# Patient Record
Sex: Female | Born: 1970
Health system: Southern US, Community
[De-identification: ages and names within clinical notes are randomized; demographics above are authoritative.]

## PROBLEM LIST (undated history)

## (undated) DIAGNOSIS — Z87442 Personal history of urinary calculi: Secondary | ICD-10-CM

## (undated) DIAGNOSIS — F419 Anxiety disorder, unspecified: Secondary | ICD-10-CM

## (undated) DIAGNOSIS — J189 Pneumonia, unspecified organism: Secondary | ICD-10-CM

## (undated) DIAGNOSIS — N200 Calculus of kidney: Secondary | ICD-10-CM

## (undated) DIAGNOSIS — G43909 Migraine, unspecified, not intractable, without status migrainosus: Secondary | ICD-10-CM

## (undated) DIAGNOSIS — K2210 Ulcer of esophagus without bleeding: Secondary | ICD-10-CM

## (undated) DIAGNOSIS — K449 Diaphragmatic hernia without obstruction or gangrene: Secondary | ICD-10-CM

## (undated) DIAGNOSIS — D649 Anemia, unspecified: Secondary | ICD-10-CM

## (undated) DIAGNOSIS — K589 Irritable bowel syndrome without diarrhea: Secondary | ICD-10-CM

## (undated) DIAGNOSIS — K649 Unspecified hemorrhoids: Secondary | ICD-10-CM

## (undated) DIAGNOSIS — J309 Allergic rhinitis, unspecified: Secondary | ICD-10-CM

## (undated) DIAGNOSIS — M797 Fibromyalgia: Secondary | ICD-10-CM

## (undated) DIAGNOSIS — F32A Depression, unspecified: Secondary | ICD-10-CM

## (undated) DIAGNOSIS — K311 Adult hypertrophic pyloric stenosis: Secondary | ICD-10-CM

## (undated) DIAGNOSIS — T17908A Unspecified foreign body in respiratory tract, part unspecified causing other injury, initial encounter: Secondary | ICD-10-CM

## (undated) DIAGNOSIS — S92001A Unspecified fracture of right calcaneus, initial encounter for closed fracture: Secondary | ICD-10-CM

## (undated) DIAGNOSIS — F429 Obsessive-compulsive disorder, unspecified: Secondary | ICD-10-CM

## (undated) DIAGNOSIS — N3020 Other chronic cystitis without hematuria: Secondary | ICD-10-CM

## (undated) DIAGNOSIS — J449 Chronic obstructive pulmonary disease, unspecified: Secondary | ICD-10-CM

## (undated) DIAGNOSIS — N3010 Interstitial cystitis (chronic) without hematuria: Secondary | ICD-10-CM

## (undated) DIAGNOSIS — K219 Gastro-esophageal reflux disease without esophagitis: Secondary | ICD-10-CM

## (undated) DIAGNOSIS — E039 Hypothyroidism, unspecified: Secondary | ICD-10-CM

## (undated) DIAGNOSIS — F329 Major depressive disorder, single episode, unspecified: Secondary | ICD-10-CM

## (undated) DIAGNOSIS — R4182 Altered mental status, unspecified: Secondary | ICD-10-CM

## (undated) DIAGNOSIS — G8929 Other chronic pain: Secondary | ICD-10-CM

## (undated) DIAGNOSIS — E559 Vitamin D deficiency, unspecified: Secondary | ICD-10-CM

## (undated) DIAGNOSIS — M791 Myalgia, unspecified site: Secondary | ICD-10-CM

## (undated) DIAGNOSIS — D509 Iron deficiency anemia, unspecified: Secondary | ICD-10-CM

## (undated) DIAGNOSIS — K259 Gastric ulcer, unspecified as acute or chronic, without hemorrhage or perforation: Secondary | ICD-10-CM

## (undated) DIAGNOSIS — K648 Other hemorrhoids: Secondary | ICD-10-CM

## (undated) HISTORY — DX: Other chronic cystitis without hematuria: N30.20

## (undated) HISTORY — DX: Calculus of kidney: N20.0

## (undated) HISTORY — PX: PARTIAL GASTRECTOMY: SHX2172

## (undated) HISTORY — PX: OTHER SURGICAL HISTORY: SHX169

## (undated) HISTORY — DX: Gastric ulcer, unspecified as acute or chronic, without hemorrhage or perforation: K25.9

## (undated) HISTORY — DX: Gastro-esophageal reflux disease without esophagitis: K21.9

## (undated) HISTORY — DX: Hypothyroidism, unspecified: E03.9

## (undated) HISTORY — DX: Unspecified hemorrhoids: K64.9

## (undated) HISTORY — DX: Major depressive disorder, single episode, unspecified: F32.9

## (undated) HISTORY — DX: Myalgia, unspecified site: M79.10

## (undated) HISTORY — DX: Other hemorrhoids: K64.8

## (undated) HISTORY — DX: Depression, unspecified: F32.A

## (undated) HISTORY — DX: Diaphragmatic hernia without obstruction or gangrene: K44.9

## (undated) HISTORY — DX: Obsessive-compulsive disorder, unspecified: F42.9

## (undated) HISTORY — PX: ABDOMINAL HYSTERECTOMY: SHX81

## (undated) HISTORY — DX: Anemia, unspecified: D64.9

## (undated) HISTORY — DX: Allergic rhinitis, unspecified: J30.9

## (undated) HISTORY — DX: Migraine, unspecified, not intractable, without status migrainosus: G43.909

## (undated) HISTORY — DX: Irritable bowel syndrome, unspecified: K58.9

## (undated) HISTORY — PX: FRACTURE SURGERY: SHX138

## (undated) HISTORY — DX: Iron deficiency anemia, unspecified: D50.9

## (undated) HISTORY — DX: Adult hypertrophic pyloric stenosis: K31.1

## (undated) HISTORY — DX: Vitamin D deficiency, unspecified: E55.9

## (undated) HISTORY — PX: EXTERNAL EAR SURGERY: SHX627

## (undated) HISTORY — DX: Anxiety disorder, unspecified: F41.9

## (undated) HISTORY — DX: Ulcer of esophagus without bleeding: K22.10

---

## 1998-02-14 ENCOUNTER — Emergency Department (HOSPITAL_COMMUNITY): Admission: EM | Admit: 1998-02-14 | Discharge: 1998-02-14 | Payer: Self-pay | Admitting: Emergency Medicine

## 1998-02-14 ENCOUNTER — Encounter: Payer: Self-pay | Admitting: Emergency Medicine

## 1998-09-03 ENCOUNTER — Other Ambulatory Visit: Admission: RE | Admit: 1998-09-03 | Discharge: 1998-09-03 | Payer: Self-pay | Admitting: *Deleted

## 1999-05-12 ENCOUNTER — Other Ambulatory Visit: Admission: RE | Admit: 1999-05-12 | Discharge: 1999-05-12 | Payer: Self-pay | Admitting: Family Medicine

## 1999-10-07 ENCOUNTER — Ambulatory Visit (HOSPITAL_COMMUNITY): Admission: RE | Admit: 1999-10-07 | Discharge: 1999-10-07 | Payer: Self-pay | Admitting: *Deleted

## 1999-10-07 ENCOUNTER — Encounter: Payer: Self-pay | Admitting: *Deleted

## 2000-02-01 ENCOUNTER — Emergency Department (HOSPITAL_COMMUNITY): Admission: EM | Admit: 2000-02-01 | Discharge: 2000-02-02 | Payer: Self-pay | Admitting: Emergency Medicine

## 2000-02-01 ENCOUNTER — Encounter: Payer: Self-pay | Admitting: Emergency Medicine

## 2000-02-03 ENCOUNTER — Emergency Department (HOSPITAL_COMMUNITY): Admission: EM | Admit: 2000-02-03 | Discharge: 2000-02-03 | Payer: Self-pay | Admitting: Emergency Medicine

## 2000-02-06 ENCOUNTER — Ambulatory Visit (HOSPITAL_COMMUNITY): Admission: RE | Admit: 2000-02-06 | Discharge: 2000-02-06 | Payer: Self-pay | Admitting: Urology

## 2003-01-24 ENCOUNTER — Other Ambulatory Visit: Admission: RE | Admit: 2003-01-24 | Discharge: 2003-01-24 | Payer: Self-pay | Admitting: Family Medicine

## 2004-08-13 ENCOUNTER — Ambulatory Visit: Payer: Self-pay | Admitting: Internal Medicine

## 2004-09-29 ENCOUNTER — Inpatient Hospital Stay (HOSPITAL_COMMUNITY): Admission: RE | Admit: 2004-09-29 | Discharge: 2004-09-30 | Payer: Self-pay | Admitting: Psychiatry

## 2004-09-29 ENCOUNTER — Ambulatory Visit: Payer: Self-pay | Admitting: Psychiatry

## 2004-10-01 ENCOUNTER — Other Ambulatory Visit (HOSPITAL_COMMUNITY): Admission: RE | Admit: 2004-10-01 | Discharge: 2004-12-30 | Payer: Self-pay | Admitting: Psychiatry

## 2005-04-08 ENCOUNTER — Ambulatory Visit (HOSPITAL_BASED_OUTPATIENT_CLINIC_OR_DEPARTMENT_OTHER): Admission: RE | Admit: 2005-04-08 | Discharge: 2005-04-08 | Payer: Self-pay | Admitting: Orthopedic Surgery

## 2007-03-07 ENCOUNTER — Emergency Department (HOSPITAL_COMMUNITY): Admission: EM | Admit: 2007-03-07 | Discharge: 2007-03-07 | Payer: Self-pay | Admitting: Emergency Medicine

## 2007-03-11 ENCOUNTER — Ambulatory Visit (HOSPITAL_COMMUNITY): Admission: RE | Admit: 2007-03-11 | Discharge: 2007-03-11 | Payer: Self-pay | Admitting: Urology

## 2007-10-24 ENCOUNTER — Ambulatory Visit (HOSPITAL_BASED_OUTPATIENT_CLINIC_OR_DEPARTMENT_OTHER): Admission: RE | Admit: 2007-10-24 | Discharge: 2007-10-24 | Payer: Self-pay | Admitting: Urology

## 2007-10-24 ENCOUNTER — Encounter (INDEPENDENT_AMBULATORY_CARE_PROVIDER_SITE_OTHER): Payer: Self-pay | Admitting: Urology

## 2010-05-27 NOTE — Op Note (Signed)
[name], [name]                ACCOUNT NO.:  [id number]   MEDICAL RECORD NO.:  [id number]          PATIENT TYPE:  AMB   LOCATION:  [facility]                         FACILITY:  [facility]   PHYSICIAN:  Mark C. Vernie Ammons, M.D.  DATE OF BIRTH:  10/15/1970   DATE OF PROCEDURE:  10/24/2007  DATE OF DISCHARGE:                               OPERATIVE REPORT   PREOPERATIVE DIAGNOSIS:  Rule out interstitial cystitis.   POSTOPERATIVE DIAGNOSIS:  Probable interstitial cystitis.   PROCEDURE:  1. Cystoscopy.  2. Hydrodistention.  3. Urethral dilatation.  4. Bladder biopsy.  5. Injection of local anesthetic/steroid.   SURGEON:  Mark C. Vernie Ammons, M.D.   ANESTHESIA:  General.   SPECIMENS:  Cold cup biopsies from the left wall and right bladder base  to pathology.   DRAINS:  None.   BLOOD LOSS:  Minimal.   COMPLICATIONS:  None.   INDICATIONS:  The patient is a 40 year old with symptoms suggesting  possible interstitial cystitis.  She has been having a lot of frequency  and bladder discomfort as well as discomfort with intercourse.  She had  had a previous evaluation for this condition with cystoscopy and bladder  biopsy by Dr. Harvin Hazel on December 23, 2006.  At that time his  cystoscopic examination indicated there were areas of denuded bladder  mucosa near the trigone with associated ulcerations.  These areas were  treated with a laser and cauterized as well as biopsied.  The biopsy  revealed what appeared to be fungal elements consistent with  candidiasis.  She was subsequently found to have a left distal ureteral  stone that was felt to be causing her irritation and after removal of  that stone, she seemed to improve but her symptoms have recurred and now  persist despite anticholinergic therapy.  We discussed further  evaluation under anesthesia as planned today.  The risks, complications  and alternatives were discussed.  She understands and has elected to  proceed.   DESCRIPTION OF  OPERATION:  After informed consent, the patient brought  to the major OR, placed on the table, administered general anesthesia  then moved to the dorsal lithotomy position.  Her genitalia and vagina  were sterilely prepped and draped and an official time-out was then  performed.  Initially the 22-French cystoscope sheath with 12 degrees  lens was inserted and the urethra was noted to be normal.  Upon entering  the bladder, I note the mucosa appeared entirely normal without evidence  of ulceration or areas of abnormality.  There was a shallow diverticulum  on the wall of the bladder on the right-hand side inferiorly.  The  bladder was fully inspected with both 12 and 70 degrees lenses and no  tumors, stones or inflammatory lesions were identified.  Ureteral  orifices were noted to be of normal configuration and position.   I then performed hydrodistention by filling the bladder with 100 cm  water pressure and then drained the bladder.  The bladder capacity was  noted to be 700 mL and the terminal effluent was bloody.  Reinspection  revealed significant glomerulations and petechial  hemorrhages involving  the lateral walls and floor of the bladder.  These were photographed..   I then removed the cystoscope and inserted the 24-French Foley catheter  and performed hydrodistention under 100 cm water pressure for 5 minutes  and then redrained the bladder.  This time the bladder was noted to have  held 800 mL.  Reinspection revealed no perforation or injury to the  bladder.  I therefore proceeded with bladder biopsies.   Cold cup biopsy forceps were used to obtain a biopsy from the left wall  and floor of the bladder just to the right of midline.  The biopsies  sites were then fulgurated with the Bugbee electrode and no bleeding was  identified.  The bladder was then drained and urethral dilatation was  then performed.  The urethra was dilated with female sounds to 32-French  without  difficulty.   I then instilled a solution of Pyridium, Marcaine and Kenalog in the  bladder.  10 mL of the Kenalog and 0.5% Marcaine mixture was then used  and injected in the subtrigonal region through the anterior wall of the  vagina in a systematic fashion.  It was also injected periurethrally and  at the bladder neck region.  Two rolled gauze sponges were then placed  in the bladder for tamponade purposes and the bladder was drained and  the patient was awakened and taken to recovery room in stable  satisfactory condition.  She tolerated procedure well and her vaginal  packing will be removed prior to discharge.   It is apparent that the patient most likely has interstitial cystitis in  its early form without decrease ilioinguinal nerve bladder capacity to a  significant degree at this time.  I therefore will initiate Elmiron  therapy at 100 mg taking 2 pills b.i.d. as well as Elavil 25 mg one  q.h.s. #30.  She was given Vicodin ES #24 and Pyridium 200 mg #24 to  take for the discomfort of the procedure and will follow-up in my office  in 3-4 weeks for reevaluation.      Mark C. Vernie Ammons, M.D.  Electronically Signed     MCO/MEDQ  D:  10/24/2007  T:  10/24/2007  Job:  [id number]

## 2010-05-27 NOTE — Op Note (Signed)
[name], [name]                ACCOUNT NO.:  [id number]   MEDICAL RECORD NO.:  [id number]          PATIENT TYPE:  AMB   LOCATION:  DAY                          FACILITY:  [facility]   PHYSICIAN:  Mark C. Vernie Ammons, M.D.  DATE OF BIRTH:  [birth year]   DATE OF PROCEDURE:  03/11/2007  DATE OF DISCHARGE:                               OPERATIVE REPORT   PREOPERATIVE DIAGNOSIS:  [name] and history of bladder  lesion.   POSTOPERATIVE DIAGNOSIS:  [name] and history of bladder  lesion.   PROCEDURE:  1. Cystoscopy.  2. Laura retrograde pyelogram with interpretation.  3. Laura ureteroscopy.  4. Ureteral [name] extraction.   SURGEON:  Mark C. Vernie Ammons, M.D.   ANESTHESIA:  General.   SPECIMEN:  [name] given to the patient.   DRAINS:  None.   BLOOD LOSS:  None.   COMPLICATIONS:  None.   INDICATIONS:  The patient is a 40 year old white female who has been  plagued by irritative voiding symptoms that were felt likely to be due  to what was initially diagnosed as a fungal cystitis by biopsy of the  bladder.  This was done elsewhere and she was treated with an antifungal  and noted some improvement but then continued to have significant  irritative voiding symptoms not improved with anticholinergics.  She  then developed severe Laura flank pain, presented to the emergency room  and was found to have a Laura distal ureteral calculus that was 2 mm in  size.  The patient has had a prior 3 mm calculus that would not pass and  required extraction in the past.  We therefore discussed ureteroscopic  extraction of this [name], its risks and complications as well as  alternatives.  She understands and would like to proceed with surgery.   DESCRIPTION OF OPERATION:  After informed consent, the patient was  brought to the main OR and placed on the table and administered general  anesthesia and placed in the dorsal lithotomy position.  Genitalia were  sterilely prepped and draped  after which an official time-out was  performed.   Cystoscopy was performed using a 22 French cystoscope and 12 degree  lens.  I note the bladder appears entirely normal with no evidence of  tumor, stones or inflammatory lesions.  Ureteral orifices were of normal  position but fairly tiny.  There were no worrisome erythematous lesions,  growths or other abnormality whatsoever of the bladder noted.  There was  some slight squamous trigonal metaplasia seen.   A 6 French ureteral catheter was then passed through the cystoscope and  I attempted to pass this into the Laura ureteral orifice but it was so  small it would not accept a 6 [place] open-ended catheter.  I was able to  place the catheter against the ureteral orifice and injected contrast  under direct fluoroscopy.  Full strength contrast was used and it  revealed the distal ureter with a filling defect consistent with the  distal ureteral [name].  No other abnormalities of the ureter were noted.   A 0.038 inch floppy tip  guidewire was then passed through the open-end  catheter, into the Laura ureteral orifice and up the Laura ureter for  several centimeters and Laura in place with the cystoscope being removed.  Over the guidewire I then passed the inner cannula of the ureteral  access sheath to gently dilate the distal intramural ureter.  After this  was performed I was then able to leave the guidewire in place and insert  the 6 French rigid ureteroscope.   I passed the ureteroscope gently through the Laura orifice which it  passed through easily.  In the distal Laura ureter I was able to identify  the [name], photographed it and then grasped it with a Nitinol basket and  extracted that with the ureteroscope.  I then drained the bladder and  the patient was awakened and taken to recovery in stable satisfactory  condition.  She tolerated the procedure well with no intraoperative  complications and she received a B and O suppository at the  end of the  procedure.   She will be transferred to the recovery room where she will be given a  dose of Pyridium Plus and a dose of Flomax and then be discharged home  with written instructions and a prescription for Pyridium Plus #24 and  will follow up in my office in 7-10 days.      Mark C. Vernie Ammons, M.D.  Electronically Signed     MCO/MEDQ  D:  03/11/2007  T:  03/12/2007  Job:  [id number]

## 2010-05-30 NOTE — Op Note (Signed)
NAMENETTA, FODGE                ACCOUNT NO.:  [id number]   MEDICAL RECORD NO.:  [id number]          PATIENT TYPE:  AMB   LOCATION:  [facility]                          FACILITY:  [facility]   PHYSICIAN:  Cindee Salt, M.D.       DATE OF BIRTH:  [birth year]   DATE OF PROCEDURE:  04/08/2005  DATE OF DISCHARGE:                                 OPERATIVE REPORT   PREOPERATIVE DIAGNOSIS:  Carpal tunnel syndrome right hand.   POSTOPERATIVE DIAGNOSIS:  Carpal tunnel syndrome right hand.   OPERATION:  Decompression right median nerve.   SURGEON:  Cindee Salt, M.D.   ASSISTANT:  Joaquin Courts R.N.   ANESTHESIA:  Forearm based IV regional.   HISTORY:  The patient is a 40 year old female with history of carpal tunnel  syndrome.  EMG nerve conductions positive which has not responded to  conservative treatment.   PROCEDURE:  The patient is brought to the operating room where forearm based  IV regional anesthetic was carried out without difficulty.  She was prepped  using DuraPrep, supine position, right arm free.  A longitudinal incision  was made in the palm, carried down through subcutaneous tissue.  Bleeders  were electrocauterized, palmar fascia was split, superficial palmar arch  identified.  The flexor tendon of the ring and little finger identified.  To  the ulnar side of median nerve carpal retinaculum was incised with sharp  dissection.  A right angle and Sewall retractor were placed between skin and  forearm fascia.  The fascia released for approximately 1.5 cm proximal to  the wrist crease under direct vision.  Compression of hyperemia to the nerve  was immediately apparent.  No further lesions were identified.  Tenosynovium  tissue was moderately thickened.  The wound was irrigated.  Skin was closed  with interrupted 5-0 nylon sutures.  Sterile compressive dressing and splint  was applied.  The patient tolerated the procedure well and was taken to the  recovery room for observation in  satisfactory condition.  She is discharged  home to return [facility] in one week on Vicodin.           ______________________________  Cindee Salt, M.D.     GK/MEDQ  D:  04/08/2005  T:  04/09/2005  Job:  [id number]

## 2010-05-30 NOTE — Discharge Summary (Signed)
[name], [name]                ACCOUNT NO.:  [id number]   MEDICAL RECORD NO.:  [id number]          PATIENT TYPE:  IPS   LOCATION:  0301                          FACILITY:  [facility]   PHYSICIAN:  Jeanice Lim, M.D. DATE OF BIRTH:  04-02-1970   DATE OF ADMISSION:  09/29/2004  DATE OF DISCHARGE:  09/30/2004                                 DISCHARGE SUMMARY   IDENTIFYING DATA:  This is a 40 year old Caucasian female, married,  voluntarily admitted.  Mother of one.  Reporting two panic attacks on  Sunday.  Anxiety getting worse and panic attacks began three weeks ago  secondary to mother and caregiver stress.  Mother argues with father.  The  patient gets overwhelmed.  Feels life is miserable.  Had vague suicidal  thoughts prior to admission.  None at this time.  The patient followed up by  Dr. Betti Cruz.  First inpatient treatment.  Married with a 37-year-old son.  Husband supportive.   MEDICATIONS:  Zoloft (has been on 200 mg for two months) and Risperdal (but  did not fill this).   ALLERGIES:  No known drug allergies.   PHYSICAL EXAMINATION:  Physical and neurologic exam within normal limits.   LABORATORY DATA:  Routine admission labs within normal limits.   MENTAL STATUS EXAM:  Fully alert, cooperative, mostly pleasant.  Speech  within normal limits.  Mood depressed, some anxiety.  Thought processes with  no paranoia or psychotic symptoms.  No dangerous ideation.  Cognitively  intact.  Judgment and insight fair.   ADMISSION DIAGNOSES:  AXIS I:  Major depressive disorder, recurrent, severe.  Obsessive-compulsive disorder.  Panic disorder not otherwise specified.  AXIS II:  Deferred.  AXIS III:  Irritable bowel syndrome and migraine headaches.  AXIS IV:  Moderate (stress with support system).  AXIS V:  45/75.   HOSPITAL COURSE:  The patient was admitted and ordered routine p.r.n.  medications.  Was optimized on Xanax.  Casemanagement contacted husband and  discharge planning and  intensive outpatient was arranged.   CONDITION ON DISCHARGE:  The patient was discharged in improved condition  with no dangerous ideation, tolerating recommendations and motivated to  participate fully in aftercare plan.  Given medication education.  To follow  up with primary care physician.   DISCHARGE MEDICATIONS:  1.  Continue Xanax 0.25 mg, 1 at bedtime and 1 in the morning and 1 at 1      p.m. with food.  2.  Zoloft 200 mg daily.  3.  Risperdal 0.5 mg at bedtime.  4.  Ultram, resume taking as prescribed by primary care physician.   FOLLOW UP:  The patient is to follow up with mental health outpatient  appointment at [facility] on October 01, 2004 at 9 a.m.   DISCHARGE DIAGNOSES:  AXIS I:  Major depressive disorder, recurrent, severe.  Obsessive-compulsive disorder.  Panic disorder not otherwise specified.  AXIS II:  Deferred.  AXIS III:  Irritable bowel syndrome and migraine headaches.  AXIS IV:  Moderate (stress with support system).  AXIS V:  GAF on discharge 55.  Jeanice Lim, M.D.  Electronically Signed     JEM/MEDQ  D:  11/10/2004  T:  11/11/2004  Job:  [id number]

## 2010-05-30 NOTE — Op Note (Signed)
[facility]  Patient:    [name], [name]                       MRN: [id number] Proc. Date: 02/06/00 Adm. Date:  [id number] Attending:  Londell Moh                           Operative Report  PREOPERATIVE DIAGNOSIS:  Right ureteral calculus.  POSTOPERATIVE DIAGNOSIS: Right ureteral calculus.  OPERATION: 1. Cystoscopy. 2. Right retrograde. 3. Right ureteroscopy with basket extraction. 4. Right JJ catheter insertion.  SURGEON:  Jamison Neighbor, M.D.  ANESTHESIA:  General.  COMPLICATIONS:  None.  DRAIN: None.  BRIEF HISTORY:  This 40 year old female has presented to the emergency room on four separate occasions complaining of pain on the right-hand side.  She finally had a CT scan obtained which showed a 3 mm stone in the distal right ureter.  The patient was told that this would likely pass, but with her pain being so severe, she required additional therapy.  She has tearfully requested the stone be removed.  The patient understands the risks and benefits of the procedure and gave full and informed consent.  DESCRIPTION OF PROCEDURE:  After successful induction of general anesthesia, the patient was placed in the dorsolithotomy position, prepped with Betadine, and draped in the usual sterile fashion.  Cystoscopy was performed.  The bladder was carefully inspected.  No tumor was thought to be seen in the bladder.  The left ureteral orifice was normal.  The right ureteral orifice was quite edematous likely due to stone impaction in the distal ureter.  A guidewire was passed, and retrograde study showed no real hydronephrosis. Because the ureter was extremely edematous and tight, the distal ureter was successfully dilated with the balloon dilator.  After dilation, ureteroscope was inserted.  The patient had stone material that was somewhat crushed and in pieces.  This was flushed out, but there were no true fragments that could be sent for  analysis.  The ureteroscope was advanced all the way to the kidney, and no additional stone material could be seen.  Because the ureter was very edematous from the longstanding impaction, decision was made to place a stent. A 24 x 4.5 stent was passed over the guidewire and allowed to coil normally in the kidney.  The string was used to position this in appropriate position. This will be taped to the thigh and will be removed next week.  The CT scan showed no evidence of any stone on the other side, and it was felt that some of the patients pain may be simply referred pain, and it was not felt that ureteroscopy was required on the left-hand side.  The bladder was drained. The patient was given intraoperative Toradol.  She tolerated the procedure well and was taken to the recovery room in good condition.  She will be given a prescription for Lorcet Plus to take as needed for pain pre and postoperative management of burning and spasms and will be covered with antibiotics until the stent is removed. DD:  02/06/00 TD:  02/07/00 Job: [id number]

## 2010-10-03 LAB — URINALYSIS, ROUTINE W REFLEX MICROSCOPIC
Bilirubin Urine: NEGATIVE
Glucose, UA: NEGATIVE
Leukocytes, UA: NEGATIVE
Nitrite: NEGATIVE
Protein, ur: NEGATIVE
Specific Gravity, Urine: 1.041 — ABNORMAL HIGH
Urobilinogen, UA: 0.2
pH: 5.5

## 2010-10-03 LAB — URINE MICROSCOPIC-ADD ON

## 2010-10-03 LAB — HEMOGLOBIN AND HEMATOCRIT, BLOOD
HCT: 41.9
Hemoglobin: 14.3

## 2010-10-03 LAB — PREGNANCY, URINE: Preg Test, Ur: NEGATIVE

## 2010-10-13 LAB — POCT HEMOGLOBIN-HEMACUE: Hemoglobin: 13.7

## 2010-10-13 LAB — POCT PREGNANCY, URINE: Preg Test, Ur: NEGATIVE

## 2011-06-15 ENCOUNTER — Other Ambulatory Visit: Payer: Self-pay | Admitting: Family Medicine

## 2011-06-15 DIAGNOSIS — N644 Mastodynia: Secondary | ICD-10-CM

## 2012-05-11 DIAGNOSIS — N9489 Other specified conditions associated with female genital organs and menstrual cycle: Secondary | ICD-10-CM | POA: Diagnosis not present

## 2012-05-11 DIAGNOSIS — R350 Frequency of micturition: Secondary | ICD-10-CM | POA: Diagnosis not present

## 2012-05-11 DIAGNOSIS — R300 Dysuria: Secondary | ICD-10-CM | POA: Diagnosis not present

## 2012-05-11 DIAGNOSIS — R109 Unspecified abdominal pain: Secondary | ICD-10-CM | POA: Diagnosis not present

## 2012-05-11 DIAGNOSIS — IMO0002 Reserved for concepts with insufficient information to code with codable children: Secondary | ICD-10-CM | POA: Diagnosis not present

## 2012-05-11 DIAGNOSIS — K589 Irritable bowel syndrome without diarrhea: Secondary | ICD-10-CM | POA: Diagnosis not present

## 2012-05-11 DIAGNOSIS — N3010 Interstitial cystitis (chronic) without hematuria: Secondary | ICD-10-CM | POA: Diagnosis not present

## 2012-05-11 DIAGNOSIS — R3915 Urgency of urination: Secondary | ICD-10-CM | POA: Diagnosis not present

## 2012-06-01 DIAGNOSIS — K589 Irritable bowel syndrome without diarrhea: Secondary | ICD-10-CM | POA: Diagnosis not present

## 2012-06-01 DIAGNOSIS — IMO0002 Reserved for concepts with insufficient information to code with codable children: Secondary | ICD-10-CM | POA: Diagnosis not present

## 2012-07-29 DIAGNOSIS — R634 Abnormal weight loss: Secondary | ICD-10-CM | POA: Diagnosis not present

## 2012-07-29 DIAGNOSIS — K589 Irritable bowel syndrome without diarrhea: Secondary | ICD-10-CM | POA: Diagnosis not present

## 2012-08-05 DIAGNOSIS — K589 Irritable bowel syndrome without diarrhea: Secondary | ICD-10-CM | POA: Diagnosis not present

## 2012-08-05 DIAGNOSIS — K591 Functional diarrhea: Secondary | ICD-10-CM | POA: Diagnosis not present

## 2012-08-05 DIAGNOSIS — R197 Diarrhea, unspecified: Secondary | ICD-10-CM | POA: Diagnosis not present

## 2012-08-05 DIAGNOSIS — Z8371 Family history of colonic polyps: Secondary | ICD-10-CM | POA: Diagnosis not present

## 2012-08-05 DIAGNOSIS — R634 Abnormal weight loss: Secondary | ICD-10-CM | POA: Diagnosis not present

## 2012-08-05 DIAGNOSIS — K648 Other hemorrhoids: Secondary | ICD-10-CM | POA: Diagnosis not present

## 2012-08-05 DIAGNOSIS — R1032 Left lower quadrant pain: Secondary | ICD-10-CM | POA: Diagnosis not present

## 2012-08-05 DIAGNOSIS — D126 Benign neoplasm of colon, unspecified: Secondary | ICD-10-CM | POA: Diagnosis not present

## 2012-08-16 DIAGNOSIS — N390 Urinary tract infection, site not specified: Secondary | ICD-10-CM | POA: Diagnosis not present

## 2012-08-16 DIAGNOSIS — N3010 Interstitial cystitis (chronic) without hematuria: Secondary | ICD-10-CM | POA: Diagnosis not present

## 2012-08-16 DIAGNOSIS — R350 Frequency of micturition: Secondary | ICD-10-CM | POA: Diagnosis not present

## 2012-08-16 DIAGNOSIS — R3915 Urgency of urination: Secondary | ICD-10-CM | POA: Diagnosis not present

## 2012-09-08 DIAGNOSIS — H60399 Other infective otitis externa, unspecified ear: Secondary | ICD-10-CM | POA: Diagnosis not present

## 2012-09-17 DIAGNOSIS — H60509 Unspecified acute noninfective otitis externa, unspecified ear: Secondary | ICD-10-CM | POA: Diagnosis not present

## 2012-09-17 DIAGNOSIS — H9209 Otalgia, unspecified ear: Secondary | ICD-10-CM | POA: Diagnosis not present

## 2012-10-17 DIAGNOSIS — Z23 Encounter for immunization: Secondary | ICD-10-CM | POA: Diagnosis not present

## 2012-10-17 DIAGNOSIS — H60399 Other infective otitis externa, unspecified ear: Secondary | ICD-10-CM | POA: Diagnosis not present

## 2012-10-24 DIAGNOSIS — R350 Frequency of micturition: Secondary | ICD-10-CM | POA: Diagnosis not present

## 2012-11-18 DIAGNOSIS — N3010 Interstitial cystitis (chronic) without hematuria: Secondary | ICD-10-CM | POA: Diagnosis not present

## 2012-11-18 DIAGNOSIS — R3915 Urgency of urination: Secondary | ICD-10-CM | POA: Diagnosis not present

## 2012-11-18 DIAGNOSIS — R350 Frequency of micturition: Secondary | ICD-10-CM | POA: Diagnosis not present

## 2012-11-18 DIAGNOSIS — IMO0001 Reserved for inherently not codable concepts without codable children: Secondary | ICD-10-CM | POA: Diagnosis not present

## 2012-12-05 DIAGNOSIS — R350 Frequency of micturition: Secondary | ICD-10-CM | POA: Diagnosis not present

## 2012-12-22 DIAGNOSIS — R350 Frequency of micturition: Secondary | ICD-10-CM | POA: Diagnosis not present

## 2013-02-07 DIAGNOSIS — IMO0002 Reserved for concepts with insufficient information to code with codable children: Secondary | ICD-10-CM | POA: Diagnosis not present

## 2013-02-07 DIAGNOSIS — F3289 Other specified depressive episodes: Secondary | ICD-10-CM | POA: Diagnosis not present

## 2013-02-07 DIAGNOSIS — J069 Acute upper respiratory infection, unspecified: Secondary | ICD-10-CM | POA: Diagnosis not present

## 2013-02-07 DIAGNOSIS — H9209 Otalgia, unspecified ear: Secondary | ICD-10-CM | POA: Diagnosis not present

## 2013-02-07 DIAGNOSIS — H6120 Impacted cerumen, unspecified ear: Secondary | ICD-10-CM | POA: Diagnosis not present

## 2013-02-07 DIAGNOSIS — F329 Major depressive disorder, single episode, unspecified: Secondary | ICD-10-CM | POA: Diagnosis not present

## 2013-02-15 DIAGNOSIS — J342 Deviated nasal septum: Secondary | ICD-10-CM | POA: Diagnosis not present

## 2013-02-15 DIAGNOSIS — H9209 Otalgia, unspecified ear: Secondary | ICD-10-CM | POA: Diagnosis not present

## 2013-02-15 DIAGNOSIS — H903 Sensorineural hearing loss, bilateral: Secondary | ICD-10-CM | POA: Diagnosis not present

## 2013-02-15 DIAGNOSIS — H9190 Unspecified hearing loss, unspecified ear: Secondary | ICD-10-CM | POA: Diagnosis not present

## 2013-02-15 DIAGNOSIS — D164 Benign neoplasm of bones of skull and face: Secondary | ICD-10-CM | POA: Diagnosis not present

## 2013-02-15 DIAGNOSIS — H9319 Tinnitus, unspecified ear: Secondary | ICD-10-CM | POA: Diagnosis not present

## 2013-02-23 DIAGNOSIS — H9190 Unspecified hearing loss, unspecified ear: Secondary | ICD-10-CM | POA: Diagnosis not present

## 2013-02-23 DIAGNOSIS — D164 Benign neoplasm of bones of skull and face: Secondary | ICD-10-CM | POA: Diagnosis not present

## 2013-02-23 DIAGNOSIS — H9209 Otalgia, unspecified ear: Secondary | ICD-10-CM | POA: Diagnosis not present

## 2013-03-03 DIAGNOSIS — N3010 Interstitial cystitis (chronic) without hematuria: Secondary | ICD-10-CM | POA: Diagnosis not present

## 2013-03-03 DIAGNOSIS — H6040 Cholesteatoma of external ear, unspecified ear: Secondary | ICD-10-CM | POA: Diagnosis not present

## 2013-03-30 DIAGNOSIS — F329 Major depressive disorder, single episode, unspecified: Secondary | ICD-10-CM | POA: Diagnosis not present

## 2013-03-30 DIAGNOSIS — Z1331 Encounter for screening for depression: Secondary | ICD-10-CM | POA: Diagnosis not present

## 2013-03-30 DIAGNOSIS — F410 Panic disorder [episodic paroxysmal anxiety] without agoraphobia: Secondary | ICD-10-CM | POA: Diagnosis not present

## 2013-03-30 DIAGNOSIS — IMO0002 Reserved for concepts with insufficient information to code with codable children: Secondary | ICD-10-CM | POA: Diagnosis not present

## 2013-03-30 DIAGNOSIS — F3289 Other specified depressive episodes: Secondary | ICD-10-CM | POA: Diagnosis not present

## 2013-03-31 DIAGNOSIS — H6040 Cholesteatoma of external ear, unspecified ear: Secondary | ICD-10-CM | POA: Diagnosis not present

## 2013-03-31 DIAGNOSIS — H9209 Otalgia, unspecified ear: Secondary | ICD-10-CM | POA: Diagnosis not present

## 2013-04-12 DIAGNOSIS — Z881 Allergy status to other antibiotic agents status: Secondary | ICD-10-CM | POA: Diagnosis not present

## 2013-04-12 DIAGNOSIS — H7190 Unspecified cholesteatoma, unspecified ear: Secondary | ICD-10-CM | POA: Diagnosis not present

## 2013-04-12 DIAGNOSIS — K589 Irritable bowel syndrome without diarrhea: Secondary | ICD-10-CM | POA: Diagnosis not present

## 2013-04-12 DIAGNOSIS — H61309 Acquired stenosis of external ear canal, unspecified, unspecified ear: Secondary | ICD-10-CM | POA: Diagnosis not present

## 2013-04-12 DIAGNOSIS — H6040 Cholesteatoma of external ear, unspecified ear: Secondary | ICD-10-CM | POA: Diagnosis not present

## 2013-04-12 DIAGNOSIS — G8929 Other chronic pain: Secondary | ICD-10-CM | POA: Diagnosis not present

## 2013-04-12 DIAGNOSIS — N3010 Interstitial cystitis (chronic) without hematuria: Secondary | ICD-10-CM | POA: Diagnosis not present

## 2013-04-12 DIAGNOSIS — Z833 Family history of diabetes mellitus: Secondary | ICD-10-CM | POA: Diagnosis not present

## 2013-04-12 DIAGNOSIS — M5137 Other intervertebral disc degeneration, lumbosacral region: Secondary | ICD-10-CM | POA: Diagnosis not present

## 2013-04-12 DIAGNOSIS — F17200 Nicotine dependence, unspecified, uncomplicated: Secondary | ICD-10-CM | POA: Diagnosis not present

## 2013-04-12 DIAGNOSIS — Z809 Family history of malignant neoplasm, unspecified: Secondary | ICD-10-CM | POA: Diagnosis not present

## 2013-04-12 DIAGNOSIS — Z8249 Family history of ischemic heart disease and other diseases of the circulatory system: Secondary | ICD-10-CM | POA: Diagnosis not present

## 2013-04-21 DIAGNOSIS — H7190 Unspecified cholesteatoma, unspecified ear: Secondary | ICD-10-CM | POA: Diagnosis not present

## 2013-05-26 DIAGNOSIS — N3010 Interstitial cystitis (chronic) without hematuria: Secondary | ICD-10-CM | POA: Diagnosis not present

## 2013-06-21 DIAGNOSIS — N3010 Interstitial cystitis (chronic) without hematuria: Secondary | ICD-10-CM | POA: Diagnosis not present

## 2013-06-21 DIAGNOSIS — N9489 Other specified conditions associated with female genital organs and menstrual cycle: Secondary | ICD-10-CM | POA: Diagnosis not present

## 2013-06-30 DIAGNOSIS — IMO0002 Reserved for concepts with insufficient information to code with codable children: Secondary | ICD-10-CM | POA: Diagnosis not present

## 2013-06-30 DIAGNOSIS — M545 Low back pain, unspecified: Secondary | ICD-10-CM | POA: Diagnosis not present

## 2013-06-30 DIAGNOSIS — S20219A Contusion of unspecified front wall of thorax, initial encounter: Secondary | ICD-10-CM | POA: Diagnosis not present

## 2013-06-30 DIAGNOSIS — S20229A Contusion of unspecified back wall of thorax, initial encounter: Secondary | ICD-10-CM | POA: Diagnosis not present

## 2013-06-30 DIAGNOSIS — S298XXA Other specified injuries of thorax, initial encounter: Secondary | ICD-10-CM | POA: Diagnosis not present

## 2013-06-30 DIAGNOSIS — W010XXA Fall on same level from slipping, tripping and stumbling without subsequent striking against object, initial encounter: Secondary | ICD-10-CM | POA: Diagnosis not present

## 2013-06-30 DIAGNOSIS — F17200 Nicotine dependence, unspecified, uncomplicated: Secondary | ICD-10-CM | POA: Diagnosis not present

## 2013-07-01 DIAGNOSIS — IMO0002 Reserved for concepts with insufficient information to code with codable children: Secondary | ICD-10-CM | POA: Diagnosis not present

## 2013-07-01 DIAGNOSIS — S298XXA Other specified injuries of thorax, initial encounter: Secondary | ICD-10-CM | POA: Diagnosis not present

## 2013-07-01 DIAGNOSIS — M545 Low back pain, unspecified: Secondary | ICD-10-CM | POA: Diagnosis not present

## 2013-07-04 DIAGNOSIS — M549 Dorsalgia, unspecified: Secondary | ICD-10-CM | POA: Diagnosis not present

## 2013-07-04 DIAGNOSIS — IMO0002 Reserved for concepts with insufficient information to code with codable children: Secondary | ICD-10-CM | POA: Diagnosis not present

## 2013-07-05 DIAGNOSIS — N9489 Other specified conditions associated with female genital organs and menstrual cycle: Secondary | ICD-10-CM | POA: Diagnosis not present

## 2013-07-05 DIAGNOSIS — N3010 Interstitial cystitis (chronic) without hematuria: Secondary | ICD-10-CM | POA: Diagnosis not present

## 2013-09-21 DIAGNOSIS — H7190 Unspecified cholesteatoma, unspecified ear: Secondary | ICD-10-CM | POA: Diagnosis not present

## 2013-10-23 DIAGNOSIS — Z9889 Other specified postprocedural states: Secondary | ICD-10-CM | POA: Diagnosis not present

## 2013-10-23 DIAGNOSIS — R3915 Urgency of urination: Secondary | ICD-10-CM | POA: Diagnosis not present

## 2013-10-23 DIAGNOSIS — T83110A Breakdown (mechanical) of urinary electronic stimulator device, initial encounter: Secondary | ICD-10-CM | POA: Diagnosis not present

## 2013-10-23 DIAGNOSIS — T85111A Breakdown (mechanical) of implanted electronic neurostimulator (electrode) of peripheral nerve, initial encounter: Secondary | ICD-10-CM | POA: Diagnosis not present

## 2013-10-23 DIAGNOSIS — R350 Frequency of micturition: Secondary | ICD-10-CM | POA: Diagnosis not present

## 2014-02-15 DIAGNOSIS — F329 Major depressive disorder, single episode, unspecified: Secondary | ICD-10-CM | POA: Diagnosis not present

## 2014-02-15 DIAGNOSIS — K589 Irritable bowel syndrome without diarrhea: Secondary | ICD-10-CM | POA: Diagnosis not present

## 2014-02-15 DIAGNOSIS — R634 Abnormal weight loss: Secondary | ICD-10-CM | POA: Diagnosis not present

## 2014-02-15 DIAGNOSIS — L03011 Cellulitis of right finger: Secondary | ICD-10-CM | POA: Diagnosis not present

## 2014-02-28 DIAGNOSIS — E876 Hypokalemia: Secondary | ICD-10-CM | POA: Diagnosis not present

## 2014-02-28 DIAGNOSIS — D72829 Elevated white blood cell count, unspecified: Secondary | ICD-10-CM | POA: Diagnosis not present

## 2014-03-19 DIAGNOSIS — J029 Acute pharyngitis, unspecified: Secondary | ICD-10-CM | POA: Diagnosis not present

## 2014-03-19 DIAGNOSIS — Z789 Other specified health status: Secondary | ICD-10-CM | POA: Diagnosis not present

## 2014-03-19 DIAGNOSIS — K120 Recurrent oral aphthae: Secondary | ICD-10-CM | POA: Diagnosis not present

## 2014-03-19 DIAGNOSIS — K219 Gastro-esophageal reflux disease without esophagitis: Secondary | ICD-10-CM | POA: Diagnosis not present

## 2014-03-27 DIAGNOSIS — R350 Frequency of micturition: Secondary | ICD-10-CM | POA: Diagnosis not present

## 2014-04-18 DIAGNOSIS — R600 Localized edema: Secondary | ICD-10-CM | POA: Diagnosis not present

## 2014-04-18 DIAGNOSIS — M79604 Pain in right leg: Secondary | ICD-10-CM | POA: Diagnosis not present

## 2014-04-18 DIAGNOSIS — N3010 Interstitial cystitis (chronic) without hematuria: Secondary | ICD-10-CM | POA: Diagnosis not present

## 2014-04-24 DIAGNOSIS — F17210 Nicotine dependence, cigarettes, uncomplicated: Secondary | ICD-10-CM | POA: Diagnosis not present

## 2014-04-24 DIAGNOSIS — S52691A Other fracture of lower end of right ulna, initial encounter for closed fracture: Secondary | ICD-10-CM | POA: Diagnosis not present

## 2014-04-24 DIAGNOSIS — S5291XA Unspecified fracture of right forearm, initial encounter for closed fracture: Secondary | ICD-10-CM | POA: Diagnosis not present

## 2014-04-24 DIAGNOSIS — S52501A Unspecified fracture of the lower end of right radius, initial encounter for closed fracture: Secondary | ICD-10-CM | POA: Diagnosis not present

## 2014-04-24 DIAGNOSIS — S52611A Displaced fracture of right ulna styloid process, initial encounter for closed fracture: Secondary | ICD-10-CM | POA: Diagnosis not present

## 2014-04-24 DIAGNOSIS — S52202A Unspecified fracture of shaft of left ulna, initial encounter for closed fracture: Secondary | ICD-10-CM | POA: Diagnosis not present

## 2014-04-24 DIAGNOSIS — S52591A Other fractures of lower end of right radius, initial encounter for closed fracture: Secondary | ICD-10-CM | POA: Diagnosis not present

## 2014-04-26 DIAGNOSIS — S52501A Unspecified fracture of the lower end of right radius, initial encounter for closed fracture: Secondary | ICD-10-CM | POA: Diagnosis not present

## 2014-05-02 DIAGNOSIS — M810 Age-related osteoporosis without current pathological fracture: Secondary | ICD-10-CM | POA: Diagnosis not present

## 2014-05-02 DIAGNOSIS — S52501A Unspecified fracture of the lower end of right radius, initial encounter for closed fracture: Secondary | ICD-10-CM | POA: Diagnosis not present

## 2014-05-02 DIAGNOSIS — S52601A Unspecified fracture of lower end of right ulna, initial encounter for closed fracture: Secondary | ICD-10-CM | POA: Diagnosis not present

## 2014-05-02 DIAGNOSIS — Z0389 Encounter for observation for other suspected diseases and conditions ruled out: Secondary | ICD-10-CM | POA: Diagnosis not present

## 2014-05-02 DIAGNOSIS — Z79899 Other long term (current) drug therapy: Secondary | ICD-10-CM | POA: Diagnosis not present

## 2014-05-02 DIAGNOSIS — F17210 Nicotine dependence, cigarettes, uncomplicated: Secondary | ICD-10-CM | POA: Diagnosis not present

## 2014-05-02 DIAGNOSIS — M797 Fibromyalgia: Secondary | ICD-10-CM | POA: Diagnosis not present

## 2014-05-02 DIAGNOSIS — S52571A Other intraarticular fracture of lower end of right radius, initial encounter for closed fracture: Secondary | ICD-10-CM | POA: Diagnosis not present

## 2014-05-02 DIAGNOSIS — Z01810 Encounter for preprocedural cardiovascular examination: Secondary | ICD-10-CM | POA: Diagnosis not present

## 2014-05-02 DIAGNOSIS — K589 Irritable bowel syndrome without diarrhea: Secondary | ICD-10-CM | POA: Diagnosis not present

## 2014-05-22 DIAGNOSIS — M25631 Stiffness of right wrist, not elsewhere classified: Secondary | ICD-10-CM | POA: Diagnosis not present

## 2014-05-22 DIAGNOSIS — M25431 Effusion, right wrist: Secondary | ICD-10-CM | POA: Diagnosis not present

## 2014-05-22 DIAGNOSIS — M25531 Pain in right wrist: Secondary | ICD-10-CM | POA: Diagnosis not present

## 2014-06-12 DIAGNOSIS — S52501D Unspecified fracture of the lower end of right radius, subsequent encounter for closed fracture with routine healing: Secondary | ICD-10-CM | POA: Diagnosis not present

## 2014-06-15 DIAGNOSIS — F329 Major depressive disorder, single episode, unspecified: Secondary | ICD-10-CM | POA: Diagnosis not present

## 2014-06-15 DIAGNOSIS — R600 Localized edema: Secondary | ICD-10-CM | POA: Diagnosis not present

## 2014-06-15 DIAGNOSIS — R634 Abnormal weight loss: Secondary | ICD-10-CM | POA: Diagnosis not present

## 2014-07-04 DIAGNOSIS — H6091 Unspecified otitis externa, right ear: Secondary | ICD-10-CM | POA: Diagnosis not present

## 2014-07-04 DIAGNOSIS — Z681 Body mass index (BMI) 19 or less, adult: Secondary | ICD-10-CM | POA: Diagnosis not present

## 2014-07-20 DIAGNOSIS — H6611 Chronic tubotympanic suppurative otitis media, right ear: Secondary | ICD-10-CM | POA: Diagnosis not present

## 2014-07-20 DIAGNOSIS — H9201 Otalgia, right ear: Secondary | ICD-10-CM | POA: Diagnosis not present

## 2014-08-20 DIAGNOSIS — H9201 Otalgia, right ear: Secondary | ICD-10-CM | POA: Diagnosis not present

## 2014-08-20 DIAGNOSIS — H6611 Chronic tubotympanic suppurative otitis media, right ear: Secondary | ICD-10-CM | POA: Diagnosis not present

## 2014-10-09 DIAGNOSIS — F329 Major depressive disorder, single episode, unspecified: Secondary | ICD-10-CM | POA: Diagnosis not present

## 2014-10-09 DIAGNOSIS — Z79899 Other long term (current) drug therapy: Secondary | ICD-10-CM | POA: Diagnosis not present

## 2014-10-09 DIAGNOSIS — D509 Iron deficiency anemia, unspecified: Secondary | ICD-10-CM | POA: Diagnosis not present

## 2014-10-09 DIAGNOSIS — R634 Abnormal weight loss: Secondary | ICD-10-CM | POA: Diagnosis not present

## 2014-10-09 DIAGNOSIS — K529 Noninfective gastroenteritis and colitis, unspecified: Secondary | ICD-10-CM | POA: Diagnosis not present

## 2014-10-09 DIAGNOSIS — Z6824 Body mass index (BMI) 24.0-24.9, adult: Secondary | ICD-10-CM | POA: Diagnosis not present

## 2014-12-13 DIAGNOSIS — R8290 Unspecified abnormal findings in urine: Secondary | ICD-10-CM | POA: Diagnosis not present

## 2014-12-13 DIAGNOSIS — R350 Frequency of micturition: Secondary | ICD-10-CM | POA: Diagnosis not present

## 2014-12-13 DIAGNOSIS — N3010 Interstitial cystitis (chronic) without hematuria: Secondary | ICD-10-CM | POA: Diagnosis not present

## 2014-12-13 DIAGNOSIS — R300 Dysuria: Secondary | ICD-10-CM | POA: Diagnosis not present

## 2014-12-13 DIAGNOSIS — R3915 Urgency of urination: Secondary | ICD-10-CM | POA: Diagnosis not present

## 2014-12-13 DIAGNOSIS — K580 Irritable bowel syndrome with diarrhea: Secondary | ICD-10-CM | POA: Diagnosis not present

## 2014-12-28 DIAGNOSIS — H6611 Chronic tubotympanic suppurative otitis media, right ear: Secondary | ICD-10-CM | POA: Diagnosis not present

## 2014-12-28 DIAGNOSIS — H9311 Tinnitus, right ear: Secondary | ICD-10-CM | POA: Diagnosis not present

## 2015-02-07 DIAGNOSIS — N390 Urinary tract infection, site not specified: Secondary | ICD-10-CM | POA: Diagnosis not present

## 2015-02-07 DIAGNOSIS — Z681 Body mass index (BMI) 19 or less, adult: Secondary | ICD-10-CM | POA: Diagnosis not present

## 2015-03-25 DIAGNOSIS — Z6820 Body mass index (BMI) 20.0-20.9, adult: Secondary | ICD-10-CM | POA: Diagnosis not present

## 2015-03-25 DIAGNOSIS — F419 Anxiety disorder, unspecified: Secondary | ICD-10-CM | POA: Diagnosis not present

## 2015-03-25 DIAGNOSIS — Z1389 Encounter for screening for other disorder: Secondary | ICD-10-CM | POA: Diagnosis not present

## 2015-03-25 DIAGNOSIS — R6889 Other general symptoms and signs: Secondary | ICD-10-CM | POA: Diagnosis not present

## 2015-03-25 DIAGNOSIS — D509 Iron deficiency anemia, unspecified: Secondary | ICD-10-CM | POA: Diagnosis not present

## 2015-03-25 DIAGNOSIS — H6091 Unspecified otitis externa, right ear: Secondary | ICD-10-CM | POA: Diagnosis not present

## 2015-04-05 DIAGNOSIS — Z1212 Encounter for screening for malignant neoplasm of rectum: Secondary | ICD-10-CM | POA: Diagnosis not present

## 2015-05-29 DIAGNOSIS — Z79899 Other long term (current) drug therapy: Secondary | ICD-10-CM | POA: Diagnosis not present

## 2015-05-29 DIAGNOSIS — Z6821 Body mass index (BMI) 21.0-21.9, adult: Secondary | ICD-10-CM | POA: Diagnosis not present

## 2015-05-29 DIAGNOSIS — R030 Elevated blood-pressure reading, without diagnosis of hypertension: Secondary | ICD-10-CM | POA: Diagnosis not present

## 2015-05-29 DIAGNOSIS — R5383 Other fatigue: Secondary | ICD-10-CM | POA: Diagnosis not present

## 2015-05-29 DIAGNOSIS — F329 Major depressive disorder, single episode, unspecified: Secondary | ICD-10-CM | POA: Diagnosis not present

## 2015-05-29 DIAGNOSIS — D509 Iron deficiency anemia, unspecified: Secondary | ICD-10-CM | POA: Diagnosis not present

## 2015-06-18 DIAGNOSIS — R8290 Unspecified abnormal findings in urine: Secondary | ICD-10-CM | POA: Diagnosis not present

## 2015-08-15 DIAGNOSIS — N390 Urinary tract infection, site not specified: Secondary | ICD-10-CM | POA: Diagnosis not present

## 2015-08-15 DIAGNOSIS — Z6820 Body mass index (BMI) 20.0-20.9, adult: Secondary | ICD-10-CM | POA: Diagnosis not present

## 2015-08-15 DIAGNOSIS — D509 Iron deficiency anemia, unspecified: Secondary | ICD-10-CM | POA: Diagnosis not present

## 2015-08-19 ENCOUNTER — Telehealth: Payer: Self-pay | Admitting: Hematology

## 2015-08-19 NOTE — Telephone Encounter (Signed)
Appointment scheduled with [name] on 8/9 at 12pm. Patient aware to arrive 30 minutes early. Demographics verified. Letter to the referring.

## 2015-08-21 ENCOUNTER — Ambulatory Visit: Payer: Medicare Other | Admitting: Hematology

## 2015-09-05 ENCOUNTER — Ambulatory Visit (HOSPITAL_BASED_OUTPATIENT_CLINIC_OR_DEPARTMENT_OTHER): Payer: Medicare Other

## 2015-09-05 ENCOUNTER — Ambulatory Visit (HOSPITAL_BASED_OUTPATIENT_CLINIC_OR_DEPARTMENT_OTHER): Payer: Medicare Other | Admitting: Hematology

## 2015-09-05 ENCOUNTER — Encounter: Payer: Self-pay | Admitting: Hematology

## 2015-09-05 ENCOUNTER — Telehealth: Payer: Self-pay | Admitting: Hematology

## 2015-09-05 VITALS — BP 129/74 | HR 85 | Temp 99.0°F | Resp 17 | Ht 59.0 in | Wt 98.0 lb

## 2015-09-05 DIAGNOSIS — D500 Iron deficiency anemia secondary to blood loss (chronic): Secondary | ICD-10-CM

## 2015-09-05 DIAGNOSIS — D509 Iron deficiency anemia, unspecified: Secondary | ICD-10-CM | POA: Insufficient documentation

## 2015-09-05 DIAGNOSIS — E611 Iron deficiency: Secondary | ICD-10-CM | POA: Diagnosis not present

## 2015-09-05 DIAGNOSIS — E538 Deficiency of other specified B group vitamins: Secondary | ICD-10-CM

## 2015-09-05 DIAGNOSIS — D649 Anemia, unspecified: Secondary | ICD-10-CM | POA: Insufficient documentation

## 2015-09-05 DIAGNOSIS — K219 Gastro-esophageal reflux disease without esophagitis: Secondary | ICD-10-CM | POA: Insufficient documentation

## 2015-09-05 DIAGNOSIS — F32A Depression, unspecified: Secondary | ICD-10-CM | POA: Insufficient documentation

## 2015-09-05 DIAGNOSIS — K589 Irritable bowel syndrome without diarrhea: Secondary | ICD-10-CM | POA: Insufficient documentation

## 2015-09-05 DIAGNOSIS — N3010 Interstitial cystitis (chronic) without hematuria: Secondary | ICD-10-CM | POA: Insufficient documentation

## 2015-09-05 DIAGNOSIS — F329 Major depressive disorder, single episode, unspecified: Secondary | ICD-10-CM | POA: Insufficient documentation

## 2015-09-05 LAB — CBC & DIFF AND RETIC
BASO%: 0.2 % (ref 0.0–2.0)
Basophils Absolute: 0 10*3/uL (ref 0.0–0.1)
EOS ABS: 0.2 10*3/uL (ref 0.0–0.5)
EOS%: 1.7 % (ref 0.0–7.0)
HCT: 27.1 % — ABNORMAL LOW (ref 34.8–46.6)
HEMOGLOBIN: 7.4 g/dL — AB (ref 11.6–15.9)
IMMATURE RETIC FRACT: 28.7 % — AB (ref 1.60–10.00)
LYMPH#: 3 10*3/uL (ref 0.9–3.3)
LYMPH%: 27.4 % (ref 14.0–49.7)
MCH: 19.6 pg — ABNORMAL LOW (ref 25.1–34.0)
MCHC: 27.3 g/dL — ABNORMAL LOW (ref 31.5–36.0)
MCV: 71.9 fL — AB (ref 79.5–101.0)
MONO#: 0.9 10*3/uL (ref 0.1–0.9)
MONO%: 8 % (ref 0.0–14.0)
NEUT%: 62.7 % (ref 38.4–76.8)
NEUTROS ABS: 6.9 10*3/uL — AB (ref 1.5–6.5)
Platelets: 241 10*3/uL (ref 145–400)
RBC: 3.77 10*6/uL (ref 3.70–5.45)
RDW: 20.3 % — ABNORMAL HIGH (ref 11.2–14.5)
RETIC CT ABS: 52.03 10*3/uL (ref 33.70–90.70)
Retic %: 1.38 % (ref 0.70–2.10)
WBC: 11.1 10*3/uL — AB (ref 3.9–10.3)

## 2015-09-05 LAB — COMPREHENSIVE METABOLIC PANEL
ALBUMIN: 2.8 g/dL — AB (ref 3.5–5.0)
ALK PHOS: 78 U/L (ref 40–150)
AST: 10 U/L (ref 5–34)
Anion Gap: 8 mEq/L (ref 3–11)
BUN: 10.1 mg/dL (ref 7.0–26.0)
CO2: 22 meq/L (ref 22–29)
CREATININE: 0.7 mg/dL (ref 0.6–1.1)
Calcium: 8.7 mg/dL (ref 8.4–10.4)
Chloride: 112 mEq/L — ABNORMAL HIGH (ref 98–109)
GLUCOSE: 87 mg/dL (ref 70–140)
Potassium: 4 mEq/L (ref 3.5–5.1)
SODIUM: 142 meq/L (ref 136–145)
TOTAL PROTEIN: 6 g/dL — AB (ref 6.4–8.3)

## 2015-09-05 LAB — IRON AND TIBC
Iron: <11 ug/dL — ABNORMAL LOW (ref 41–142)
TIBC: 406 ug/dL (ref 236–444)

## 2015-09-05 LAB — FERRITIN: Ferritin: 4 ng/ml — ABNORMAL LOW (ref 9–269)

## 2015-09-05 MED ORDER — B COMPLEX VITAMINS PO CAPS: 1.0000 | ORAL_CAPSULE | Freq: Every day | ORAL | Status: DC

## 2015-09-05 NOTE — Progress Notes (Signed)
Marland Kitchen    HEMATOLOGY/ONCOLOGY CONSULTATION NOTE  Date of Service: 09/05/2015  Patient Care Team: Cyndi Bender, PA-C as PCP - General (Physician Assistant)  CHIEF COMPLAINTS/PURPOSE OF CONSULTATION:  Severe and deficiency anemia  HISTORY OF PRESENTING ILLNESS:   [name] is a wonderful 45 y.o. female who has been referred to [place] by Dr .Cyndi Bender, PA-C  for evaluation and management of severe iron deficiency anemia with a hemoglobin of 7.7 and a ferritin of 5 with intolerance of oral iron.  Patient has a history of chronic interstitial cystitis with chronic hematuria and discomfort, chronic iron deficiency anemia, GERD with chronic PPI therapy, migraines, irritable bowel syndrome, hemorrhoids was had issues with iron deficiency for several years. Her hemoglobin was 7.5 in March 2017, 7.8 in May 2017 and her ferritin has been less than 10 on several occasions. She has tried oral iron supplementation which she has had difficulties tolerating due to GI upset. She notes significant chronic fatigue. Has previously had a colonoscopy about 3 years ago with Dr. Lyndel Safe at [facility] which showed some polyps. She does not report having an EGD AND endoscopy. She reports that she had a fecal occult blood testing about 2-3 months ago which was negative. She has been on PPIs for a few years for GERD and this could cause an iron absorption problem.  Patient notes significant fatigue and some ice cravings. No dizziness no chest pain and no overt shortness of breath. Some DOE. Present mother has a history of pernicious anemia with B12 deficiency. Does not report any clinically overt gluten intolerance.   MEDICAL HISTORY:   #1 iron deficiency anemia #2 tobacco abuse #3GERD #4 chronic interstitial cystitis leading to chronic suprapubic discomfort - patient is on disability for this. He follows up with Dr. Amalia Hailey at [facility] #5 migraine #6 anxiety disorder #7 palpitations #8  depression #9 irritable bowel syndrome #10 hemorrhoids #11 allergic rhinitis  SURGICAL HISTORY: #1 hysterectomy in 2010 with bilateral salpingo-oophorectomy for endometriosis and heavy menstrual bleeding #2 multiple surgeries for interstitial cystitis including implanted TENS unit #3 Ear surgery on the right ear  SOCIAL HISTORY: Social History   Social History  . Marital status: Married    Spouse name: N/A  . Number of children: N/A  . Years of education: N/A   Occupational History  . Not on file.   Social History Main Topics  . Smoking status: Not on file  . Smokeless tobacco: Not on file  . Alcohol use Not on file  . Drug use: Unknown  . Sexual activity: Not on file   Other Topics Concern  . Not on file   Social History Narrative  . No narrative on file  current every day smoker half a pack per day. Denies significant alcohol use Denies other drug use  FAMILY HISTORY: Mother had a history of B12 deficiency due to pernicious anemia Mother congestive heart failure and deep venous thrombosis Father hypertension   ALLERGIES:  is allergic to sulfamethoxazole and xanax xr [alprazolam er].  MEDICATIONS:  Current Outpatient Prescriptions  Medication Sig Dispense Refill  . gabapentin (NEURONTIN) 300 MG capsule Take 300 mg by mouth.    . traMADol (ULTRAM) 50 MG tablet 1-2 po q6 hr prn pain    . Aspirin-Salicylamide-Caffeine (BC HEADACHE PO) Take by mouth.     No current facility-administered medications for this visit.     REVIEW OF SYSTEMS:    10 Point review of Systems was done is negative  except as noted above.  PHYSICAL EXAMINATION: ECOG PERFORMANCE STATUS: 2 - Symptomatic, <50% confined to bed  . Vitals:   09/05/15 1136  BP: 129/74  Pulse: 85  Resp: 17  Temp: 99 F (37.2 C)   Filed Weights   09/05/15 1136  Weight: 98 lb (44.5 kg)   .Body mass index is 19.79 kg/m.  GENERAL:alert, in no acute distress and comfortable SKIN: skin color,  texture, turgor are normal, no rashes or significant lesions EYES: normal, conjunctiva pallor ++, sclera clear OROPHARYNX:no exudate, no erythema and lips, buccal mucosa, and tongue normal  NECK: supple, no JVD, thyroid normal size, non-tender, without nodularity LYMPH:  no palpable lymphadenopathy in the cervical, axillary or inguinal LUNGS: clear to auscultation with normal respiratory effort HEART: regular rate & rhythm,  no murmurs and no lower extremity edema ABDOMEN: abdomen soft, mild TTP lower abdomen, normoactive bowel sounds , no palpable hepato-splenoemgaly Musculoskeletal: no cyanosis of digits and no clubbing  PSYCH: alert & oriented x 3 with fluent speech NEURO: no focal motor/sensory deficits  LABORATORY DATA:  I have reviewed the data as listed . CBC Latest Ref Rng & Units 09/05/2015 10/24/2007 03/11/2007  WBC 3.9 - 10.3 10e3/uL 11.1(H) - -  Hemoglobin 11.6 - 15.9 g/dL 7.4(L) 13.7 14.3  Hematocrit 34.8 - 46.6 % 27.1(L) - 41.9  Platelets 145 - 400 10e3/uL 241 - -   . CBC    Component Value Date/Time   WBC 11.1 (H) 09/05/2015 1350   RBC 3.77 09/05/2015 1350   HGB 7.4 (L) 09/05/2015 1350   HCT 27.1 (L) 09/05/2015 1350   PLT 241 09/05/2015 1350   MCV 71.9 (L) 09/05/2015 1350   MCH 19.6 (L) 09/05/2015 1350   MCHC 27.3 (L) 09/05/2015 1350   RDW 20.3 (H) 09/05/2015 1350   LYMPHSABS 3.0 09/05/2015 1350   MONOABS 0.9 09/05/2015 1350   EOSABS 0.2 09/05/2015 1350   BASOSABS 0.0 09/05/2015 1350    . CMP Latest Ref Rng & Units 09/05/2015  Glucose 70 - 140 mg/dl 87  BUN 7.0 - 26.0 mg/dL 10.1  Creatinine 0.6 - 1.1 mg/dL 0.7  Sodium 136 - 145 mEq/L 142  Potassium 3.5 - 5.1 mEq/L 4.0  CO2 22 - 29 mEq/L 22  Calcium 8.4 - 10.4 mg/dL 8.7  Total Protein 6.4 - 8.3 g/dL 6.0(L)  Total Bilirubin 0.20 - 1.20 mg/dL <0.30  Alkaline Phos 40 - 150 U/L 78  AST 5 - 34 U/L 10  ALT 0 - 55 U/L <9   . Lab Results  Component Value Date   IRON <11 (L) 09/05/2015   TIBC 406  09/05/2015   IRONPCTSAT Not calculated due to Iron < linear range. 09/05/2015   (Iron and TIBC)  Lab Results  Component Value Date   FERRITIN 4 (L) 09/05/2015   B12   206   RADIOGRAPHIC STUDIES: I have personally reviewed the radiological images as listed and agreed with the findings in the report. No results found.  ASSESSMENT & PLAN:   45 yo female with   1) chronic microcytic hypochromic anemia related to severe iron deficiency Hemoglobin has been in the mid to low 7 range for about a year with significantly microcytic indices. Today hemoglobin is 7.4 with a ferritin of 2 and iron saturation of less than 10% Patient is symptomatic from this.  #2 severe iron deficiency Is is likely due to chronic urinary losses from hematuria due to chronic interstitial cystitis Patient reports fecal occult blood testing about 2-3  months ago was negative. She has had some intermittent rectal bleeding from hemorrhoids. Colonoscopy 3 years ago had some polyps Likely poor absorption due to chronic PPI therapy  #3 B12 deficiency -likely due to chronic PPI therapy. Mother has a history of pernicious anemia. Would need to rule this out in the patient as well.  #4 intolerance to oral iron PLAN - no indication for acute PRBC transfusion at this time. -Given her oral iron intolerance and severe symptomatic iron deficiency we offered her IV iron replacement. -After discussing the pros and cons and potential adverse effects of IV Feraheme a verbal consent was obtained from the patient. -we'll set her up for IV Feraheme 510 mg every weekly 3 doses given iron deficit of more than 1.5grams. -We will also start her on sublingual B12 thousand micrograms daily and 1 tablet of vitamin B complex daily -We will give her a couple of doses of subcutaneous B12 to avoid precipitating acute deficiency in the setting of IV iron replacement and acceelerated hematopoiesis. -We shall check and antiparietal cell  antibody and anti-intrinsic factor antibody on follow-up to rule out pernicious anemia. -Patient should follow up with her primary care physician to consider additional GI workup with EGD and capsule endoscopy.  Return to care with Dr. Irene Limbo in 8 weeks after IV iron replacement  All of the patients questions were answered with apparent satisfaction. The patient knows to call the clinic with any problems, questions or concerns.  I spent 50 minutes counseling the patient face to face. The total time spent in the appointment was 60 minutes and more than 50% was on counseling and direct patient cares.    Sullivan Lone MD [place] AAHIVMS [facility] Hematology/Oncology Physician [facility]  (Office):       [phone] (Work cell):  [phone] (Fax):           [phone]  09/05/2015 12:06 PM

## 2015-09-05 NOTE — Telephone Encounter (Signed)
Gave patient avs report and appointments for August thru October  °

## 2015-09-06 LAB — VITAMIN B12: Vitamin B12: 206 pg/mL — ABNORMAL LOW (ref 211–946)

## 2015-09-12 ENCOUNTER — Ambulatory Visit: Payer: Medicare Other

## 2015-09-19 ENCOUNTER — Ambulatory Visit: Payer: Medicare Other

## 2015-09-19 ENCOUNTER — Telehealth: Payer: Self-pay | Admitting: Hematology

## 2015-09-19 NOTE — Telephone Encounter (Signed)
09/19/2015 Appointment canceled per patient request. Per patient cannot afford to receive iron infusion.

## 2015-09-26 ENCOUNTER — Ambulatory Visit: Payer: Medicare Other

## 2015-10-15 ENCOUNTER — Telehealth: Payer: Self-pay | Admitting: *Deleted

## 2015-10-15 NOTE — Telephone Encounter (Signed)
-----   Message from Brunetta Genera, MD sent at 10/14/2015  7:32 AM EDT ----- Regarding: IV iron and B12 replacement Hi [name], [name] has not followedup for her IV iron and [place] B12 replacement. It appears she cannot afford it. Could you connect with her to evaluate the issues and see if something can be worked out with financial/SW. Plz make sure she is taking OTC  -iron polysaccharide 150mg  po BID with OJ -B12 sublingual liq 1079mcg daily and -B complex 1 tab po daily   We will need to re-schedule our followup to 6-8 weeks after IV iron replacement.  She will need a CBC, CMP, ferritin, iron profile, B12, parietal cell antibody, intrinsic factor antibody labs on follow-up if you could please place them.  Thanks, GK

## 2015-10-15 NOTE — Telephone Encounter (Signed)
Called patient per staff message. Received no answer. Left patient a voicemail for call back.

## 2015-10-23 DIAGNOSIS — K219 Gastro-esophageal reflux disease without esophagitis: Secondary | ICD-10-CM | POA: Diagnosis not present

## 2015-10-23 DIAGNOSIS — R197 Diarrhea, unspecified: Secondary | ICD-10-CM | POA: Diagnosis not present

## 2015-10-23 DIAGNOSIS — Z681 Body mass index (BMI) 19 or less, adult: Secondary | ICD-10-CM | POA: Diagnosis not present

## 2015-10-23 DIAGNOSIS — Z720 Tobacco use: Secondary | ICD-10-CM | POA: Diagnosis not present

## 2015-10-23 DIAGNOSIS — R1310 Dysphagia, unspecified: Secondary | ICD-10-CM | POA: Diagnosis not present

## 2015-10-23 DIAGNOSIS — F17200 Nicotine dependence, unspecified, uncomplicated: Secondary | ICD-10-CM | POA: Diagnosis not present

## 2015-10-23 DIAGNOSIS — D509 Iron deficiency anemia, unspecified: Secondary | ICD-10-CM | POA: Diagnosis not present

## 2015-10-23 DIAGNOSIS — K589 Irritable bowel syndrome without diarrhea: Secondary | ICD-10-CM | POA: Diagnosis not present

## 2015-10-31 ENCOUNTER — Ambulatory Visit: Payer: Medicare Other | Admitting: Hematology

## 2015-10-31 ENCOUNTER — Telehealth: Payer: Self-pay | Admitting: *Deleted

## 2015-10-31 ENCOUNTER — Other Ambulatory Visit: Payer: Medicare Other

## 2015-10-31 NOTE — Telephone Encounter (Signed)
-----   Message from Joellyn Haff sent at 10/15/2015 12:32 PM EDT ----- Regarding: RE: IV iron and B12 replacement Horald Pollen!  If she's insured there are no copay assistance for iron.  ----- Message ----- From: Ronnette Juniper, RN Sent: 10/15/2015  12:22 PM To: Lenise A White Subject: FW: IV iron and B12 replacement                Hey,     Do you know of any ferhame support that she can receive. Looks like she may have insurance. Tried to contact her to verify but no response.  ----- Message ----- From: Brunetta Genera, MD Sent: 10/14/2015   7:32 AM To: Arty Baumgartner, RN, # Subject: IV iron and B12 replacement                    Hi Darden Dates, Ms Cannoy has not followedup for her IV iron and [place] B12 replacement. It appears she cannot afford it. Could you connect with her to evaluate the issues and see if something can be worked out with financial/SW. Plz make sure she is taking OTC  -iron polysaccharide 150mg  po BID with OJ -B12 sublingual liq 1058mcg daily and -B complex 1 tab po daily   We will need to re-schedule our followup to 6-8 weeks after IV iron replacement.  She will need a CBC, CMP, ferritin, iron profile, B12, parietal cell antibody, intrinsic factor antibody labs on follow-up if you could please place them.  Thanks, GK

## 2015-10-31 NOTE — Telephone Encounter (Signed)
Called patient to inform her that she would not have any co pay assistance. No answer/busy

## 2015-12-17 DIAGNOSIS — N9489 Other specified conditions associated with female genital organs and menstrual cycle: Secondary | ICD-10-CM | POA: Diagnosis not present

## 2015-12-17 DIAGNOSIS — R8290 Unspecified abnormal findings in urine: Secondary | ICD-10-CM | POA: Diagnosis not present

## 2015-12-17 DIAGNOSIS — R300 Dysuria: Secondary | ICD-10-CM | POA: Diagnosis not present

## 2015-12-17 DIAGNOSIS — N3010 Interstitial cystitis (chronic) without hematuria: Secondary | ICD-10-CM | POA: Diagnosis not present

## 2015-12-17 DIAGNOSIS — N941 Unspecified dyspareunia: Secondary | ICD-10-CM | POA: Diagnosis not present

## 2015-12-17 DIAGNOSIS — R1030 Lower abdominal pain, unspecified: Secondary | ICD-10-CM | POA: Diagnosis not present

## 2015-12-17 DIAGNOSIS — D500 Iron deficiency anemia secondary to blood loss (chronic): Secondary | ICD-10-CM | POA: Diagnosis not present

## 2015-12-17 DIAGNOSIS — R310 Gross hematuria: Secondary | ICD-10-CM | POA: Diagnosis not present

## 2015-12-17 DIAGNOSIS — R3915 Urgency of urination: Secondary | ICD-10-CM | POA: Diagnosis not present

## 2015-12-17 DIAGNOSIS — K580 Irritable bowel syndrome with diarrhea: Secondary | ICD-10-CM | POA: Diagnosis not present

## 2015-12-19 ENCOUNTER — Other Ambulatory Visit: Payer: Self-pay | Admitting: Urology

## 2015-12-19 DIAGNOSIS — R310 Gross hematuria: Secondary | ICD-10-CM

## 2015-12-23 ENCOUNTER — Ambulatory Visit
Admission: RE | Admit: 2015-12-23 | Discharge: 2015-12-23 | Disposition: A | Discharge status: Home / Self Care | Payer: Medicare Other | Source: Ambulatory Visit | Attending: Urology | Admitting: Urology

## 2015-12-23 DIAGNOSIS — N200 Calculus of kidney: Secondary | ICD-10-CM | POA: Diagnosis not present

## 2015-12-23 DIAGNOSIS — R310 Gross hematuria: Secondary | ICD-10-CM

## 2015-12-27 DIAGNOSIS — M791 Myalgia: Secondary | ICD-10-CM | POA: Diagnosis not present

## 2015-12-27 DIAGNOSIS — N3011 Interstitial cystitis (chronic) with hematuria: Secondary | ICD-10-CM | POA: Diagnosis not present

## 2015-12-27 DIAGNOSIS — R350 Frequency of micturition: Secondary | ICD-10-CM | POA: Diagnosis not present

## 2015-12-27 DIAGNOSIS — D649 Anemia, unspecified: Secondary | ICD-10-CM | POA: Diagnosis not present

## 2015-12-27 DIAGNOSIS — N3010 Interstitial cystitis (chronic) without hematuria: Secondary | ICD-10-CM | POA: Diagnosis not present

## 2015-12-27 DIAGNOSIS — R3915 Urgency of urination: Secondary | ICD-10-CM | POA: Diagnosis not present

## 2015-12-27 DIAGNOSIS — R310 Gross hematuria: Secondary | ICD-10-CM | POA: Diagnosis not present

## 2015-12-27 DIAGNOSIS — F17210 Nicotine dependence, cigarettes, uncomplicated: Secondary | ICD-10-CM | POA: Diagnosis not present

## 2015-12-31 DIAGNOSIS — R634 Abnormal weight loss: Secondary | ICD-10-CM | POA: Diagnosis not present

## 2015-12-31 DIAGNOSIS — K580 Irritable bowel syndrome with diarrhea: Secondary | ICD-10-CM | POA: Diagnosis not present

## 2015-12-31 DIAGNOSIS — R112 Nausea with vomiting, unspecified: Secondary | ICD-10-CM | POA: Diagnosis not present

## 2015-12-31 DIAGNOSIS — D500 Iron deficiency anemia secondary to blood loss (chronic): Secondary | ICD-10-CM | POA: Diagnosis not present

## 2016-01-13 HISTORY — PX: STOMACH SURGERY: SHX791

## 2016-01-27 DIAGNOSIS — Z681 Body mass index (BMI) 19 or less, adult: Secondary | ICD-10-CM | POA: Diagnosis not present

## 2016-01-27 DIAGNOSIS — D509 Iron deficiency anemia, unspecified: Secondary | ICD-10-CM | POA: Diagnosis not present

## 2016-01-27 DIAGNOSIS — I10 Essential (primary) hypertension: Secondary | ICD-10-CM | POA: Diagnosis not present

## 2016-02-06 DIAGNOSIS — R3915 Urgency of urination: Secondary | ICD-10-CM | POA: Diagnosis not present

## 2016-02-06 DIAGNOSIS — R52 Pain, unspecified: Secondary | ICD-10-CM | POA: Diagnosis not present

## 2016-02-06 DIAGNOSIS — R300 Dysuria: Secondary | ICD-10-CM | POA: Diagnosis not present

## 2016-02-06 DIAGNOSIS — R8290 Unspecified abnormal findings in urine: Secondary | ICD-10-CM | POA: Diagnosis not present

## 2016-02-06 DIAGNOSIS — N941 Unspecified dyspareunia: Secondary | ICD-10-CM | POA: Diagnosis not present

## 2016-02-06 DIAGNOSIS — N3010 Interstitial cystitis (chronic) without hematuria: Secondary | ICD-10-CM | POA: Diagnosis not present

## 2016-03-02 DIAGNOSIS — D509 Iron deficiency anemia, unspecified: Secondary | ICD-10-CM | POA: Diagnosis not present

## 2016-03-02 DIAGNOSIS — E039 Hypothyroidism, unspecified: Secondary | ICD-10-CM | POA: Diagnosis not present

## 2016-03-02 DIAGNOSIS — I10 Essential (primary) hypertension: Secondary | ICD-10-CM | POA: Diagnosis not present

## 2016-03-02 DIAGNOSIS — Z681 Body mass index (BMI) 19 or less, adult: Secondary | ICD-10-CM | POA: Diagnosis not present

## 2016-03-15 DIAGNOSIS — H66009 Acute suppurative otitis media without spontaneous rupture of ear drum, unspecified ear: Secondary | ICD-10-CM | POA: Diagnosis not present

## 2016-05-05 DIAGNOSIS — R112 Nausea with vomiting, unspecified: Secondary | ICD-10-CM | POA: Diagnosis not present

## 2016-05-05 DIAGNOSIS — R1013 Epigastric pain: Secondary | ICD-10-CM | POA: Diagnosis not present

## 2016-05-07 DIAGNOSIS — R52 Pain, unspecified: Secondary | ICD-10-CM | POA: Diagnosis not present

## 2016-05-07 DIAGNOSIS — R310 Gross hematuria: Secondary | ICD-10-CM | POA: Diagnosis not present

## 2016-05-07 DIAGNOSIS — N3010 Interstitial cystitis (chronic) without hematuria: Secondary | ICD-10-CM | POA: Diagnosis not present

## 2016-05-12 DIAGNOSIS — R1013 Epigastric pain: Secondary | ICD-10-CM | POA: Diagnosis not present

## 2016-05-12 DIAGNOSIS — R112 Nausea with vomiting, unspecified: Secondary | ICD-10-CM | POA: Diagnosis not present

## 2016-05-20 DIAGNOSIS — K319 Disease of stomach and duodenum, unspecified: Secondary | ICD-10-CM | POA: Diagnosis not present

## 2016-05-20 DIAGNOSIS — K838 Other specified diseases of biliary tract: Secondary | ICD-10-CM | POA: Diagnosis not present

## 2016-05-20 DIAGNOSIS — I700 Atherosclerosis of aorta: Secondary | ICD-10-CM | POA: Diagnosis not present

## 2016-05-27 DIAGNOSIS — R112 Nausea with vomiting, unspecified: Secondary | ICD-10-CM | POA: Diagnosis not present

## 2016-05-27 DIAGNOSIS — D509 Iron deficiency anemia, unspecified: Secondary | ICD-10-CM | POA: Diagnosis not present

## 2016-05-27 DIAGNOSIS — R634 Abnormal weight loss: Secondary | ICD-10-CM | POA: Diagnosis not present

## 2016-06-01 ENCOUNTER — Other Ambulatory Visit: Payer: Self-pay | Admitting: Urology

## 2016-06-01 DIAGNOSIS — R310 Gross hematuria: Secondary | ICD-10-CM

## 2016-06-05 DIAGNOSIS — K253 Acute gastric ulcer without hemorrhage or perforation: Secondary | ICD-10-CM | POA: Diagnosis not present

## 2016-06-05 DIAGNOSIS — R1013 Epigastric pain: Secondary | ICD-10-CM | POA: Diagnosis not present

## 2016-06-05 DIAGNOSIS — R933 Abnormal findings on diagnostic imaging of other parts of digestive tract: Secondary | ICD-10-CM | POA: Diagnosis not present

## 2016-06-05 DIAGNOSIS — R112 Nausea with vomiting, unspecified: Secondary | ICD-10-CM | POA: Diagnosis not present

## 2016-06-05 DIAGNOSIS — K259 Gastric ulcer, unspecified as acute or chronic, without hemorrhage or perforation: Secondary | ICD-10-CM | POA: Diagnosis not present

## 2016-06-11 DIAGNOSIS — K254 Chronic or unspecified gastric ulcer with hemorrhage: Secondary | ICD-10-CM | POA: Diagnosis not present

## 2016-06-11 DIAGNOSIS — K311 Adult hypertrophic pyloric stenosis: Secondary | ICD-10-CM | POA: Diagnosis not present

## 2016-06-11 DIAGNOSIS — R1013 Epigastric pain: Secondary | ICD-10-CM | POA: Diagnosis not present

## 2016-06-11 DIAGNOSIS — D509 Iron deficiency anemia, unspecified: Secondary | ICD-10-CM | POA: Diagnosis not present

## 2016-06-15 DIAGNOSIS — R0689 Other abnormalities of breathing: Secondary | ICD-10-CM | POA: Diagnosis not present

## 2016-06-15 DIAGNOSIS — N390 Urinary tract infection, site not specified: Secondary | ICD-10-CM | POA: Diagnosis not present

## 2016-06-15 DIAGNOSIS — F419 Anxiety disorder, unspecified: Secondary | ICD-10-CM | POA: Diagnosis not present

## 2016-06-15 DIAGNOSIS — F4323 Adjustment disorder with mixed anxiety and depressed mood: Secondary | ICD-10-CM | POA: Diagnosis not present

## 2016-06-15 DIAGNOSIS — K279 Peptic ulcer, site unspecified, unspecified as acute or chronic, without hemorrhage or perforation: Secondary | ICD-10-CM | POA: Diagnosis not present

## 2016-06-15 DIAGNOSIS — R112 Nausea with vomiting, unspecified: Secondary | ICD-10-CM | POA: Diagnosis not present

## 2016-06-15 DIAGNOSIS — K254 Chronic or unspecified gastric ulcer with hemorrhage: Secondary | ICD-10-CM | POA: Diagnosis present

## 2016-06-15 DIAGNOSIS — R634 Abnormal weight loss: Secondary | ICD-10-CM | POA: Diagnosis not present

## 2016-06-15 DIAGNOSIS — F17210 Nicotine dependence, cigarettes, uncomplicated: Secondary | ICD-10-CM | POA: Diagnosis not present

## 2016-06-15 DIAGNOSIS — E46 Unspecified protein-calorie malnutrition: Secondary | ICD-10-CM | POA: Diagnosis not present

## 2016-06-15 DIAGNOSIS — K259 Gastric ulcer, unspecified as acute or chronic, without hemorrhage or perforation: Secondary | ICD-10-CM | POA: Diagnosis not present

## 2016-06-15 DIAGNOSIS — E876 Hypokalemia: Secondary | ICD-10-CM | POA: Diagnosis not present

## 2016-06-15 DIAGNOSIS — R000 Tachycardia, unspecified: Secondary | ICD-10-CM | POA: Diagnosis not present

## 2016-06-15 DIAGNOSIS — G92 Toxic encephalopathy: Secondary | ICD-10-CM | POA: Diagnosis present

## 2016-06-15 DIAGNOSIS — M797 Fibromyalgia: Secondary | ICD-10-CM | POA: Diagnosis not present

## 2016-06-15 DIAGNOSIS — R0902 Hypoxemia: Secondary | ICD-10-CM | POA: Diagnosis not present

## 2016-06-15 DIAGNOSIS — K219 Gastro-esophageal reflux disease without esophagitis: Secondary | ICD-10-CM | POA: Diagnosis present

## 2016-06-15 DIAGNOSIS — Z882 Allergy status to sulfonamides status: Secondary | ICD-10-CM | POA: Diagnosis not present

## 2016-06-15 DIAGNOSIS — G9340 Encephalopathy, unspecified: Secondary | ICD-10-CM | POA: Diagnosis not present

## 2016-06-15 DIAGNOSIS — K311 Adult hypertrophic pyloric stenosis: Secondary | ICD-10-CM | POA: Diagnosis not present

## 2016-06-15 DIAGNOSIS — Z452 Encounter for adjustment and management of vascular access device: Secondary | ICD-10-CM | POA: Diagnosis not present

## 2016-06-15 DIAGNOSIS — E871 Hypo-osmolality and hyponatremia: Secondary | ICD-10-CM | POA: Diagnosis not present

## 2016-06-15 DIAGNOSIS — N179 Acute kidney failure, unspecified: Secondary | ICD-10-CM | POA: Diagnosis present

## 2016-06-15 DIAGNOSIS — F329 Major depressive disorder, single episode, unspecified: Secondary | ICD-10-CM | POA: Diagnosis present

## 2016-06-15 DIAGNOSIS — J690 Pneumonitis due to inhalation of food and vomit: Secondary | ICD-10-CM | POA: Diagnosis not present

## 2016-06-15 DIAGNOSIS — Z681 Body mass index (BMI) 19 or less, adult: Secondary | ICD-10-CM | POA: Diagnosis not present

## 2016-06-15 DIAGNOSIS — E874 Mixed disorder of acid-base balance: Secondary | ICD-10-CM | POA: Diagnosis present

## 2016-06-15 DIAGNOSIS — K3189 Other diseases of stomach and duodenum: Secondary | ICD-10-CM | POA: Diagnosis not present

## 2016-06-15 DIAGNOSIS — G8929 Other chronic pain: Secondary | ICD-10-CM | POA: Diagnosis not present

## 2016-06-15 DIAGNOSIS — E869 Volume depletion, unspecified: Secondary | ICD-10-CM | POA: Diagnosis not present

## 2016-06-15 DIAGNOSIS — R64 Cachexia: Secondary | ICD-10-CM | POA: Diagnosis present

## 2016-06-15 DIAGNOSIS — I10 Essential (primary) hypertension: Secondary | ICD-10-CM | POA: Diagnosis present

## 2016-06-15 DIAGNOSIS — R319 Hematuria, unspecified: Secondary | ICD-10-CM | POA: Diagnosis not present

## 2016-06-15 DIAGNOSIS — D509 Iron deficiency anemia, unspecified: Secondary | ICD-10-CM | POA: Diagnosis not present

## 2016-06-15 DIAGNOSIS — E162 Hypoglycemia, unspecified: Secondary | ICD-10-CM | POA: Diagnosis present

## 2016-06-15 DIAGNOSIS — R4182 Altered mental status, unspecified: Secondary | ICD-10-CM | POA: Diagnosis not present

## 2016-06-15 DIAGNOSIS — Z903 Acquired absence of stomach [part of]: Secondary | ICD-10-CM | POA: Diagnosis not present

## 2016-06-15 DIAGNOSIS — G9341 Metabolic encephalopathy: Secondary | ICD-10-CM | POA: Diagnosis not present

## 2016-06-22 HISTORY — PX: BILROTH I PROCEDURE: SHX1231

## 2016-07-06 DIAGNOSIS — Z681 Body mass index (BMI) 19 or less, adult: Secondary | ICD-10-CM | POA: Diagnosis not present

## 2016-07-06 DIAGNOSIS — K279 Peptic ulcer, site unspecified, unspecified as acute or chronic, without hemorrhage or perforation: Secondary | ICD-10-CM | POA: Diagnosis not present

## 2016-07-06 DIAGNOSIS — R636 Underweight: Secondary | ICD-10-CM | POA: Diagnosis not present

## 2016-07-06 DIAGNOSIS — K311 Adult hypertrophic pyloric stenosis: Secondary | ICD-10-CM | POA: Diagnosis not present

## 2016-07-06 DIAGNOSIS — Z09 Encounter for follow-up examination after completed treatment for conditions other than malignant neoplasm: Secondary | ICD-10-CM | POA: Diagnosis not present

## 2016-07-06 DIAGNOSIS — Z903 Acquired absence of stomach [part of]: Secondary | ICD-10-CM | POA: Diagnosis not present

## 2016-07-30 DIAGNOSIS — E46 Unspecified protein-calorie malnutrition: Secondary | ICD-10-CM | POA: Diagnosis not present

## 2016-07-30 DIAGNOSIS — F17200 Nicotine dependence, unspecified, uncomplicated: Secondary | ICD-10-CM | POA: Diagnosis not present

## 2016-07-30 DIAGNOSIS — D500 Iron deficiency anemia secondary to blood loss (chronic): Secondary | ICD-10-CM | POA: Diagnosis not present

## 2016-07-30 DIAGNOSIS — R634 Abnormal weight loss: Secondary | ICD-10-CM | POA: Diagnosis not present

## 2016-07-30 DIAGNOSIS — K259 Gastric ulcer, unspecified as acute or chronic, without hemorrhage or perforation: Secondary | ICD-10-CM | POA: Diagnosis not present

## 2016-08-25 DIAGNOSIS — F329 Major depressive disorder, single episode, unspecified: Secondary | ICD-10-CM | POA: Diagnosis not present

## 2016-08-25 DIAGNOSIS — Z681 Body mass index (BMI) 19 or less, adult: Secondary | ICD-10-CM | POA: Diagnosis not present

## 2016-08-25 DIAGNOSIS — R636 Underweight: Secondary | ICD-10-CM | POA: Diagnosis not present

## 2016-10-08 DIAGNOSIS — K259 Gastric ulcer, unspecified as acute or chronic, without hemorrhage or perforation: Secondary | ICD-10-CM | POA: Diagnosis not present

## 2016-10-08 DIAGNOSIS — R1013 Epigastric pain: Secondary | ICD-10-CM | POA: Diagnosis not present

## 2016-10-13 DIAGNOSIS — R1310 Dysphagia, unspecified: Secondary | ICD-10-CM | POA: Diagnosis not present

## 2016-10-13 DIAGNOSIS — R1013 Epigastric pain: Secondary | ICD-10-CM | POA: Diagnosis not present

## 2016-11-11 DIAGNOSIS — F329 Major depressive disorder, single episode, unspecified: Secondary | ICD-10-CM | POA: Diagnosis not present

## 2016-11-11 DIAGNOSIS — Z681 Body mass index (BMI) 19 or less, adult: Secondary | ICD-10-CM | POA: Diagnosis not present

## 2016-11-11 DIAGNOSIS — Z720 Tobacco use: Secondary | ICD-10-CM | POA: Diagnosis not present

## 2016-11-11 DIAGNOSIS — E039 Hypothyroidism, unspecified: Secondary | ICD-10-CM | POA: Diagnosis not present

## 2016-11-11 DIAGNOSIS — E46 Unspecified protein-calorie malnutrition: Secondary | ICD-10-CM | POA: Diagnosis not present

## 2016-11-27 DIAGNOSIS — R112 Nausea with vomiting, unspecified: Secondary | ICD-10-CM | POA: Diagnosis not present

## 2016-11-27 DIAGNOSIS — K259 Gastric ulcer, unspecified as acute or chronic, without hemorrhage or perforation: Secondary | ICD-10-CM | POA: Diagnosis not present

## 2017-01-20 DIAGNOSIS — F329 Major depressive disorder, single episode, unspecified: Secondary | ICD-10-CM | POA: Diagnosis not present

## 2017-01-20 DIAGNOSIS — Z1331 Encounter for screening for depression: Secondary | ICD-10-CM | POA: Diagnosis not present

## 2017-01-20 DIAGNOSIS — K259 Gastric ulcer, unspecified as acute or chronic, without hemorrhage or perforation: Secondary | ICD-10-CM | POA: Diagnosis not present

## 2017-01-20 DIAGNOSIS — E039 Hypothyroidism, unspecified: Secondary | ICD-10-CM | POA: Diagnosis not present

## 2017-01-20 DIAGNOSIS — Z681 Body mass index (BMI) 19 or less, adult: Secondary | ICD-10-CM | POA: Diagnosis not present

## 2017-01-20 DIAGNOSIS — L03011 Cellulitis of right finger: Secondary | ICD-10-CM | POA: Diagnosis not present

## 2017-01-20 DIAGNOSIS — D509 Iron deficiency anemia, unspecified: Secondary | ICD-10-CM | POA: Diagnosis not present

## 2017-01-20 DIAGNOSIS — Z9181 History of falling: Secondary | ICD-10-CM | POA: Diagnosis not present

## 2017-01-20 DIAGNOSIS — E46 Unspecified protein-calorie malnutrition: Secondary | ICD-10-CM | POA: Diagnosis not present

## 2017-01-20 DIAGNOSIS — R112 Nausea with vomiting, unspecified: Secondary | ICD-10-CM | POA: Diagnosis not present

## 2017-02-08 DIAGNOSIS — N3010 Interstitial cystitis (chronic) without hematuria: Secondary | ICD-10-CM | POA: Diagnosis not present

## 2017-02-08 DIAGNOSIS — N9489 Other specified conditions associated with female genital organs and menstrual cycle: Secondary | ICD-10-CM | POA: Diagnosis not present

## 2017-02-08 DIAGNOSIS — F1190 Opioid use, unspecified, uncomplicated: Secondary | ICD-10-CM | POA: Diagnosis not present

## 2017-02-08 DIAGNOSIS — G4700 Insomnia, unspecified: Secondary | ICD-10-CM | POA: Diagnosis not present

## 2017-02-12 DIAGNOSIS — R112 Nausea with vomiting, unspecified: Secondary | ICD-10-CM | POA: Diagnosis not present

## 2017-02-12 DIAGNOSIS — K209 Esophagitis, unspecified: Secondary | ICD-10-CM | POA: Diagnosis not present

## 2017-02-12 DIAGNOSIS — Z903 Acquired absence of stomach [part of]: Secondary | ICD-10-CM | POA: Diagnosis not present

## 2017-02-12 DIAGNOSIS — K589 Irritable bowel syndrome without diarrhea: Secondary | ICD-10-CM | POA: Diagnosis not present

## 2017-02-12 DIAGNOSIS — K2900 Acute gastritis without bleeding: Secondary | ICD-10-CM | POA: Diagnosis not present

## 2017-02-12 DIAGNOSIS — K2970 Gastritis, unspecified, without bleeding: Secondary | ICD-10-CM | POA: Diagnosis not present

## 2017-02-12 DIAGNOSIS — Z79899 Other long term (current) drug therapy: Secondary | ICD-10-CM | POA: Diagnosis not present

## 2017-02-12 DIAGNOSIS — K3189 Other diseases of stomach and duodenum: Secondary | ICD-10-CM | POA: Diagnosis not present

## 2017-02-12 DIAGNOSIS — F17200 Nicotine dependence, unspecified, uncomplicated: Secondary | ICD-10-CM | POA: Diagnosis not present

## 2017-02-12 DIAGNOSIS — K259 Gastric ulcer, unspecified as acute or chronic, without hemorrhage or perforation: Secondary | ICD-10-CM | POA: Diagnosis not present

## 2017-02-12 DIAGNOSIS — R1013 Epigastric pain: Secondary | ICD-10-CM | POA: Diagnosis not present

## 2017-02-12 DIAGNOSIS — D649 Anemia, unspecified: Secondary | ICD-10-CM | POA: Diagnosis not present

## 2017-02-12 DIAGNOSIS — F419 Anxiety disorder, unspecified: Secondary | ICD-10-CM | POA: Diagnosis not present

## 2017-02-12 DIAGNOSIS — F329 Major depressive disorder, single episode, unspecified: Secondary | ICD-10-CM | POA: Diagnosis not present

## 2017-04-01 DIAGNOSIS — K219 Gastro-esophageal reflux disease without esophagitis: Secondary | ICD-10-CM | POA: Diagnosis not present

## 2017-04-01 DIAGNOSIS — D509 Iron deficiency anemia, unspecified: Secondary | ICD-10-CM | POA: Diagnosis not present

## 2017-04-01 DIAGNOSIS — E039 Hypothyroidism, unspecified: Secondary | ICD-10-CM | POA: Diagnosis not present

## 2017-04-01 DIAGNOSIS — Z681 Body mass index (BMI) 19 or less, adult: Secondary | ICD-10-CM | POA: Diagnosis not present

## 2017-04-01 DIAGNOSIS — R112 Nausea with vomiting, unspecified: Secondary | ICD-10-CM | POA: Diagnosis not present

## 2017-04-01 DIAGNOSIS — F329 Major depressive disorder, single episode, unspecified: Secondary | ICD-10-CM | POA: Diagnosis not present

## 2017-04-01 DIAGNOSIS — K259 Gastric ulcer, unspecified as acute or chronic, without hemorrhage or perforation: Secondary | ICD-10-CM | POA: Diagnosis not present

## 2017-04-12 DIAGNOSIS — Z903 Acquired absence of stomach [part of]: Secondary | ICD-10-CM | POA: Diagnosis not present

## 2017-04-12 DIAGNOSIS — D509 Iron deficiency anemia, unspecified: Secondary | ICD-10-CM | POA: Diagnosis not present

## 2017-04-19 DIAGNOSIS — D509 Iron deficiency anemia, unspecified: Secondary | ICD-10-CM | POA: Diagnosis not present

## 2017-04-26 DIAGNOSIS — D509 Iron deficiency anemia, unspecified: Secondary | ICD-10-CM | POA: Diagnosis not present

## 2017-05-18 DIAGNOSIS — S82201A Unspecified fracture of shaft of right tibia, initial encounter for closed fracture: Secondary | ICD-10-CM | POA: Diagnosis not present

## 2017-05-18 DIAGNOSIS — M25571 Pain in right ankle and joints of right foot: Secondary | ICD-10-CM | POA: Diagnosis not present

## 2017-05-18 DIAGNOSIS — S299XXA Unspecified injury of thorax, initial encounter: Secondary | ICD-10-CM | POA: Diagnosis not present

## 2017-05-18 DIAGNOSIS — S82251A Displaced comminuted fracture of shaft of right tibia, initial encounter for closed fracture: Secondary | ICD-10-CM | POA: Diagnosis not present

## 2017-05-18 DIAGNOSIS — S8291XA Unspecified fracture of right lower leg, initial encounter for closed fracture: Secondary | ICD-10-CM | POA: Diagnosis not present

## 2017-05-18 DIAGNOSIS — S82841A Displaced bimalleolar fracture of right lower leg, initial encounter for closed fracture: Secondary | ICD-10-CM | POA: Diagnosis not present

## 2017-05-18 DIAGNOSIS — T148XXA Other injury of unspecified body region, initial encounter: Secondary | ICD-10-CM | POA: Diagnosis not present

## 2017-05-18 DIAGNOSIS — G8929 Other chronic pain: Secondary | ICD-10-CM | POA: Diagnosis not present

## 2017-05-18 DIAGNOSIS — F17210 Nicotine dependence, cigarettes, uncomplicated: Secondary | ICD-10-CM | POA: Diagnosis not present

## 2017-05-24 DIAGNOSIS — S82871A Displaced pilon fracture of right tibia, initial encounter for closed fracture: Secondary | ICD-10-CM | POA: Diagnosis not present

## 2017-06-02 DIAGNOSIS — S92001A Unspecified fracture of right calcaneus, initial encounter for closed fracture: Secondary | ICD-10-CM | POA: Diagnosis not present

## 2017-06-02 DIAGNOSIS — S82871D Displaced pilon fracture of right tibia, subsequent encounter for closed fracture with routine healing: Secondary | ICD-10-CM | POA: Diagnosis not present

## 2017-06-09 DIAGNOSIS — S82871D Displaced pilon fracture of right tibia, subsequent encounter for closed fracture with routine healing: Secondary | ICD-10-CM | POA: Diagnosis not present

## 2017-06-11 NOTE — Pre-Procedure Instructions (Signed)
[name]  06/11/2017      [id number] [address], [place] [address] Phone: [phone] Fax: [phone]    Your procedure is scheduled on Thursday, June 6th   Report to [facility] Admitting at 6:00 AM             (posted surgery time 8a - 12:30p)   Call this number if you have problems the morning of surgery:  [phone]   Remember:              4-5 days prior to surgery, STOP TAKING any Vitamins, Herbal Supplements, Anti-inflammatories, Blood thinners.   Nothing to eat or drink after 12 midnight Wednesday.    Take these medicines the morning of surgery with A SIP OF WATER : Nothing    Do not wear jewelry, make-up or nail polish.  Do not wear lotions, powders, or perfumes, or deodorant.  Do not shave 48 hours prior to surgery.   Do not bring valuables to the hospital.  [facility] is not responsible for any belongings or valuables.  Contacts, dentures or bridgework may not be worn into surgery.  Leave your suitcase in the car.  After surgery it may be brought to your room.  For patients admitted to the hospital, discharge time will be determined by your treatment team.  Please read over the following fact sheets that you were given. Pain Booklet, MRSA Information and Surgical Site Infection Prevention       [place]- Preparing For Surgery  Before surgery, you can play an important role. Because skin is not sterile, your skin needs to be as free of germs as possible. You can reduce the number of germs on your skin by washing with CHG (chlorahexidine gluconate) Soap before surgery.  CHG is an antiseptic cleaner which kills germs and bonds with the skin to continue killing germs even after washing.    Oral Hygiene is also important to reduce your risk of infection.  Remember - BRUSH YOUR TEETH THE MORNING OF SURGERY WITH YOUR REGULAR TOOTHPASTE  Please do not use if  you have an allergy to CHG or antibacterial soaps. If your skin becomes reddened/irritated stop using the CHG.  Do not shave (including legs and underarms) for at least 48 hours prior to first CHG shower. It is OK to shave your face.  Please follow these instructions carefully.   1. Shower the NIGHT BEFORE SURGERY and the MORNING OF SURGERY with CHG.   2. If you chose to wash your hair, wash your hair first as usual with your normal shampoo.  3. After you shampoo, rinse your hair and body thoroughly to remove the shampoo.  4. Use CHG as you would any other liquid soap. You can apply CHG directly to the skin and wash gently with a scrungie or a clean washcloth.   5. Apply the CHG Soap to your body ONLY FROM THE NECK DOWN.  Do not use on open wounds or open sores. Avoid contact with your eyes, ears, mouth and genitals (private parts). Wash Face and genitals (private parts)  with your normal soap.  6. Wash thoroughly, paying special attention to the area where your surgery will be performed.  7. Thoroughly rinse your body with warm water from the neck down.  8. DO NOT shower/wash with your normal soap after using and rinsing off the CHG Soap.  9. [place]  yourself dry with a CLEAN TOWEL.  10. Wear CLEAN PAJAMAS to bed the night before surgery, wear comfortable clothes the morning of surgery  11. Place CLEAN SHEETS on your bed the night of your first shower and DO NOT SLEEP WITH PETS.    Day of Surgery:  Do not apply any deodorants/lotions.  Please wear clean clothes to the hospital/surgery center.   Remember to brush your teeth WITH YOUR REGULAR TOOTHPASTE.

## 2017-06-14 ENCOUNTER — Encounter (HOSPITAL_COMMUNITY)
Admission: RE | Admit: 2017-06-14 | Discharge: 2017-06-14 | Disposition: A | Discharge status: Home / Self Care | Payer: PPO | Source: Ambulatory Visit | Attending: Orthopedic Surgery | Admitting: Orthopedic Surgery

## 2017-06-14 ENCOUNTER — Encounter (HOSPITAL_COMMUNITY): Payer: Self-pay

## 2017-06-14 ENCOUNTER — Other Ambulatory Visit: Payer: Self-pay

## 2017-06-14 DIAGNOSIS — D649 Anemia, unspecified: Secondary | ICD-10-CM | POA: Diagnosis present

## 2017-06-14 DIAGNOSIS — F329 Major depressive disorder, single episode, unspecified: Secondary | ICD-10-CM | POA: Diagnosis present

## 2017-06-14 DIAGNOSIS — Z885 Allergy status to narcotic agent status: Secondary | ICD-10-CM | POA: Diagnosis not present

## 2017-06-14 DIAGNOSIS — S82871A Displaced pilon fracture of right tibia, initial encounter for closed fracture: Secondary | ICD-10-CM | POA: Diagnosis present

## 2017-06-14 DIAGNOSIS — S92001A Unspecified fracture of right calcaneus, initial encounter for closed fracture: Secondary | ICD-10-CM | POA: Diagnosis not present

## 2017-06-14 DIAGNOSIS — Z681 Body mass index (BMI) 19 or less, adult: Secondary | ICD-10-CM | POA: Diagnosis not present

## 2017-06-14 DIAGNOSIS — Z888 Allergy status to other drugs, medicaments and biological substances status: Secondary | ICD-10-CM | POA: Diagnosis not present

## 2017-06-14 DIAGNOSIS — Z87892 Personal history of anaphylaxis: Secondary | ICD-10-CM | POA: Diagnosis not present

## 2017-06-14 DIAGNOSIS — M898X9 Other specified disorders of bone, unspecified site: Secondary | ICD-10-CM | POA: Diagnosis present

## 2017-06-14 DIAGNOSIS — D509 Iron deficiency anemia, unspecified: Secondary | ICD-10-CM | POA: Diagnosis not present

## 2017-06-14 DIAGNOSIS — Z79899 Other long term (current) drug therapy: Secondary | ICD-10-CM | POA: Diagnosis not present

## 2017-06-14 DIAGNOSIS — E119 Type 2 diabetes mellitus without complications: Secondary | ICD-10-CM | POA: Diagnosis present

## 2017-06-14 DIAGNOSIS — K589 Irritable bowel syndrome without diarrhea: Secondary | ICD-10-CM | POA: Diagnosis present

## 2017-06-14 DIAGNOSIS — G8929 Other chronic pain: Secondary | ICD-10-CM | POA: Diagnosis present

## 2017-06-14 DIAGNOSIS — F17210 Nicotine dependence, cigarettes, uncomplicated: Secondary | ICD-10-CM | POA: Diagnosis present

## 2017-06-14 DIAGNOSIS — M85861 Other specified disorders of bone density and structure, right lower leg: Secondary | ICD-10-CM | POA: Diagnosis present

## 2017-06-14 DIAGNOSIS — Z90710 Acquired absence of both cervix and uterus: Secondary | ICD-10-CM | POA: Diagnosis not present

## 2017-06-14 DIAGNOSIS — S92061A Displaced intraarticular fracture of right calcaneus, initial encounter for closed fracture: Secondary | ICD-10-CM | POA: Diagnosis present

## 2017-06-14 DIAGNOSIS — E559 Vitamin D deficiency, unspecified: Secondary | ICD-10-CM | POA: Diagnosis present

## 2017-06-14 DIAGNOSIS — R64 Cachexia: Secondary | ICD-10-CM | POA: Diagnosis present

## 2017-06-14 DIAGNOSIS — N3010 Interstitial cystitis (chronic) without hematuria: Secondary | ICD-10-CM | POA: Diagnosis present

## 2017-06-14 DIAGNOSIS — K219 Gastro-esophageal reflux disease without esophagitis: Secondary | ICD-10-CM | POA: Diagnosis present

## 2017-06-14 DIAGNOSIS — Z903 Acquired absence of stomach [part of]: Secondary | ICD-10-CM | POA: Diagnosis not present

## 2017-06-14 DIAGNOSIS — Z882 Allergy status to sulfonamides status: Secondary | ICD-10-CM | POA: Diagnosis not present

## 2017-06-14 DIAGNOSIS — S82301A Unspecified fracture of lower end of right tibia, initial encounter for closed fracture: Secondary | ICD-10-CM | POA: Diagnosis present

## 2017-06-14 HISTORY — DX: Interstitial cystitis (chronic) without hematuria: N30.10

## 2017-06-14 LAB — SURGICAL PCR SCREEN
MRSA, PCR: NEGATIVE
Staphylococcus aureus: NEGATIVE

## 2017-06-14 LAB — CBC
HCT: 40.3 % (ref 36.0–46.0)
Hemoglobin: 12.3 g/dL (ref 12.0–15.0)
MCH: 30.1 pg (ref 26.0–34.0)
MCHC: 30.5 g/dL (ref 30.0–36.0)
MCV: 98.5 fL (ref 78.0–100.0)
PLATELETS: 225 10*3/uL (ref 150–400)
RBC: 4.09 MIL/uL (ref 3.87–5.11)
RDW: 21.4 % — AB (ref 11.5–15.5)
WBC: 8.1 10*3/uL (ref 4.0–10.5)

## 2017-06-14 NOTE — Progress Notes (Signed)
PCP - Dr. Cyndi Bender Cardiologist - Patient denies Urologist - Dr. Alona Bene  Chest x-ray - n/a EKG - n/a Stress Test - patient denies ECHO - patient denies Cardiac Cath - patient denies  Sleep Study - patient denies  Blood Thinner Instructions: Patient's last dose of Lovenox 06/16/2017  Anesthesia review:   Patient denies shortness of breath, fever, cough and chest pain at PAT appointment   Patient verbalized understanding of instructions that were given to them at the PAT appointment. Patient was also instructed that they will need to review over the PAT instructions again at home before surgery.

## 2017-06-14 NOTE — Pre-Procedure Instructions (Addendum)
SANDRALEE TARKINGTON  06/14/2017      [id number] [address], [place] [address] Phone: [phone] Fax: [phone]    Your procedure is scheduled on Thursday, June 6th   Report to [facility] Admitting at 6:00 AM             (posted surgery time 8a - 12:30p)   Call this number if you have problems the morning of surgery:  [phone]   Remember:              7 days prior to surgery STOP taking any Aspirin(unless otherwise instructed by your surgeon), Aleve, Naproxen, Ibuprofen, Motrin, Advil, Goody's, BC's, all herbal medications, fish oil, and all vitamins   Nothing to eat or drink after 12 midnight Wednesday.    Take these medicines the morning of surgery with A SIP OF WATER :  Esomeprazole (Nexium) Gabapentin (Neurontin) Oxycodone-acetaminophen (Percocet) - if needed Promethazine (Phenergan) - if needed Tramadol (Ultram)  Last dose of Lovenox - 06/16/2017   Do not wear jewelry, make-up or nail polish.  Do not wear lotions, powders, or perfumes, or deodorant.  Do not shave 48 hours prior to surgery.   Do not bring valuables to the hospital.  [facility] is not responsible for any belongings or valuables.  Contacts, dentures or bridgework may not be worn into surgery.  Leave your suitcase in the car.  After surgery it may be brought to your room.  For patients admitted to the hospital, discharge time will be determined by your treatment team.  Please read over the following fact sheets that you were given.        [place]- Preparing For Surgery  Before surgery, you can play an important role. Because skin is not sterile, your skin needs to be as free of germs as possible. You can reduce the number of germs on your skin by washing with CHG (chlorahexidine gluconate) Soap before surgery.  CHG is an antiseptic cleaner which kills germs and bonds with the skin to continue  killing germs even after washing.    Oral Hygiene is also important to reduce your risk of infection.  Remember - BRUSH YOUR TEETH THE MORNING OF SURGERY WITH YOUR REGULAR TOOTHPASTE  Please do not use if you have an allergy to CHG or antibacterial soaps. If your skin becomes reddened/irritated stop using the CHG.  Do not shave (including legs and underarms) for at least 48 hours prior to first CHG shower. It is OK to shave your face.  Please follow these instructions carefully.   1. Shower the NIGHT BEFORE SURGERY and the MORNING OF SURGERY with CHG.   2. If you chose to wash your hair, wash your hair first as usual with your normal shampoo.  3. After you shampoo, rinse your hair and body thoroughly to remove the shampoo.  4. Use CHG as you would any other liquid soap. You can apply CHG directly to the skin and wash gently with a scrungie or a clean washcloth.   5. Apply the CHG Soap to your body ONLY FROM THE NECK DOWN.  Do not use on open wounds or open sores. Avoid contact with your eyes, ears, mouth and genitals (private parts). Wash Face and genitals (private parts)  with your normal soap.  6. Wash thoroughly, paying special attention to the area where your surgery will be performed.  7. Thoroughly rinse  your body with warm water from the neck down.  8. DO NOT shower/wash with your normal soap after using and rinsing off the CHG Soap.  9. Pat yourself dry with a CLEAN TOWEL.  10. Wear CLEAN PAJAMAS to bed the night before surgery, wear comfortable clothes the morning of surgery  11. Place CLEAN SHEETS on your bed the night of your first shower and DO NOT SLEEP WITH PETS.    Day of Surgery:  Do not apply any deodorants/lotions.  Please wear clean clothes to the hospital/surgery center.   Remember to brush your teeth WITH YOUR REGULAR TOOTHPASTE.
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
## 2017-06-17 ENCOUNTER — Inpatient Hospital Stay (HOSPITAL_COMMUNITY): Payer: PPO

## 2017-06-17 ENCOUNTER — Inpatient Hospital Stay (HOSPITAL_COMMUNITY): Payer: PPO | Admitting: Certified Registered"

## 2017-06-17 ENCOUNTER — Other Ambulatory Visit: Payer: Self-pay

## 2017-06-17 ENCOUNTER — Encounter (HOSPITAL_COMMUNITY): Payer: Self-pay

## 2017-06-17 ENCOUNTER — Inpatient Hospital Stay (HOSPITAL_COMMUNITY)
Admission: RE | Admit: 2017-06-17 | Discharge: 2017-06-19 | DRG: 493 | Disposition: A | Discharge status: Home / Self Care | Payer: PPO | Source: Ambulatory Visit | Attending: Orthopedic Surgery | Admitting: Orthopedic Surgery

## 2017-06-17 ENCOUNTER — Encounter (HOSPITAL_COMMUNITY)
Admission: RE | Disposition: A | Discharge status: Home / Self Care | Payer: Self-pay | Source: Ambulatory Visit | Attending: Orthopedic Surgery

## 2017-06-17 DIAGNOSIS — S82871A Displaced pilon fracture of right tibia, initial encounter for closed fracture: Principal | ICD-10-CM | POA: Diagnosis present

## 2017-06-17 DIAGNOSIS — Z882 Allergy status to sulfonamides status: Secondary | ICD-10-CM

## 2017-06-17 DIAGNOSIS — Z87892 Personal history of anaphylaxis: Secondary | ICD-10-CM

## 2017-06-17 DIAGNOSIS — K219 Gastro-esophageal reflux disease without esophagitis: Secondary | ICD-10-CM | POA: Diagnosis present

## 2017-06-17 DIAGNOSIS — Z419 Encounter for procedure for purposes other than remedying health state, unspecified: Secondary | ICD-10-CM

## 2017-06-17 DIAGNOSIS — S92061A Displaced intraarticular fracture of right calcaneus, initial encounter for closed fracture: Secondary | ICD-10-CM | POA: Diagnosis present

## 2017-06-17 DIAGNOSIS — Z885 Allergy status to narcotic agent status: Secondary | ICD-10-CM | POA: Diagnosis not present

## 2017-06-17 DIAGNOSIS — R64 Cachexia: Secondary | ICD-10-CM | POA: Diagnosis present

## 2017-06-17 DIAGNOSIS — Z90710 Acquired absence of both cervix and uterus: Secondary | ICD-10-CM

## 2017-06-17 DIAGNOSIS — Z79899 Other long term (current) drug therapy: Secondary | ICD-10-CM | POA: Diagnosis not present

## 2017-06-17 DIAGNOSIS — M85861 Other specified disorders of bone density and structure, right lower leg: Secondary | ICD-10-CM | POA: Diagnosis present

## 2017-06-17 DIAGNOSIS — F329 Major depressive disorder, single episode, unspecified: Secondary | ICD-10-CM | POA: Diagnosis present

## 2017-06-17 DIAGNOSIS — S92001A Unspecified fracture of right calcaneus, initial encounter for closed fracture: Secondary | ICD-10-CM | POA: Diagnosis present

## 2017-06-17 DIAGNOSIS — D649 Anemia, unspecified: Secondary | ICD-10-CM | POA: Diagnosis present

## 2017-06-17 DIAGNOSIS — G8929 Other chronic pain: Secondary | ICD-10-CM | POA: Diagnosis present

## 2017-06-17 DIAGNOSIS — K589 Irritable bowel syndrome without diarrhea: Secondary | ICD-10-CM | POA: Diagnosis present

## 2017-06-17 DIAGNOSIS — N3010 Interstitial cystitis (chronic) without hematuria: Secondary | ICD-10-CM | POA: Diagnosis present

## 2017-06-17 DIAGNOSIS — Z681 Body mass index (BMI) 19 or less, adult: Secondary | ICD-10-CM

## 2017-06-17 DIAGNOSIS — F17210 Nicotine dependence, cigarettes, uncomplicated: Secondary | ICD-10-CM | POA: Diagnosis present

## 2017-06-17 DIAGNOSIS — Z903 Acquired absence of stomach [part of]: Secondary | ICD-10-CM | POA: Diagnosis not present

## 2017-06-17 DIAGNOSIS — M898X9 Other specified disorders of bone, unspecified site: Secondary | ICD-10-CM | POA: Diagnosis present

## 2017-06-17 DIAGNOSIS — E119 Type 2 diabetes mellitus without complications: Secondary | ICD-10-CM | POA: Diagnosis present

## 2017-06-17 DIAGNOSIS — E559 Vitamin D deficiency, unspecified: Secondary | ICD-10-CM | POA: Diagnosis present

## 2017-06-17 DIAGNOSIS — Z888 Allergy status to other drugs, medicaments and biological substances status: Secondary | ICD-10-CM

## 2017-06-17 DIAGNOSIS — S82301A Unspecified fracture of lower end of right tibia, initial encounter for closed fracture: Secondary | ICD-10-CM

## 2017-06-17 DIAGNOSIS — F32A Depression, unspecified: Secondary | ICD-10-CM | POA: Diagnosis present

## 2017-06-17 DIAGNOSIS — T148XXA Other injury of unspecified body region, initial encounter: Secondary | ICD-10-CM

## 2017-06-17 HISTORY — PX: ORIF TIBIA FRACTURE: SHX5416

## 2017-06-17 HISTORY — DX: Other chronic pain: G89.29

## 2017-06-17 HISTORY — DX: Unspecified fracture of lower end of right tibia, initial encounter for closed fracture: S82.301A

## 2017-06-17 HISTORY — DX: Unspecified fracture of right calcaneus, initial encounter for closed fracture: S92.001A

## 2017-06-17 LAB — URINALYSIS, ROUTINE W REFLEX MICROSCOPIC
BACTERIA UA: NONE SEEN
BILIRUBIN URINE: NEGATIVE
Glucose, UA: NEGATIVE mg/dL
KETONES UR: NEGATIVE mg/dL
NITRITE: NEGATIVE
Protein, ur: NEGATIVE mg/dL
Specific Gravity, Urine: 1.032 — ABNORMAL HIGH (ref 1.005–1.030)
pH: 5 (ref 5.0–8.0)

## 2017-06-17 LAB — CBC WITH DIFFERENTIAL/PLATELET
BASOS ABS: 0 10*3/uL (ref 0.0–0.1)
Basophils Relative: 0 %
EOS PCT: 4 %
Eosinophils Absolute: 0.4 10*3/uL (ref 0.0–0.7)
HCT: 35.5 % — ABNORMAL LOW (ref 36.0–46.0)
Hemoglobin: 11 g/dL — ABNORMAL LOW (ref 12.0–15.0)
Lymphocytes Relative: 30 %
Lymphs Abs: 2.6 10*3/uL (ref 0.7–4.0)
MCH: 30.5 pg (ref 26.0–34.0)
MCHC: 31 g/dL (ref 30.0–36.0)
MCV: 98.3 fL (ref 78.0–100.0)
MONO ABS: 0.5 10*3/uL (ref 0.1–1.0)
Monocytes Relative: 6 %
Neutro Abs: 5.3 10*3/uL (ref 1.7–7.7)
Neutrophils Relative %: 60 %
PLATELETS: 205 10*3/uL (ref 150–400)
RBC: 3.61 MIL/uL — AB (ref 3.87–5.11)
RDW: 21.1 % — AB (ref 11.5–15.5)
WBC: 8.8 10*3/uL (ref 4.0–10.5)

## 2017-06-17 LAB — COMPREHENSIVE METABOLIC PANEL
ALBUMIN: 3.2 g/dL — AB (ref 3.5–5.0)
ALT: 11 U/L — AB (ref 14–54)
AST: 15 U/L (ref 15–41)
Alkaline Phosphatase: 122 U/L (ref 38–126)
Anion gap: 8 (ref 5–15)
BUN: 13 mg/dL (ref 6–20)
CHLORIDE: 109 mmol/L (ref 101–111)
CO2: 22 mmol/L (ref 22–32)
CREATININE: 0.5 mg/dL (ref 0.44–1.00)
Calcium: 8.8 mg/dL — ABNORMAL LOW (ref 8.9–10.3)
GFR calc Af Amer: >60 mL/min (ref >60)
GFR calc non Af Amer: >60 mL/min (ref >60)
GLUCOSE: 76 mg/dL (ref 65–99)
POTASSIUM: 3.8 mmol/L (ref 3.5–5.1)
Sodium: 139 mmol/L (ref 135–145)
Total Bilirubin: 0.5 mg/dL (ref 0.3–1.2)
Total Protein: 5.5 g/dL — ABNORMAL LOW (ref 6.5–8.1)

## 2017-06-17 LAB — MAGNESIUM: MAGNESIUM: 1.7 mg/dL (ref 1.7–2.4)

## 2017-06-17 LAB — TYPE AND SCREEN
ABO/RH(D): O POS
ANTIBODY SCREEN: NEGATIVE

## 2017-06-17 LAB — APTT: aPTT: 30 seconds (ref 24–36)

## 2017-06-17 LAB — ABO/RH: ABO/RH(D): O POS

## 2017-06-17 LAB — PHOSPHORUS: PHOSPHORUS: 4 mg/dL (ref 2.5–4.6)

## 2017-06-17 LAB — PROTIME-INR
INR: 1.01
Prothrombin Time: 13.2 seconds (ref 11.4–15.2)

## 2017-06-17 LAB — TSH: TSH: 7.1 u[IU]/mL — ABNORMAL HIGH (ref 0.350–4.500)

## 2017-06-17 SURGERY — OPEN REDUCTION INTERNAL FIXATION (ORIF) TIBIA FRACTURE
Anesthesia: General | Laterality: Right

## 2017-06-17 MED ORDER — METOCLOPRAMIDE HCL 5 MG/ML IJ SOLN: 5.0000 mg | Freq: Three times a day (TID) | INTRAMUSCULAR | Status: DC | PRN

## 2017-06-17 MED ORDER — LIDOCAINE 2% (20 MG/ML) 5 ML SYRINGE
INTRAMUSCULAR | Status: AC
  Filled 2017-06-17: qty 5

## 2017-06-17 MED ORDER — ONDANSETRON HCL 4 MG/2ML IJ SOLN: 4.0000 mg | Freq: Four times a day (QID) | INTRAMUSCULAR | Status: DC | PRN

## 2017-06-17 MED ORDER — DEXAMETHASONE SODIUM PHOSPHATE 10 MG/ML IJ SOLN
INTRAMUSCULAR | Status: AC
  Filled 2017-06-17: qty 1

## 2017-06-17 MED ORDER — ACETAMINOPHEN 500 MG PO TABS
1000.0000 mg | ORAL_TABLET | Freq: Once | ORAL | Status: AC
  Administered 2017-06-17: 1000 mg via ORAL
  Filled 2017-06-17: qty 2

## 2017-06-17 MED ORDER — FENTANYL CITRATE (PF) 100 MCG/2ML IJ SOLN
INTRAMUSCULAR | Status: DC | PRN
  Administered 2017-06-17: 100 ug via INTRAVENOUS
  Administered 2017-06-17 (×5): 50 ug via INTRAVENOUS

## 2017-06-17 MED ORDER — ACETAMINOPHEN 500 MG PO TABS
500.0000 mg | ORAL_TABLET | Freq: Three times a day (TID) | ORAL | Status: AC
  Administered 2017-06-17 – 2017-06-18 (×4): 500 mg via ORAL
  Filled 2017-06-17 (×5): qty 1

## 2017-06-17 MED ORDER — METHOCARBAMOL 1000 MG/10ML IJ SOLN
500.0000 mg | Freq: Four times a day (QID) | INTRAVENOUS | Status: DC
  Filled 2017-06-17 (×9): qty 5

## 2017-06-17 MED ORDER — SUCRALFATE 1 GM/10ML PO SUSP
1.0000 g | Freq: Two times a day (BID) | ORAL | Status: DC
  Administered 2017-06-17 – 2017-06-19 (×4): 1 g via ORAL
  Filled 2017-06-17 (×4): qty 10

## 2017-06-17 MED ORDER — PROMETHAZINE HCL 25 MG/ML IJ SOLN: 6.2500 mg | INTRAMUSCULAR | Status: DC | PRN

## 2017-06-17 MED ORDER — ROCURONIUM BROMIDE 10 MG/ML (PF) SYRINGE
PREFILLED_SYRINGE | INTRAVENOUS | Status: AC
  Filled 2017-06-17: qty 5

## 2017-06-17 MED ORDER — LACTATED RINGERS IV SOLN
INTRAVENOUS | Status: DC
  Administered 2017-06-17 (×2): via INTRAVENOUS

## 2017-06-17 MED ORDER — ONDANSETRON HCL 4 MG PO TABS: 4.0000 mg | ORAL_TABLET | Freq: Four times a day (QID) | ORAL | Status: DC | PRN

## 2017-06-17 MED ORDER — OXYCODONE HCL 5 MG/5ML PO SOLN: 5.0000 mg | Freq: Once | ORAL | Status: DC | PRN

## 2017-06-17 MED ORDER — HYDROMORPHONE HCL 2 MG/ML IJ SOLN
INTRAMUSCULAR | Status: AC
  Administered 2017-06-17: 0.5 mg via INTRAVENOUS
  Filled 2017-06-17: qty 1

## 2017-06-17 MED ORDER — DEXMEDETOMIDINE HCL IN NACL 200 MCG/50ML IV SOLN
INTRAVENOUS | Status: DC | PRN
  Administered 2017-06-17 (×2): 4 ug via INTRAVENOUS

## 2017-06-17 MED ORDER — ONDANSETRON HCL 4 MG/2ML IJ SOLN
INTRAMUSCULAR | Status: DC | PRN
  Administered 2017-06-17: 4 mg via INTRAVENOUS

## 2017-06-17 MED ORDER — METHOCARBAMOL 750 MG PO TABS
750.0000 mg | ORAL_TABLET | Freq: Four times a day (QID) | ORAL | Status: DC
  Administered 2017-06-17 – 2017-06-19 (×7): 750 mg via ORAL
  Filled 2017-06-17 (×7): qty 1

## 2017-06-17 MED ORDER — ENOXAPARIN SODIUM 40 MG/0.4ML ~~LOC~~ SOLN: 40.0000 mg | SUBCUTANEOUS | Status: DC

## 2017-06-17 MED ORDER — DEXAMETHASONE SODIUM PHOSPHATE 10 MG/ML IJ SOLN
INTRAMUSCULAR | Status: DC | PRN
  Administered 2017-06-17: 10 mg via INTRAVENOUS

## 2017-06-17 MED ORDER — GABAPENTIN 300 MG PO CAPS
300.0000 mg | ORAL_CAPSULE | Freq: Three times a day (TID) | ORAL | Status: DC
  Administered 2017-06-17 – 2017-06-19 (×6): 300 mg via ORAL
  Filled 2017-06-17 (×5): qty 1

## 2017-06-17 MED ORDER — QUETIAPINE FUMARATE 50 MG PO TABS
50.0000 mg | ORAL_TABLET | Freq: Every day | ORAL | Status: DC
  Administered 2017-06-17 – 2017-06-18 (×2): 50 mg via ORAL
  Filled 2017-06-17 (×2): qty 1

## 2017-06-17 MED ORDER — CEFAZOLIN SODIUM-DEXTROSE 1-4 GM/50ML-% IV SOLN
1.0000 g | Freq: Four times a day (QID) | INTRAVENOUS | Status: AC
  Administered 2017-06-17 – 2017-06-18 (×3): 1 g via INTRAVENOUS
  Filled 2017-06-17 (×3): qty 50

## 2017-06-17 MED ORDER — POTASSIUM CHLORIDE IN NACL 20-0.9 MEQ/L-% IV SOLN
INTRAVENOUS | Status: DC
  Administered 2017-06-17: 18:00:00 via INTRAVENOUS
  Filled 2017-06-17: qty 1000

## 2017-06-17 MED ORDER — TRAZODONE HCL 50 MG PO TABS
50.0000 mg | ORAL_TABLET | Freq: Every day | ORAL | Status: DC
  Administered 2017-06-17 – 2017-06-18 (×2): 50 mg via ORAL
  Filled 2017-06-17 (×2): qty 1

## 2017-06-17 MED ORDER — CEFAZOLIN SODIUM-DEXTROSE 2-4 GM/100ML-% IV SOLN
2.0000 g | INTRAVENOUS | Status: AC
  Administered 2017-06-17: 2 g via INTRAVENOUS
  Filled 2017-06-17: qty 100

## 2017-06-17 MED ORDER — ENSURE ENLIVE PO LIQD
237.0000 mL | Freq: Two times a day (BID) | ORAL | Status: DC
  Administered 2017-06-18 – 2017-06-19 (×3): 237 mL via ORAL

## 2017-06-17 MED ORDER — PROPOFOL 10 MG/ML IV BOLUS
INTRAVENOUS | Status: AC
  Filled 2017-06-17: qty 20

## 2017-06-17 MED ORDER — HYDROMORPHONE HCL 2 MG/ML IJ SOLN
0.2500 mg | INTRAMUSCULAR | Status: DC | PRN
  Administered 2017-06-17 (×3): 0.5 mg via INTRAVENOUS

## 2017-06-17 MED ORDER — LIDOCAINE HCL (CARDIAC) PF 100 MG/5ML IV SOSY
PREFILLED_SYRINGE | INTRAVENOUS | Status: DC | PRN
  Administered 2017-06-17: 40 mg via INTRAVENOUS

## 2017-06-17 MED ORDER — PANTOPRAZOLE SODIUM 40 MG PO TBEC
40.0000 mg | DELAYED_RELEASE_TABLET | Freq: Every day | ORAL | Status: DC
  Administered 2017-06-17 – 2017-06-19 (×3): 40 mg via ORAL
  Filled 2017-06-17 (×2): qty 1

## 2017-06-17 MED ORDER — ENOXAPARIN SODIUM 30 MG/0.3ML ~~LOC~~ SOLN
30.0000 mg | SUBCUTANEOUS | Status: DC
  Administered 2017-06-18 – 2017-06-19 (×2): 30 mg via SUBCUTANEOUS
  Filled 2017-06-17 (×2): qty 0.3

## 2017-06-17 MED ORDER — CHLORHEXIDINE GLUCONATE 4 % EX LIQD: 60.0000 mL | Freq: Once | CUTANEOUS | Status: DC

## 2017-06-17 MED ORDER — 0.9 % SODIUM CHLORIDE (POUR BTL) OPTIME
TOPICAL | Status: DC | PRN
  Administered 2017-06-17: 1000 mL

## 2017-06-17 MED ORDER — POLYETHYLENE GLYCOL 3350 17 G PO PACK
17.0000 g | PACK | Freq: Every day | ORAL | Status: DC
  Administered 2017-06-17 – 2017-06-19 (×3): 17 g via ORAL
  Filled 2017-06-17 (×2): qty 1

## 2017-06-17 MED ORDER — METOCLOPRAMIDE HCL 5 MG PO TABS: 5.0000 mg | ORAL_TABLET | Freq: Three times a day (TID) | ORAL | Status: DC | PRN

## 2017-06-17 MED ORDER — SUGAMMADEX SODIUM 200 MG/2ML IV SOLN
INTRAVENOUS | Status: AC
  Filled 2017-06-17: qty 2

## 2017-06-17 MED ORDER — FENTANYL CITRATE (PF) 250 MCG/5ML IJ SOLN
INTRAMUSCULAR | Status: AC
Start: 2017-06-17 — End: ?
  Filled 2017-06-17: qty 5

## 2017-06-17 MED ORDER — OXYCODONE-ACETAMINOPHEN 5-325 MG PO TABS
1.0000 | ORAL_TABLET | Freq: Four times a day (QID) | ORAL | Status: DC | PRN
  Administered 2017-06-17 – 2017-06-19 (×5): 1 via ORAL
  Filled 2017-06-17 (×5): qty 1

## 2017-06-17 MED ORDER — DOCUSATE SODIUM 100 MG PO CAPS
100.0000 mg | ORAL_CAPSULE | Freq: Two times a day (BID) | ORAL | Status: DC
  Administered 2017-06-17 – 2017-06-19 (×4): 100 mg via ORAL
  Filled 2017-06-17 (×4): qty 1

## 2017-06-17 MED ORDER — ACETAMINOPHEN 325 MG PO TABS
325.0000 mg | ORAL_TABLET | Freq: Four times a day (QID) | ORAL | Status: DC | PRN
  Administered 2017-06-19: 650 mg via ORAL
  Filled 2017-06-17: qty 2

## 2017-06-17 MED ORDER — KETOROLAC TROMETHAMINE 15 MG/ML IJ SOLN
15.0000 mg | Freq: Four times a day (QID) | INTRAMUSCULAR | Status: DC
  Administered 2017-06-17 – 2017-06-19 (×7): 15 mg via INTRAVENOUS
  Filled 2017-06-17 (×7): qty 1

## 2017-06-17 MED ORDER — PROPOFOL 10 MG/ML IV BOLUS
INTRAVENOUS | Status: DC | PRN
  Administered 2017-06-17: 120 mg via INTRAVENOUS

## 2017-06-17 MED ORDER — MEPERIDINE HCL 50 MG/ML IJ SOLN
6.2500 mg | INTRAMUSCULAR | Status: DC | PRN
Start: 2017-06-17 — End: 2017-06-17

## 2017-06-17 MED ORDER — SUGAMMADEX SODIUM 200 MG/2ML IV SOLN
INTRAVENOUS | Status: DC | PRN
  Administered 2017-06-17: 70 mg via INTRAVENOUS

## 2017-06-17 MED ORDER — DEXTROSE 5 % IV SOLN
INTRAVENOUS | Status: DC | PRN
  Administered 2017-06-17: 30 ug/min via INTRAVENOUS

## 2017-06-17 MED ORDER — GABAPENTIN 300 MG PO CAPS
300.0000 mg | ORAL_CAPSULE | Freq: Once | ORAL | Status: DC
  Filled 2017-06-17: qty 1

## 2017-06-17 MED ORDER — OXYCODONE HCL 5 MG PO TABS: 5.0000 mg | ORAL_TABLET | Freq: Once | ORAL | Status: DC | PRN

## 2017-06-17 MED ORDER — ROCURONIUM BROMIDE 100 MG/10ML IV SOLN
INTRAVENOUS | Status: DC | PRN
  Administered 2017-06-17: 50 mg via INTRAVENOUS
  Administered 2017-06-17 (×2): 20 mg via INTRAVENOUS

## 2017-06-17 MED ORDER — HYDROMORPHONE HCL 2 MG/ML IJ SOLN
0.5000 mg | INTRAMUSCULAR | Status: DC | PRN
  Administered 2017-06-17 – 2017-06-18 (×7): 0.5 mg via INTRAVENOUS
  Filled 2017-06-17 (×7): qty 1

## 2017-06-17 MED ORDER — HYDROXYZINE HCL 25 MG PO TABS
25.0000 mg | ORAL_TABLET | Freq: Every evening | ORAL | Status: DC | PRN
Start: 2017-06-17 — End: 2017-06-19
  Administered 2017-06-18: 25 mg via ORAL
  Filled 2017-06-17: qty 1

## 2017-06-17 MED ORDER — FENTANYL CITRATE (PF) 250 MCG/5ML IJ SOLN
INTRAMUSCULAR | Status: AC
  Filled 2017-06-17: qty 5

## 2017-06-17 MED ORDER — MIDAZOLAM HCL 5 MG/5ML IJ SOLN
INTRAMUSCULAR | Status: DC | PRN
  Administered 2017-06-17: 2 mg via INTRAVENOUS

## 2017-06-17 MED ORDER — TRAMADOL HCL 50 MG PO TABS
50.0000 mg | ORAL_TABLET | Freq: Four times a day (QID) | ORAL | Status: DC | PRN
  Administered 2017-06-17 – 2017-06-19 (×3): 50 mg via ORAL
  Filled 2017-06-17 (×3): qty 1

## 2017-06-17 MED ORDER — EPHEDRINE SULFATE 50 MG/ML IJ SOLN
INTRAMUSCULAR | Status: DC | PRN
  Administered 2017-06-17 (×2): 10 mg via INTRAVENOUS

## 2017-06-17 MED ORDER — MIDAZOLAM HCL 2 MG/2ML IJ SOLN
INTRAMUSCULAR | Status: AC
  Filled 2017-06-17: qty 2

## 2017-06-17 MED ORDER — ONDANSETRON HCL 4 MG/2ML IJ SOLN
INTRAMUSCULAR | Status: AC
  Filled 2017-06-17: qty 2

## 2017-06-17 SURGICAL SUPPLY — 95 items
BANDAGE ACE 4X5 VEL STRL LF (GAUZE/BANDAGES/DRESSINGS) ×3 IMPLANT
BANDAGE ACE 6X5 VEL STRL LF (GAUZE/BANDAGES/DRESSINGS) ×3 IMPLANT
BANDAGE ESMARK 6X9 LF (GAUZE/BANDAGES/DRESSINGS) ×1 IMPLANT
BIT DRILL 2.5X2.75 QC CALB (BIT) ×2 IMPLANT
BLADE CLIPPER SURG (BLADE) IMPLANT
BNDG CMPR 9X6 STRL LF SNTH (GAUZE/BANDAGES/DRESSINGS) ×1
BNDG COHESIVE 4X5 TAN STRL (GAUZE/BANDAGES/DRESSINGS) IMPLANT
BNDG ESMARK 6X9 LF (GAUZE/BANDAGES/DRESSINGS) ×3
BNDG GAUZE ELAST 4 BULKY (GAUZE/BANDAGES/DRESSINGS) ×3 IMPLANT
BONE CANC CHIPS 40CC CAN1/2 (Bone Implant) ×3 IMPLANT
BRUSH SCRUB SURG 4.25 DISP (MISCELLANEOUS) ×6 IMPLANT
CHIPS CANC BONE 40CC CAN1/2 (Bone Implant) ×1 IMPLANT
COVER MAYO STAND STRL (DRAPES) ×1 IMPLANT
DRAIN PENROSE 3/4X12 (DRAIN) ×2 IMPLANT
DRAPE C-ARM 42X72 X-RAY (DRAPES) ×3 IMPLANT
DRAPE C-ARMOR (DRAPES) ×3 IMPLANT
DRAPE HALF SHEET 40X57 (DRAPES) ×4 IMPLANT
DRAPE INCISE IOBAN 66X45 STRL (DRAPES) ×3 IMPLANT
DRAPE U-SHAPE 47X51 STRL (DRAPES) ×3 IMPLANT
DRSG ADAPTIC 3X8 NADH LF (GAUZE/BANDAGES/DRESSINGS) ×3 IMPLANT
DRSG PAD ABDOMINAL 8X10 ST (GAUZE/BANDAGES/DRESSINGS) ×12 IMPLANT
ELECT REM PT RETURN 9FT ADLT (ELECTROSURGICAL) ×3
ELECTRODE REM PT RTRN 9FT ADLT (ELECTROSURGICAL) ×1 IMPLANT
GAUZE SPONGE 4X4 12PLY STRL (GAUZE/BANDAGES/DRESSINGS) ×3 IMPLANT
GLOVE BIO SURGEON STRL SZ7.5 (GLOVE) ×5 IMPLANT
GLOVE BIO SURGEON STRL SZ8 (GLOVE) ×5 IMPLANT
GLOVE BIOGEL PI IND STRL 7.5 (GLOVE) ×1 IMPLANT
GLOVE BIOGEL PI IND STRL 8 (GLOVE) ×1 IMPLANT
GLOVE BIOGEL PI INDICATOR 7.5 (GLOVE) ×2
GLOVE BIOGEL PI INDICATOR 8 (GLOVE) ×2
GLOVE PROGUARD SZ 7 1/2 (GLOVE) ×1 IMPLANT
GOWN STRL REUS W/ TWL LRG LVL3 (GOWN DISPOSABLE) ×2 IMPLANT
GOWN STRL REUS W/ TWL XL LVL3 (GOWN DISPOSABLE) ×1 IMPLANT
GOWN STRL REUS W/TWL LRG LVL3 (GOWN DISPOSABLE) ×3
GOWN STRL REUS W/TWL XL LVL3 (GOWN DISPOSABLE) ×3
GRAFT BNE CHIP CANC 1-8 40 (Bone Implant) IMPLANT
K-WIRE ACE 1.6X6 (WIRE) ×24
KIT BASIN OR (CUSTOM PROCEDURE TRAY) ×3 IMPLANT
KIT TURNOVER KIT B (KITS) ×3 IMPLANT
KWIRE ACE 1.6X6 (WIRE) IMPLANT
MANIFOLD NEPTUNE II (INSTRUMENTS) ×1 IMPLANT
NS IRRIG 1000ML POUR BTL (IV SOLUTION) ×3 IMPLANT
PACK ORTHO EXTREMITY (CUSTOM PROCEDURE TRAY) ×3 IMPLANT
PAD ABD 8X10 STRL (GAUZE/BANDAGES/DRESSINGS) ×2 IMPLANT
PAD ARMBOARD 7.5X6 YLW CONV (MISCELLANEOUS) ×8 IMPLANT
PAD CAST 4YDX4 CTTN HI CHSV (CAST SUPPLIES) ×1 IMPLANT
PADDING CAST COTTON 4X4 STRL (CAST SUPPLIES) ×3
PADDING CAST COTTON 6X4 STRL (CAST SUPPLIES) ×3 IMPLANT
PLATE 6H RT DIST ANTLAT TIB (Plate) ×3 IMPLANT
PLATE ACE PERIMETER (Plate) ×2 IMPLANT
PLATE ANTLAT CNTR NAR 114X6 (Plate) IMPLANT
SCREW CORT 3.5X26 (Screw) ×3 IMPLANT
SCREW CORT 3.5X32 (Screw) ×3 IMPLANT
SCREW CORT FT 32X3.5XNONLOCK (Screw) IMPLANT
SCREW CORT T15 26X3.5XST LCK (Screw) IMPLANT
SCREW CORT T15 32X3.5XST LCK (Screw) IMPLANT
SCREW CORTICAL 3.5MM  28MM (Screw) ×4 IMPLANT
SCREW CORTICAL 3.5MM  30MM (Screw) ×4 IMPLANT
SCREW CORTICAL 3.5MM  32MM (Screw) ×2 IMPLANT
SCREW CORTICAL 3.5MM  34MM (Screw) ×2 IMPLANT
SCREW CORTICAL 3.5MM 22MM (Screw) ×2 IMPLANT
SCREW CORTICAL 3.5MM 24MM (Screw) ×2 IMPLANT
SCREW CORTICAL 3.5MM 26MM (Screw) ×2 IMPLANT
SCREW CORTICAL 3.5MM 28MM (Screw) IMPLANT
SCREW CORTICAL 3.5MM 30MM (Screw) IMPLANT
SCREW CORTICAL 3.5MM 32MM (Screw) ×1 IMPLANT
SCREW CORTICAL 3.5MM 34MM (Screw) IMPLANT
SCREW CORTICAL 3.5MM 36MM (Screw) ×2 IMPLANT
SCREW LOCK CORT STAR 3.5X28 (Screw) ×2 IMPLANT
SCREW LOCK CORT STAR 3.5X30 (Screw) ×2 IMPLANT
SCREW LOCK CORT STAR 3.5X34 (Screw) ×2 IMPLANT
SCREW LOCK CORT STAR 3.5X36 (Screw) ×2 IMPLANT
SCREW LP 3.5X38MM (Screw) ×2 IMPLANT
SPLINT PLASTER CAST XFAST 5X30 (CAST SUPPLIES) IMPLANT
SPLINT PLASTER XFAST SET 5X30 (CAST SUPPLIES) ×2
SPONGE LAP 18X18 X RAY DECT (DISPOSABLE) ×2 IMPLANT
STAPLER VISISTAT 35W (STAPLE) ×3 IMPLANT
STOCKINETTE IMPERVIOUS LG (DRAPES) IMPLANT
SUCTION FRAZIER HANDLE 10FR (MISCELLANEOUS) ×2
SUCTION TUBE FRAZIER 10FR DISP (MISCELLANEOUS) ×1 IMPLANT
SUT ETHILON 3 0 PS 1 (SUTURE) ×4 IMPLANT
SUT VIC AB 0 CT1 27 (SUTURE) ×3
SUT VIC AB 0 CT1 27XBRD ANBCTR (SUTURE) ×1 IMPLANT
SUT VIC AB 1 CT1 27 (SUTURE) ×3
SUT VIC AB 1 CT1 27XBRD ANBCTR (SUTURE) ×1 IMPLANT
SUT VIC AB 2-0 CT1 18 (SUTURE) ×2 IMPLANT
SUT VIC AB 2-0 CT1 27 (SUTURE) ×6
SUT VIC AB 2-0 CT1 TAPERPNT 27 (SUTURE) ×2 IMPLANT
TOWEL OR 17X24 6PK STRL BLUE (TOWEL DISPOSABLE) ×3 IMPLANT
TOWEL OR 17X26 10 PK STRL BLUE (TOWEL DISPOSABLE) ×6 IMPLANT
TRAY FOLEY MTR SLVR 16FR STAT (SET/KITS/TRAYS/PACK) IMPLANT
TUBE CONNECTING 12'X1/4 (SUCTIONS) ×1
TUBE CONNECTING 12X1/4 (SUCTIONS) ×2 IMPLANT
WATER STERILE IRR 1000ML POUR (IV SOLUTION) ×2 IMPLANT
YANKAUER SUCT BULB TIP NO VENT (SUCTIONS) ×3 IMPLANT

## 2017-06-17 NOTE — Anesthesia Postprocedure Evaluation (Signed)
Anesthesia Post Note  Patient: LOAN OGUIN  Procedure(s) Performed: OPEN REDUCTION INTERNAL FIXATION (ORIF) DISTAL TIBIA FRACTURE AND CALCANEOUS (Right )     Patient location during evaluation: PACU Anesthesia Type: General Level of consciousness: awake and alert Pain management: pain level controlled Vital Signs Assessment: post-procedure vital signs reviewed and stable Respiratory status: spontaneous breathing, nonlabored ventilation and respiratory function stable Cardiovascular status: blood pressure returned to baseline and stable Postop Assessment: no apparent nausea or vomiting Anesthetic complications: no    Last Vitals:  Vitals:   06/17/17 1310 06/17/17 1325  BP: 102/60 105/70  Pulse: 89 86  Resp: (!) 7 (!) 9  Temp:    SpO2: 100% 100%    Last Pain:  Vitals:   06/17/17 1225  TempSrc:   PainSc: 0-No pain                 Lynda Rainwater

## 2017-06-17 NOTE — Transfer of Care (Signed)
Immediate Anesthesia Transfer of Care Note  Patient: [name]  Procedure(s) Performed: OPEN REDUCTION INTERNAL FIXATION (ORIF) DISTAL TIBIA FRACTURE AND CALCANEOUS (Right )  Patient Location: PACU  Anesthesia Type:General  Level of Consciousness: awake and patient cooperative  Airway & Oxygen Therapy: Patient Spontanous Breathing  Post-op Assessment: Report given to RN and Post -op Vital signs reviewed and stable  Post vital signs: Reviewed and stable  Last Vitals:  Vitals Value Taken Time  BP 97/63 06/17/2017 12:25 PM  Temp    Pulse 97 06/17/2017 12:27 PM  Resp 13 06/17/2017 12:27 PM  SpO2 91 % 06/17/2017 12:27 PM  Vitals shown include unvalidated device data.  Last Pain:  Vitals:   06/17/17 0728  TempSrc:   PainSc: 5       Patients Stated Pain Goal: 3 (63/14/97 0263)  Complications: No apparent anesthesia complications

## 2017-06-17 NOTE — Anesthesia Preprocedure Evaluation (Signed)
Anesthesia Evaluation  Patient identified by MRN, date of birth, ID band Patient awake    Reviewed: Allergy & Precautions, NPO status , Patient's Chart, lab work & pertinent test results  Airway Mallampati: II  TM Distance: >3 FB Neck ROM: Full    Dental no notable dental hx.    Pulmonary neg pulmonary ROS, Current Smoker,    Pulmonary exam normal breath sounds clear to auscultation       Cardiovascular negative cardio ROS Normal cardiovascular exam Rhythm:Regular Rate:Normal     Neuro/Psych  Headaches, Anxiety Depression negative neurological ROS  negative psych ROS   GI/Hepatic negative GI ROS, Neg liver ROS, GERD  ,  Endo/Other  negative endocrine ROS  Renal/GU negative Renal ROS  negative genitourinary   Musculoskeletal negative musculoskeletal ROS (+)   Abdominal   Peds negative pediatric ROS (+)  Hematology negative hematology ROS (+)   Anesthesia Other Findings   Reproductive/Obstetrics negative OB ROS                             Anesthesia Physical Anesthesia Plan  ASA: II  Anesthesia Plan: General   Post-op Pain Management:    Induction: Intravenous  PONV Risk Score and Plan: 2 and Ondansetron and Midazolam  Airway Management Planned: Oral ETT  Additional Equipment:   Intra-op Plan:   Post-operative Plan: Extubation in OR  Informed Consent: I have reviewed the patients History and Physical, chart, labs and discussed the procedure including the risks, benefits and alternatives for the proposed anesthesia with the patient or authorized representative who has indicated his/her understanding and acceptance.   Dental advisory given  Plan Discussed with: CRNA  Anesthesia Plan Comments:         Anesthesia Quick Evaluation

## 2017-06-17 NOTE — Anesthesia Procedure Notes (Signed)
Procedure Name: Intubation Date/Time: 06/17/2017 8:13 AM Performed by: Lance Coon, CRNA Pre-anesthesia Checklist: Patient identified, Emergency Drugs available, Suction available, Patient being monitored and Timeout performed Patient Re-evaluated:Patient Re-evaluated prior to induction Oxygen Delivery Method: Circle system utilized Preoxygenation: Pre-oxygenation with 100% oxygen Induction Type: IV induction Ventilation: Mask ventilation without difficulty Laryngoscope Size: Miller and 2 Grade View: Grade I Tube type: Oral Tube size: 7.0 mm Number of attempts: 1 Airway Equipment and Method: Stylet Placement Confirmation: ETT inserted through vocal cords under direct vision,  positive ETCO2 and breath sounds checked- equal and bilateral Secured at: 20 cm Tube secured with: Tape Dental Injury: Teeth and Oropharynx as per pre-operative assessment

## 2017-06-18 LAB — PTH, INTACT AND CALCIUM
Calcium, Total (PTH): 8.8 mg/dL (ref 8.7–10.2)
PTH: 18 pg/mL (ref 15–65)

## 2017-06-18 LAB — VITAMIN D 25 HYDROXY (VIT D DEFICIENCY, FRACTURES): VIT D 25 HYDROXY: 10.6 ng/mL — AB (ref 30.0–100.0)

## 2017-06-18 LAB — BASIC METABOLIC PANEL
ANION GAP: 3 — AB (ref 5–15)
BUN: 10 mg/dL (ref 6–20)
CALCIUM: 8.6 mg/dL — AB (ref 8.9–10.3)
CO2: 25 mmol/L (ref 22–32)
Chloride: 109 mmol/L (ref 101–111)
Creatinine, Ser: 0.59 mg/dL (ref 0.44–1.00)
Glucose, Bld: 87 mg/dL (ref 65–99)
Potassium: 5.5 mmol/L — ABNORMAL HIGH (ref 3.5–5.1)
SODIUM: 137 mmol/L (ref 135–145)

## 2017-06-18 LAB — CALCITRIOL (1,25 DI-OH VIT D): Vit D, 1,25-Dihydroxy: 34.3 pg/mL (ref 19.9–79.3)

## 2017-06-18 LAB — CALCIUM, IONIZED: Calcium, Ionized, Serum: 5.1 mg/dL (ref 4.5–5.6)

## 2017-06-18 MED ORDER — VITAMIN C 500 MG PO TABS
500.0000 mg | ORAL_TABLET | Freq: Every day | ORAL | Status: DC
  Administered 2017-06-18 – 2017-06-19 (×2): 500 mg via ORAL
  Filled 2017-06-18 (×2): qty 1

## 2017-06-18 MED ORDER — VITAMIN D 1000 UNITS PO TABS
2000.0000 [IU] | ORAL_TABLET | Freq: Two times a day (BID) | ORAL | Status: DC
  Administered 2017-06-18 – 2017-06-19 (×2): 2000 [IU] via ORAL
  Filled 2017-06-18 (×2): qty 2

## 2017-06-18 MED ORDER — SODIUM CHLORIDE 0.9 % IV SOLN
INTRAVENOUS | Status: DC
  Administered 2017-06-18: 12:00:00 via INTRAVENOUS
  Administered 2017-06-19: 75 mL/h via INTRAVENOUS

## 2017-06-18 MED ORDER — FUROSEMIDE 10 MG/ML IJ SOLN
40.0000 mg | Freq: Once | INTRAMUSCULAR | Status: AC
  Administered 2017-06-18: 40 mg via INTRAVENOUS
  Filled 2017-06-18: qty 4
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
## 2017-06-18 NOTE — Progress Notes (Signed)
Orthopedic Tech Progress Note Patient Details:  [name] 1970-06-28 [id number]  Ortho Devices Ortho Device/Splint Location: Trapeze bar Ortho Device/Splint Interventions: Application   Post Interventions Patient Tolerated: Well Instructions Provided: Care of device, Adjustment of device   [place] Pink 06/18/2017, 9:56 AM
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
## 2017-06-19 ENCOUNTER — Encounter (HOSPITAL_COMMUNITY): Payer: Self-pay | Admitting: Orthopedic Surgery

## 2017-06-19 DIAGNOSIS — S92001A Unspecified fracture of right calcaneus, initial encounter for closed fracture: Secondary | ICD-10-CM | POA: Diagnosis present

## 2017-06-19 DIAGNOSIS — G8929 Other chronic pain: Secondary | ICD-10-CM

## 2017-06-19 HISTORY — DX: Unspecified fracture of right calcaneus, initial encounter for closed fracture: S92.001A

## 2017-06-19 HISTORY — DX: Other chronic pain: G89.29

## 2017-06-19 LAB — CBC
HCT: 32.2 % — ABNORMAL LOW (ref 36.0–46.0)
HEMOGLOBIN: 9.8 g/dL — AB (ref 12.0–15.0)
MCH: 30.3 pg (ref 26.0–34.0)
MCHC: 30.4 g/dL (ref 30.0–36.0)
MCV: 99.7 fL (ref 78.0–100.0)
PLATELETS: 188 10*3/uL (ref 150–400)
RBC: 3.23 MIL/uL — ABNORMAL LOW (ref 3.87–5.11)
RDW: 20.4 % — AB (ref 11.5–15.5)
WBC: 5.7 10*3/uL (ref 4.0–10.5)

## 2017-06-19 LAB — BASIC METABOLIC PANEL
ANION GAP: 5 (ref 5–15)
BUN: 10 mg/dL (ref 6–20)
CALCIUM: 8.5 mg/dL — AB (ref 8.9–10.3)
CO2: 27 mmol/L (ref 22–32)
CREATININE: 0.5 mg/dL (ref 0.44–1.00)
Chloride: 110 mmol/L (ref 101–111)
GFR calc Af Amer: >60 mL/min (ref >60)
GLUCOSE: 93 mg/dL (ref 65–99)
Potassium: 3.6 mmol/L (ref 3.5–5.1)
Sodium: 142 mmol/L (ref 135–145)

## 2017-06-19 LAB — T4, FREE: FREE T4: 0.48 ng/dL — AB (ref 0.82–1.77)

## 2017-06-19 MED ORDER — ENSURE ENLIVE PO LIQD: 237.0000 mL | Freq: Two times a day (BID) | ORAL | 12 refills | Status: DC

## 2017-06-19 MED ORDER — DOCUSATE SODIUM 100 MG PO CAPS: 100.0000 mg | ORAL_CAPSULE | Freq: Two times a day (BID) | ORAL | 0 refills | Status: DC

## 2017-06-19 MED ORDER — ACETAMINOPHEN 325 MG PO TABS: 325.0000 mg | ORAL_TABLET | Freq: Four times a day (QID) | ORAL | Status: DC | PRN

## 2017-06-19 MED ORDER — CHOLECALCIFEROL 125 MCG (5000 UT) PO TABS: 5000.0000 [IU] | ORAL_TABLET | Freq: Every day | ORAL | 3 refills | Status: DC

## 2017-06-19 MED ORDER — ENOXAPARIN SODIUM 30 MG/0.3ML ~~LOC~~ SOLN: 30.0000 mg | SUBCUTANEOUS | 0 refills | Status: DC

## 2017-06-19 MED ORDER — METHOCARBAMOL 500 MG PO TABS: 500.0000 mg | ORAL_TABLET | Freq: Three times a day (TID) | ORAL | 0 refills | Status: DC | PRN

## 2017-06-19 MED ORDER — ASCORBIC ACID 500 MG PO TABS: 500.0000 mg | ORAL_TABLET | Freq: Every day | ORAL | 1 refills | Status: DC

## 2017-06-19 MED ORDER — OXYCODONE-ACETAMINOPHEN 5-325 MG PO TABS: 1.0000 | ORAL_TABLET | Freq: Four times a day (QID) | ORAL | 0 refills | Status: DC | PRN

## 2017-06-19 MED ORDER — ENOXAPARIN (LOVENOX) PATIENT EDUCATION KIT
PACK | Freq: Once | Status: DC
Start: 2017-06-19 — End: 2017-06-19
  Filled 2017-06-19: qty 1

## 2017-06-19 MED ORDER — ENOXAPARIN (LOVENOX) PATIENT EDUCATION KIT: 1.0000 | PACK | Freq: Once | 0 refills | Status: AC
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
## 2017-06-19 NOTE — Progress Notes (Signed)
Spoke w [facility], they have lovenox in stock, 30 day supply is $8.50. They have supply through Monday available today. They will restock for  remainder of Rx and it will be available Monday by 12:00.
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
## 2017-06-21 LAB — T3, FREE: T3, Free: 1.8 pg/mL — ABNORMAL LOW (ref 2.0–4.4)
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
## 2017-06-23 DIAGNOSIS — E039 Hypothyroidism, unspecified: Secondary | ICD-10-CM | POA: Diagnosis not present

## 2017-06-23 DIAGNOSIS — K219 Gastro-esophageal reflux disease without esophagitis: Secondary | ICD-10-CM | POA: Diagnosis not present

## 2017-06-23 DIAGNOSIS — D509 Iron deficiency anemia, unspecified: Secondary | ICD-10-CM | POA: Diagnosis not present

## 2017-06-23 DIAGNOSIS — F329 Major depressive disorder, single episode, unspecified: Secondary | ICD-10-CM | POA: Diagnosis not present

## 2017-06-23 DIAGNOSIS — K259 Gastric ulcer, unspecified as acute or chronic, without hemorrhage or perforation: Secondary | ICD-10-CM | POA: Diagnosis not present

## 2017-06-23 DIAGNOSIS — S82301A Unspecified fracture of lower end of right tibia, initial encounter for closed fracture: Secondary | ICD-10-CM | POA: Diagnosis not present

## 2017-06-23 DIAGNOSIS — Z681 Body mass index (BMI) 19 or less, adult: Secondary | ICD-10-CM | POA: Diagnosis not present

## 2017-06-23 DIAGNOSIS — E889 Metabolic disorder, unspecified: Secondary | ICD-10-CM | POA: Diagnosis not present

## 2017-06-30 DIAGNOSIS — S82871D Displaced pilon fracture of right tibia, subsequent encounter for closed fracture with routine healing: Secondary | ICD-10-CM | POA: Diagnosis not present

## 2017-06-30 DIAGNOSIS — S92061D Displaced intraarticular fracture of right calcaneus, subsequent encounter for fracture with routine healing: Secondary | ICD-10-CM | POA: Diagnosis not present

## 2017-07-12 DIAGNOSIS — K219 Gastro-esophageal reflux disease without esophagitis: Secondary | ICD-10-CM | POA: Diagnosis not present

## 2017-07-12 DIAGNOSIS — M791 Myalgia, unspecified site: Secondary | ICD-10-CM | POA: Diagnosis not present

## 2017-07-12 DIAGNOSIS — K589 Irritable bowel syndrome without diarrhea: Secondary | ICD-10-CM | POA: Diagnosis not present

## 2017-07-12 DIAGNOSIS — Z9181 History of falling: Secondary | ICD-10-CM | POA: Diagnosis not present

## 2017-07-12 DIAGNOSIS — S92001D Unspecified fracture of right calcaneus, subsequent encounter for fracture with routine healing: Secondary | ICD-10-CM | POA: Diagnosis not present

## 2017-07-12 DIAGNOSIS — D649 Anemia, unspecified: Secondary | ICD-10-CM | POA: Diagnosis not present

## 2017-07-12 DIAGNOSIS — F329 Major depressive disorder, single episode, unspecified: Secondary | ICD-10-CM | POA: Diagnosis not present

## 2017-07-12 DIAGNOSIS — N3010 Interstitial cystitis (chronic) without hematuria: Secondary | ICD-10-CM | POA: Diagnosis not present

## 2017-07-12 DIAGNOSIS — S82871D Displaced pilon fracture of right tibia, subsequent encounter for closed fracture with routine healing: Secondary | ICD-10-CM | POA: Diagnosis not present

## 2017-07-12 DIAGNOSIS — G43909 Migraine, unspecified, not intractable, without status migrainosus: Secondary | ICD-10-CM | POA: Diagnosis not present

## 2017-07-22 DIAGNOSIS — D649 Anemia, unspecified: Secondary | ICD-10-CM | POA: Diagnosis not present

## 2017-07-22 DIAGNOSIS — M791 Myalgia, unspecified site: Secondary | ICD-10-CM | POA: Diagnosis not present

## 2017-07-22 DIAGNOSIS — S92001D Unspecified fracture of right calcaneus, subsequent encounter for fracture with routine healing: Secondary | ICD-10-CM | POA: Diagnosis not present

## 2017-07-22 DIAGNOSIS — G43909 Migraine, unspecified, not intractable, without status migrainosus: Secondary | ICD-10-CM | POA: Diagnosis not present

## 2017-07-22 DIAGNOSIS — N3010 Interstitial cystitis (chronic) without hematuria: Secondary | ICD-10-CM | POA: Diagnosis not present

## 2017-07-22 DIAGNOSIS — K589 Irritable bowel syndrome without diarrhea: Secondary | ICD-10-CM | POA: Diagnosis not present

## 2017-07-22 DIAGNOSIS — S82871D Displaced pilon fracture of right tibia, subsequent encounter for closed fracture with routine healing: Secondary | ICD-10-CM | POA: Diagnosis not present

## 2017-07-22 DIAGNOSIS — K219 Gastro-esophageal reflux disease without esophagitis: Secondary | ICD-10-CM | POA: Diagnosis not present

## 2017-07-22 DIAGNOSIS — F329 Major depressive disorder, single episode, unspecified: Secondary | ICD-10-CM | POA: Diagnosis not present

## 2017-07-22 DIAGNOSIS — Z9181 History of falling: Secondary | ICD-10-CM | POA: Diagnosis not present

## 2017-08-02 DIAGNOSIS — G43909 Migraine, unspecified, not intractable, without status migrainosus: Secondary | ICD-10-CM | POA: Diagnosis not present

## 2017-08-02 DIAGNOSIS — N3010 Interstitial cystitis (chronic) without hematuria: Secondary | ICD-10-CM | POA: Diagnosis not present

## 2017-08-02 DIAGNOSIS — S92001D Unspecified fracture of right calcaneus, subsequent encounter for fracture with routine healing: Secondary | ICD-10-CM | POA: Diagnosis not present

## 2017-08-02 DIAGNOSIS — K589 Irritable bowel syndrome without diarrhea: Secondary | ICD-10-CM | POA: Diagnosis not present

## 2017-08-02 DIAGNOSIS — Z9181 History of falling: Secondary | ICD-10-CM | POA: Diagnosis not present

## 2017-08-02 DIAGNOSIS — S92061D Displaced intraarticular fracture of right calcaneus, subsequent encounter for fracture with routine healing: Secondary | ICD-10-CM | POA: Diagnosis not present

## 2017-08-02 DIAGNOSIS — M791 Myalgia, unspecified site: Secondary | ICD-10-CM | POA: Diagnosis not present

## 2017-08-02 DIAGNOSIS — F329 Major depressive disorder, single episode, unspecified: Secondary | ICD-10-CM | POA: Diagnosis not present

## 2017-08-02 DIAGNOSIS — D649 Anemia, unspecified: Secondary | ICD-10-CM | POA: Diagnosis not present

## 2017-08-02 DIAGNOSIS — K219 Gastro-esophageal reflux disease without esophagitis: Secondary | ICD-10-CM | POA: Diagnosis not present

## 2017-08-02 DIAGNOSIS — S82871D Displaced pilon fracture of right tibia, subsequent encounter for closed fracture with routine healing: Secondary | ICD-10-CM | POA: Diagnosis not present

## 2017-08-05 DIAGNOSIS — R350 Frequency of micturition: Secondary | ICD-10-CM | POA: Diagnosis not present

## 2017-08-05 DIAGNOSIS — R634 Abnormal weight loss: Secondary | ICD-10-CM | POA: Diagnosis not present

## 2017-08-05 DIAGNOSIS — F1190 Opioid use, unspecified, uncomplicated: Secondary | ICD-10-CM | POA: Diagnosis not present

## 2017-08-09 DIAGNOSIS — G43909 Migraine, unspecified, not intractable, without status migrainosus: Secondary | ICD-10-CM | POA: Diagnosis not present

## 2017-08-09 DIAGNOSIS — S92001D Unspecified fracture of right calcaneus, subsequent encounter for fracture with routine healing: Secondary | ICD-10-CM | POA: Diagnosis not present

## 2017-08-09 DIAGNOSIS — Z9181 History of falling: Secondary | ICD-10-CM | POA: Diagnosis not present

## 2017-08-09 DIAGNOSIS — N3010 Interstitial cystitis (chronic) without hematuria: Secondary | ICD-10-CM | POA: Diagnosis not present

## 2017-08-09 DIAGNOSIS — S82871D Displaced pilon fracture of right tibia, subsequent encounter for closed fracture with routine healing: Secondary | ICD-10-CM | POA: Diagnosis not present

## 2017-08-09 DIAGNOSIS — K589 Irritable bowel syndrome without diarrhea: Secondary | ICD-10-CM | POA: Diagnosis not present

## 2017-08-09 DIAGNOSIS — K219 Gastro-esophageal reflux disease without esophagitis: Secondary | ICD-10-CM | POA: Diagnosis not present

## 2017-08-09 DIAGNOSIS — F329 Major depressive disorder, single episode, unspecified: Secondary | ICD-10-CM | POA: Diagnosis not present

## 2017-08-09 DIAGNOSIS — M791 Myalgia, unspecified site: Secondary | ICD-10-CM | POA: Diagnosis not present

## 2017-08-09 DIAGNOSIS — D649 Anemia, unspecified: Secondary | ICD-10-CM | POA: Diagnosis not present

## 2017-08-11 DIAGNOSIS — D649 Anemia, unspecified: Secondary | ICD-10-CM | POA: Diagnosis not present

## 2017-08-11 DIAGNOSIS — F329 Major depressive disorder, single episode, unspecified: Secondary | ICD-10-CM | POA: Diagnosis not present

## 2017-08-11 DIAGNOSIS — K589 Irritable bowel syndrome without diarrhea: Secondary | ICD-10-CM | POA: Diagnosis not present

## 2017-08-11 DIAGNOSIS — Z9181 History of falling: Secondary | ICD-10-CM | POA: Diagnosis not present

## 2017-08-11 DIAGNOSIS — S82871D Displaced pilon fracture of right tibia, subsequent encounter for closed fracture with routine healing: Secondary | ICD-10-CM | POA: Diagnosis not present

## 2017-08-11 DIAGNOSIS — G43909 Migraine, unspecified, not intractable, without status migrainosus: Secondary | ICD-10-CM | POA: Diagnosis not present

## 2017-08-11 DIAGNOSIS — K219 Gastro-esophageal reflux disease without esophagitis: Secondary | ICD-10-CM | POA: Diagnosis not present

## 2017-08-11 DIAGNOSIS — S92001D Unspecified fracture of right calcaneus, subsequent encounter for fracture with routine healing: Secondary | ICD-10-CM | POA: Diagnosis not present

## 2017-08-11 DIAGNOSIS — N3010 Interstitial cystitis (chronic) without hematuria: Secondary | ICD-10-CM | POA: Diagnosis not present

## 2017-08-11 DIAGNOSIS — M791 Myalgia, unspecified site: Secondary | ICD-10-CM | POA: Diagnosis not present

## 2017-08-16 DIAGNOSIS — G43909 Migraine, unspecified, not intractable, without status migrainosus: Secondary | ICD-10-CM | POA: Diagnosis not present

## 2017-08-16 DIAGNOSIS — F329 Major depressive disorder, single episode, unspecified: Secondary | ICD-10-CM | POA: Diagnosis not present

## 2017-08-16 DIAGNOSIS — S82871D Displaced pilon fracture of right tibia, subsequent encounter for closed fracture with routine healing: Secondary | ICD-10-CM | POA: Diagnosis not present

## 2017-08-16 DIAGNOSIS — K589 Irritable bowel syndrome without diarrhea: Secondary | ICD-10-CM | POA: Diagnosis not present

## 2017-08-16 DIAGNOSIS — S92001D Unspecified fracture of right calcaneus, subsequent encounter for fracture with routine healing: Secondary | ICD-10-CM | POA: Diagnosis not present

## 2017-08-16 DIAGNOSIS — M791 Myalgia, unspecified site: Secondary | ICD-10-CM | POA: Diagnosis not present

## 2017-08-16 DIAGNOSIS — Z9181 History of falling: Secondary | ICD-10-CM | POA: Diagnosis not present

## 2017-08-16 DIAGNOSIS — K219 Gastro-esophageal reflux disease without esophagitis: Secondary | ICD-10-CM | POA: Diagnosis not present

## 2017-08-16 DIAGNOSIS — D649 Anemia, unspecified: Secondary | ICD-10-CM | POA: Diagnosis not present

## 2017-08-16 DIAGNOSIS — N3010 Interstitial cystitis (chronic) without hematuria: Secondary | ICD-10-CM | POA: Diagnosis not present

## 2017-08-23 DIAGNOSIS — Z9181 History of falling: Secondary | ICD-10-CM | POA: Diagnosis not present

## 2017-08-23 DIAGNOSIS — G43909 Migraine, unspecified, not intractable, without status migrainosus: Secondary | ICD-10-CM | POA: Diagnosis not present

## 2017-08-23 DIAGNOSIS — D649 Anemia, unspecified: Secondary | ICD-10-CM | POA: Diagnosis not present

## 2017-08-23 DIAGNOSIS — F329 Major depressive disorder, single episode, unspecified: Secondary | ICD-10-CM | POA: Diagnosis not present

## 2017-08-23 DIAGNOSIS — M791 Myalgia, unspecified site: Secondary | ICD-10-CM | POA: Diagnosis not present

## 2017-08-23 DIAGNOSIS — N3010 Interstitial cystitis (chronic) without hematuria: Secondary | ICD-10-CM | POA: Diagnosis not present

## 2017-08-23 DIAGNOSIS — S82871D Displaced pilon fracture of right tibia, subsequent encounter for closed fracture with routine healing: Secondary | ICD-10-CM | POA: Diagnosis not present

## 2017-08-23 DIAGNOSIS — K219 Gastro-esophageal reflux disease without esophagitis: Secondary | ICD-10-CM | POA: Diagnosis not present

## 2017-08-23 DIAGNOSIS — K589 Irritable bowel syndrome without diarrhea: Secondary | ICD-10-CM | POA: Diagnosis not present

## 2017-08-23 DIAGNOSIS — S92001D Unspecified fracture of right calcaneus, subsequent encounter for fracture with routine healing: Secondary | ICD-10-CM | POA: Diagnosis not present

## 2017-09-01 DIAGNOSIS — S92061D Displaced intraarticular fracture of right calcaneus, subsequent encounter for fracture with routine healing: Secondary | ICD-10-CM | POA: Diagnosis not present

## 2017-09-01 DIAGNOSIS — S82871D Displaced pilon fracture of right tibia, subsequent encounter for closed fracture with routine healing: Secondary | ICD-10-CM | POA: Diagnosis not present

## 2017-09-29 DIAGNOSIS — G5751 Tarsal tunnel syndrome, right lower limb: Secondary | ICD-10-CM | POA: Diagnosis not present

## 2017-09-29 DIAGNOSIS — S82871D Displaced pilon fracture of right tibia, subsequent encounter for closed fracture with routine healing: Secondary | ICD-10-CM | POA: Diagnosis not present

## 2017-09-29 DIAGNOSIS — S92061D Displaced intraarticular fracture of right calcaneus, subsequent encounter for fracture with routine healing: Secondary | ICD-10-CM | POA: Diagnosis not present

## 2017-11-16 DIAGNOSIS — Z681 Body mass index (BMI) 19 or less, adult: Secondary | ICD-10-CM | POA: Diagnosis not present

## 2017-11-16 DIAGNOSIS — Z903 Acquired absence of stomach [part of]: Secondary | ICD-10-CM | POA: Diagnosis not present

## 2017-11-16 DIAGNOSIS — R05 Cough: Secondary | ICD-10-CM | POA: Diagnosis not present

## 2017-11-16 DIAGNOSIS — F329 Major depressive disorder, single episode, unspecified: Secondary | ICD-10-CM | POA: Diagnosis not present

## 2017-11-16 DIAGNOSIS — D509 Iron deficiency anemia, unspecified: Secondary | ICD-10-CM | POA: Diagnosis not present

## 2017-11-16 DIAGNOSIS — E039 Hypothyroidism, unspecified: Secondary | ICD-10-CM | POA: Diagnosis not present

## 2017-11-16 DIAGNOSIS — K219 Gastro-esophageal reflux disease without esophagitis: Secondary | ICD-10-CM | POA: Diagnosis not present

## 2017-11-16 DIAGNOSIS — M545 Low back pain: Secondary | ICD-10-CM | POA: Diagnosis not present

## 2017-11-19 DIAGNOSIS — M25371 Other instability, right ankle: Secondary | ICD-10-CM | POA: Diagnosis not present

## 2018-02-03 DIAGNOSIS — N3010 Interstitial cystitis (chronic) without hematuria: Secondary | ICD-10-CM | POA: Diagnosis not present

## 2018-02-03 DIAGNOSIS — F1190 Opioid use, unspecified, uncomplicated: Secondary | ICD-10-CM | POA: Diagnosis not present

## 2018-02-03 DIAGNOSIS — N3941 Urge incontinence: Secondary | ICD-10-CM | POA: Diagnosis not present

## 2018-03-09 DIAGNOSIS — R300 Dysuria: Secondary | ICD-10-CM | POA: Diagnosis not present

## 2018-03-09 DIAGNOSIS — Z1231 Encounter for screening mammogram for malignant neoplasm of breast: Secondary | ICD-10-CM | POA: Diagnosis not present

## 2018-03-09 DIAGNOSIS — Z681 Body mass index (BMI) 19 or less, adult: Secondary | ICD-10-CM | POA: Diagnosis not present

## 2018-03-23 DIAGNOSIS — N6332 Unspecified lump in axillary tail of the left breast: Secondary | ICD-10-CM | POA: Diagnosis not present

## 2018-03-23 DIAGNOSIS — Z1231 Encounter for screening mammogram for malignant neoplasm of breast: Secondary | ICD-10-CM | POA: Diagnosis not present

## 2018-03-23 DIAGNOSIS — N6311 Unspecified lump in the right breast, upper outer quadrant: Secondary | ICD-10-CM | POA: Diagnosis not present

## 2018-03-23 DIAGNOSIS — Z803 Family history of malignant neoplasm of breast: Secondary | ICD-10-CM | POA: Diagnosis not present

## 2018-06-29 DIAGNOSIS — Z903 Acquired absence of stomach [part of]: Secondary | ICD-10-CM | POA: Diagnosis not present

## 2018-06-29 DIAGNOSIS — K219 Gastro-esophageal reflux disease without esophagitis: Secondary | ICD-10-CM | POA: Diagnosis not present

## 2018-06-29 DIAGNOSIS — Z1331 Encounter for screening for depression: Secondary | ICD-10-CM | POA: Diagnosis not present

## 2018-06-29 DIAGNOSIS — R112 Nausea with vomiting, unspecified: Secondary | ICD-10-CM | POA: Diagnosis not present

## 2018-06-29 DIAGNOSIS — Z681 Body mass index (BMI) 19 or less, adult: Secondary | ICD-10-CM | POA: Diagnosis not present

## 2018-06-29 DIAGNOSIS — F329 Major depressive disorder, single episode, unspecified: Secondary | ICD-10-CM | POA: Diagnosis not present

## 2018-06-29 DIAGNOSIS — Z87891 Personal history of nicotine dependence: Secondary | ICD-10-CM | POA: Diagnosis not present

## 2018-06-29 DIAGNOSIS — D509 Iron deficiency anemia, unspecified: Secondary | ICD-10-CM | POA: Diagnosis not present

## 2018-07-01 ENCOUNTER — Telehealth: Payer: Self-pay | Admitting: Gastroenterology

## 2018-07-01 NOTE — Telephone Encounter (Signed)
Absolutely, fine by me RG

## 2018-07-01 NOTE — Telephone Encounter (Signed)
Dr. Lyndel Safe, pt stated that she was your pt from [facility], but she requested to transfer care to Dr. Hilarie Fredrickson.  She has reported N/V with weight loss and stated that she is now down to 82 lbs.  Will you approve the switch?

## 2018-07-01 NOTE — Telephone Encounter (Signed)
Dr. Hilarie Fredrickson, please see note below.  Will you accept this pt?

## 2018-07-04 NOTE — Telephone Encounter (Signed)
[name] Can you please help obtain her GI records (sounds like she has hx with Dr. Lyndel Safe when he was practicing in [place]) I would like to review and then we can discuss appt

## 2018-07-04 NOTE — Telephone Encounter (Signed)
Records placed on your desk for review. Also see careeverywhere for additional information.

## 2018-07-06 NOTE — Telephone Encounter (Signed)
[place] for an appt, me or APP

## 2018-07-25 ENCOUNTER — Telehealth: Payer: Self-pay | Admitting: General Surgery

## 2018-07-25 NOTE — Telephone Encounter (Signed)
Contacted the patient and she stated that her mother passed away on 2022-05-03 and she is unable to make tomorrows appointment due to her funeral. Would like to reschedule for sometime this week if possible.

## 2018-07-25 NOTE — Telephone Encounter (Signed)
Sorry to hear. I dont think she was on Pyrtle's schedule. Looks like she was scheduled to see Janett Billow. She may have to call back to be rescheduled with Janett Billow when her schedule opens up more, or if she would like, Dr Hilarie Fredrickson currently has an opening for 09/06/18. Let me know if I can help facilitate.

## 2018-07-26 ENCOUNTER — Ambulatory Visit: Payer: PPO | Admitting: Gastroenterology

## 2018-07-27 NOTE — Telephone Encounter (Signed)
The patient contacted the office and rescheduled with Nevin Bloodgood on 07/29/2018

## 2018-07-28 ENCOUNTER — Telehealth: Payer: Self-pay | Admitting: Gastroenterology

## 2018-07-28 NOTE — Telephone Encounter (Signed)
Covid-19 screening questions   Do you now or have you had a fever in the last 14 days? No  Do you have any respiratory symptoms of shortness of breath or cough now or in the last 14 days? No  Do you have any family members or close contacts with diagnosed or suspected Covid-19 in the past 14 days? No  Have you been tested for Covid-19 and found to be positive? No        

## 2018-07-29 ENCOUNTER — Other Ambulatory Visit: Payer: Self-pay

## 2018-07-29 ENCOUNTER — Ambulatory Visit: Payer: PPO | Admitting: Nurse Practitioner

## 2018-07-29 ENCOUNTER — Encounter: Payer: Self-pay | Admitting: Nurse Practitioner

## 2018-07-29 VITALS — BP 96/60 | HR 60 | Ht 59.0 in | Wt 81.0 lb

## 2018-07-29 DIAGNOSIS — K5909 Other constipation: Secondary | ICD-10-CM

## 2018-07-29 DIAGNOSIS — R112 Nausea with vomiting, unspecified: Secondary | ICD-10-CM | POA: Diagnosis not present

## 2018-07-29 MED ORDER — LINACLOTIDE 72 MCG PO CAPS: 72.0000 ug | ORAL_CAPSULE | Freq: Every day | ORAL | 3 refills | Status: DC

## 2018-07-29 NOTE — Progress Notes (Addendum)
ASSESSMENT / PLAN:   48 year old female with hypothyroidism, depression, interstitial cystitis, migraine headaches, history of PUD / GOO, s/p partial gastrectomy June 2018.   1. Chronic nausea / vomiting with malnutrition -  Weight stable at 81 pounds. Unfortunately she continues to take [employer] powders on a daily basis.  Consider recurrent PUD and /  or low-grade, recurrent GOO. -Discontinue Goody powders -Obtain upper GI series -If above negative, consider EGD -Zofran does not help, Phenergan of limited benefit.  -She does not want take nutritional supplement such as boost, Ensure or breeze.  Took these for years and no longer palatable for her -Encouraged small frequent meals -Continue daily PPI  2. Chronic GERD.  No heartburn but she has frequent regurgitation which is likely contributing to her poor dentition  -Continue daily Nexium.  Patient tends to eat at night since this is the only time she is able to tolerate p.o.  Encouraged her to not lay down for 2 to 3 hours after eating  3.  IBS, predominantly constipation but occasionally has unprovoked diarrhea. Sometimes goes 2 weeks without a bowel movement.  She takes Colace and Dulcolax as needed.  Tried MiraLAX in the past, found it not beneficial -Trial of Linzess 72 mcg daily on empty stomach  4. Colon cancer screening. Colonoscopy findings in [facility] July 2014- small internal hemorrhois, otherwise normal to TI. Random biopsies negative. Quality of prep not stated  Addendum: Reviewed and agree with assessment and management plan. UGI series was performed and rather unremarkable.  See below. Nausea and vomiting are largest complaint. I think GES is reasonable and could be done before she follows up with me in person in August. We will discuss EGD at that time. Of note, the patient's mother, a patient of mine (who died recently) had severe malnutrition due to a multitude of medical problems including  Crohn's enteritis, pancreatic insufficiency. Jerene Bears, MD     HPI:    Chief Complaint:   Nausea, vomiting, weight loss   Patient is a 48 year old female with PMH of hypothyroidism, depression, interstitial cystitis, migraine headaches, PUD / GOO, s/p partial gastrectomy June 2018.  Patient accompanied by her father. She was previously followed by Dr. Lyndel Safe in [place], now wants to transfer care to Dr. Hilarie Fredrickson.  Her mother, Blanchard Kelch was followed by Dr. Hilarie Fredrickson, she unfortunately passed away on 2022-05-01. Patient referred by Cyndi Bender, PA for evaluation of nausea, vomiting and weight loss.  She has a very complicated medical history consisting of hypothyroidism, migraine headaches, interstitial cystitis, gastric ulcer/.  For which she is status post partial gastrectomy with gastroduodenostomy in 2018.  Candie has struggled with nausea and vomiting since partial gastrectomy June 2018.  She doesn't eat most days until around 11pm because this allows her to lay down afterwards to avoid nausea / vomiting.  She does occasionally have a good day without nausea / vomiting but these days are rare.  Zofran does not help at all, Phenergan has limited value for her.  She complains of chronic acid regurgitation, thinks it has led to poor dentition over last three years.  No heartburn.  She takes Nexium on a daily basis and carafate bid.  No dysphagia.  Ameenah has IBS.  For the most part she stays constipated sometimes not having a bowel movement for 2 weeks.  Occasionally she has unprovoked diarrhea.  In the past MiraLAX has  not helped, she has tried much else other than the Dulcolax and Colace she takes on an as-needed basis.  Data Reviewed:  06/29/18 Labs - PCP WBC 9.1, hemoglobin 13.5, MCV 96, platelets 200, ferritin 63, TIBC 289, iron saturation 16%   05/12/2016  U/S -for evaluation of abdominal pain showed a 1.9 x 1.3 x 1.2 abnormality adjacent or arising from the pancreatic head not clear if  this is lymph node or mass  05/29/16 CTAP with and without contrast - for follow-up on pancreatic / peripancreatic lesion on ultrasound.  Findings - focal stenosis adjacent to the falciform ligament,  no suspicious liver lesions, normal gallbladder.  CBD normal to minimally dilated for age at 7 mm, gradually tapers in the region of the pancreatic head without obstructive stone or mass.  No pancreatic duct dilation or mass.  Normal spleen.   Past Medical History:  Diagnosis Date  . Allergic rhinitis   . Anemia   . Anxiety disorder   . Calcaneus fracture, right 06/19/2017  . Chronic cystitis   . Chronic interstitial cystitis   . Chronic pain 06/19/2017  . Depression   . GERD (gastroesophageal reflux disease)   . Hemorrhoids   . IBS (irritable bowel syndrome)   . Migraine   . Myalgia   . Vitamin D deficiency     Past Surgical History:  Procedure Laterality Date  . ABDOMINAL HYSTERECTOMY    . EXTERNAL EAR SURGERY     dont know specific ear sugery (inner/external)  . FRACTURE SURGERY     right arm  . mutliple surgeries due to intertitial cystitis    . ORIF TIBIA FRACTURE Right 06/17/2017  . ORIF TIBIA FRACTURE Right 06/17/2017   Procedure: OPEN REDUCTION INTERNAL FIXATION (ORIF) DISTAL TIBIA FRACTURE AND CALCANEOUS;  Surgeon: Altamese Healdton, MD;  Location: [facility];  Service: Orthopedics;  Laterality: Right;  . OTHER SURGICAL HISTORY     TENS implantation for Chronic Interstitial Cystitis  . STOMACH SURGERY  2018   part of stomach removed due to ulcer"   No family history on file. Social History   Tobacco Use  . Smoking status: Current Every Day Smoker    Packs/day: 0.50    Types: Cigarettes  . Smokeless tobacco: Never Used  Substance Use Topics  . Alcohol use: Not Currently  . Drug use: Never   Current Outpatient Medications  Medication Sig Dispense Refill  . acetaminophen (TYLENOL) 325 MG tablet Take 1-2 tablets (325-650 mg total) by mouth every 6 (six) hours as needed for  mild pain (pain score 1-3 or temp > 100.5).    . Aspirin-Salicylamide-Caffeine (BC HEADACHE PO) Take 1 packet by mouth daily as needed.     Marland Kitchen CARAFATE 1 GM/10ML suspension Take 10 mLs by mouth 2 (two) times daily.  5  . docusate sodium (COLACE) 100 MG capsule Take 1 capsule (100 mg total) by mouth 2 (two) times daily. 10 capsule 0  . esomeprazole (NEXIUM) 40 MG capsule Take 40 mg by mouth daily as needed for heartburn.  6  . feeding supplement, ENSURE ENLIVE, (ENSURE ENLIVE) LIQD Take 237 mLs by mouth 2 (two) times daily between meals. 237 mL 12  . gabapentin (NEURONTIN) 300 MG capsule Take 300 mg by mouth 3 (three) times daily.     . hydrOXYzine (ATARAX/VISTARIL) 25 MG tablet Take 25 mg by mouth at bedtime as needed for sleep.    . methocarbamol (ROBAXIN) 500 MG tablet Take 1-2 tablets (500-1,000 mg total) by mouth every 8 (  eight) hours as needed for muscle spasms. 70 tablet 0  . oxyCODONE-acetaminophen (PERCOCET/ROXICET) 5-325 MG tablet Take 1-2 tablets by mouth every 6 (six) hours as needed for severe pain. 60 tablet 0  . promethazine (PHENERGAN) 25 MG tablet Take 25 mg by mouth 3 (three) times daily as needed for nausea/vomiting.  1  . QUEtiapine (SEROQUEL) 50 MG tablet Take 50 mg by mouth at bedtime.  6  . traMADol (ULTRAM) 50 MG tablet 1-2 po q6 hr prn pain    . traZODone (DESYREL) 50 MG tablet Take 50 mg by mouth at bedtime.    . vitamin C (VITAMIN C) 500 MG tablet Take 1 tablet (500 mg total) by mouth daily. 30 tablet 1   No current facility-administered medications for this visit.    Allergies  Allergen Reactions  . Sulfamethoxazole Anaphylaxis  . Morphine And Related Other (See Comments)    Delirium. Makes me go "crazy"  . Xanax Xr [Alprazolam Er] Other (See Comments)    Enhanced depression; feels gloomy     Review of Systems: Positive for anxiety, back pain, depression, fatigue, and urinary frequency. All other systems reviewed and negative except where noted in HPI.    Creatinine clearance cannot be calculated (Patient's most recent lab result is older than the maximum 21 days allowed.)   Physical Exam:    Wt Readings from Last 3 Encounters:  07/29/18 81 lb (36.7 kg)  06/17/17 76 lb (34.5 kg)  06/14/17 76 lb 11.2 oz (34.8 kg)    Ht 4\' 11"  (1.499 m)   Wt 81 lb (36.7 kg)   BMI 16.36 kg/m  Constitutional:  Pleasant thin female in no acute distress. Psychiatric: Normal mood and affect. Behavior is normal. EENT: Pupils normal.  Conjunctivae are normal. No scleral icterus. Neck supple.  Cardiovascular: Normal rate, regular rhythm. No edema Pulmonary/chest: Effort normal and breath sounds normal. No wheezing, rales or rhonchi. Abdominal: Soft, nondistended, nontender. Bowel sounds active throughout. There are no masses palpable. No hepatomegaly. Neurological: Alert and oriented to person place and time. Skin: Skin is warm and dry. No rashes noted.  Tye Savoy, NP  07/29/2018, 8:29 AM  Cc: Cyndi Bender, PA-C

## 2018-07-29 NOTE — Patient Instructions (Addendum)
Stop Goody powder.   We have sent the following medications to your pharmacy for you to pick up at your convenience: Linzess 34mcg.   You have been scheduled for an Upper GI Series at [facility]  Your appointment is on 08/02/2018 at 9:30am. Please arrive 15 minutes prior to your test for registration. Make sure not to eat or drink anything after midnight on the night before your test. If you need to reschedule, please call radiology at [phone]. ________________________________________________________________ An upper GI series uses x rays to help diagnose problems of the upper GI tract, which includes the esophagus, stomach, and duodenum. The duodenum is the first part of the small intestine. An upper GI series is conducted by a radiology technologist or a radiologist-a doctor who specializes in x-ray imaging-at a hospital or outpatient center. While sitting or standing in front of an x-ray machine, the patient drinks barium liquid, which is often white and has a chalky consistency and taste. The barium liquid coats the lining of the upper GI tract and makes signs of disease show up more clearly on x rays. X-ray video, called fluoroscopy, is used to view the barium liquid moving through the esophagus, stomach, and duodenum. Additional x rays and fluoroscopy are performed while the patient lies on an x-ray table. To fully coat the upper GI tract with barium liquid, the technologist or radiologist may press on the abdomen or ask the patient to change position. Patients hold still in various positions, allowing the technologist or radiologist to take x rays of the upper GI tract at different angles. If a technologist conducts the upper GI series, a radiologist will later examine the images to look for problems.  This test typically takes about 1 hour to complete. __________________________________________________________________       [name] have been scheduled a follow-up appointment  with Dr.Pyrtle in August 2020.   Thank you for choosing me and [facility].  [place]

## 2018-08-02 ENCOUNTER — Other Ambulatory Visit: Payer: Self-pay

## 2018-08-02 ENCOUNTER — Ambulatory Visit (HOSPITAL_COMMUNITY)
Admission: RE | Admit: 2018-08-02 | Discharge: 2018-08-02 | Disposition: A | Discharge status: Home / Self Care | Payer: PPO | Source: Ambulatory Visit | Attending: Nurse Practitioner | Admitting: Nurse Practitioner

## 2018-08-02 DIAGNOSIS — R112 Nausea with vomiting, unspecified: Secondary | ICD-10-CM | POA: Diagnosis not present

## 2018-08-02 DIAGNOSIS — K5909 Other constipation: Secondary | ICD-10-CM | POA: Diagnosis not present

## 2018-08-02 DIAGNOSIS — K449 Diaphragmatic hernia without obstruction or gangrene: Secondary | ICD-10-CM | POA: Diagnosis not present

## 2018-08-08 ENCOUNTER — Telehealth: Payer: Self-pay | Admitting: *Deleted

## 2018-08-08 ENCOUNTER — Telehealth: Payer: Self-pay | Admitting: Nurse Practitioner

## 2018-08-08 NOTE — Telephone Encounter (Signed)
Left message for patient to call back  

## 2018-08-08 NOTE — Telephone Encounter (Addendum)
-----   Message from Jerene Bears, MD sent at 08/06/2018  3:24 PM EDT ----- Would pursue GES if she is willing She will see me in later Aug JMP ----- Message ----- From: Willia Craze, NP Sent: 07/29/2018  11:15 AM EDT To: Jerene Bears, MD  Addendum: Reviewed and agree with assessment and management plan. UGI series was performed and rather unremarkable.  See below. Nausea and vomiting are largest complaint. I think GES is reasonable and could be done before she follows up with me in person in August. We will discuss EGD at that time. Of note, the patient's mother, a patient of mine (who died recently) had severe malnutrition due to a multitude of medical problems including Crohn's enteritis, pancreatic insufficiency. Pyrtle, Lajuan Lines, MD

## 2018-08-08 NOTE — Telephone Encounter (Signed)
Duplicated message.

## 2018-08-09 DIAGNOSIS — F1190 Opioid use, unspecified, uncomplicated: Secondary | ICD-10-CM | POA: Diagnosis not present

## 2018-08-09 DIAGNOSIS — F419 Anxiety disorder, unspecified: Secondary | ICD-10-CM | POA: Diagnosis not present

## 2018-08-09 DIAGNOSIS — N3010 Interstitial cystitis (chronic) without hematuria: Secondary | ICD-10-CM | POA: Diagnosis not present

## 2018-08-09 DIAGNOSIS — K580 Irritable bowel syndrome with diarrhea: Secondary | ICD-10-CM | POA: Diagnosis not present

## 2018-08-09 NOTE — Telephone Encounter (Signed)
Spoke with the patient. Advised of the recommendations. She will consider the test.She is to call me back with her decision.

## 2018-08-31 ENCOUNTER — Encounter: Payer: Self-pay | Admitting: *Deleted

## 2018-09-06 ENCOUNTER — Ambulatory Visit (INDEPENDENT_AMBULATORY_CARE_PROVIDER_SITE_OTHER): Payer: PPO | Admitting: Internal Medicine

## 2018-09-06 ENCOUNTER — Encounter: Payer: Self-pay | Admitting: Internal Medicine

## 2018-09-06 ENCOUNTER — Other Ambulatory Visit (INDEPENDENT_AMBULATORY_CARE_PROVIDER_SITE_OTHER): Payer: PPO

## 2018-09-06 VITALS — BP 98/72 | HR 72 | Temp 98.3°F | Ht 59.0 in | Wt 81.4 lb

## 2018-09-06 DIAGNOSIS — R112 Nausea with vomiting, unspecified: Secondary | ICD-10-CM

## 2018-09-06 DIAGNOSIS — K5909 Other constipation: Secondary | ICD-10-CM | POA: Diagnosis not present

## 2018-09-06 DIAGNOSIS — Z903 Acquired absence of stomach [part of]: Secondary | ICD-10-CM

## 2018-09-06 DIAGNOSIS — D649 Anemia, unspecified: Secondary | ICD-10-CM

## 2018-09-06 DIAGNOSIS — F419 Anxiety disorder, unspecified: Secondary | ICD-10-CM

## 2018-09-06 DIAGNOSIS — F32A Depression, unspecified: Secondary | ICD-10-CM

## 2018-09-06 DIAGNOSIS — K219 Gastro-esophageal reflux disease without esophagitis: Secondary | ICD-10-CM

## 2018-09-06 DIAGNOSIS — Z9889 Other specified postprocedural states: Secondary | ICD-10-CM | POA: Diagnosis not present

## 2018-09-06 DIAGNOSIS — R1310 Dysphagia, unspecified: Secondary | ICD-10-CM | POA: Diagnosis not present

## 2018-09-06 DIAGNOSIS — F329 Major depressive disorder, single episode, unspecified: Secondary | ICD-10-CM

## 2018-09-06 DIAGNOSIS — E039 Hypothyroidism, unspecified: Secondary | ICD-10-CM | POA: Diagnosis not present

## 2018-09-06 DIAGNOSIS — Z8711 Personal history of peptic ulcer disease: Secondary | ICD-10-CM | POA: Diagnosis not present

## 2018-09-06 DIAGNOSIS — R1319 Other dysphagia: Secondary | ICD-10-CM

## 2018-09-06 LAB — CBC WITH DIFFERENTIAL/PLATELET
Basophils Absolute: 0 10*3/uL (ref 0.0–0.1)
Basophils Relative: 0.4 % (ref 0.0–3.0)
Eosinophils Absolute: 0.1 10*3/uL (ref 0.0–0.7)
Eosinophils Relative: 2 % (ref 0.0–5.0)
HCT: 40.1 % (ref 36.0–46.0)
Hemoglobin: 13.1 g/dL (ref 12.0–15.0)
Lymphocytes Relative: 38.6 % (ref 12.0–46.0)
Lymphs Abs: 2.9 10*3/uL (ref 0.7–4.0)
MCHC: 32.8 g/dL (ref 30.0–36.0)
MCV: 97.8 fl (ref 78.0–100.0)
Monocytes Absolute: 0.5 10*3/uL (ref 0.1–1.0)
Monocytes Relative: 6.4 % (ref 3.0–12.0)
Neutro Abs: 3.9 10*3/uL (ref 1.4–7.7)
Neutrophils Relative %: 52.6 % (ref 43.0–77.0)
Platelets: 195 10*3/uL (ref 150.0–400.0)
RBC: 4.1 Mil/uL (ref 3.87–5.11)
RDW: 13.9 % (ref 11.5–15.5)
WBC: 7.5 10*3/uL (ref 4.0–10.5)

## 2018-09-06 MED ORDER — LUBIPROSTONE 8 MCG PO CAPS: 8.0000 ug | ORAL_CAPSULE | Freq: Two times a day (BID) | ORAL | 2 refills | Status: DC

## 2018-09-06 NOTE — Progress Notes (Signed)
Subjective:    Patient ID: [name], female    DOB: [birth year], 48 y.o.   MRN: [id number]  HPI [name] is a 48 year old female with a past medical history of GERD, peptic ulcer disease with GOO status post partial gastrectomy with bilateral vagotomy in June 2018, chronic constipation, NSAID use/abuse, interstitial cystitis, hypothyroidism, anxiety and depression, migraine headaches who is seen for follow-up.  She is seen today with her father.  She was seen by Tye Savoy, NP on 07/29/2018.  She reports that she continues to struggle with frequent regurgitation, intermittent trouble swallowing, trouble eating with nausea, vomiting and early satiety along with chronic constipation.  She reports that she has an appetite and that her appetite is not the problem but she is only able to eat late in the evening just before lying down to go to bed.  Her only meal of the day is usually between 11 and 12 midnight at which point she will go to bed shortly thereafter.  She reports she likes to eat pasta and other foods like hot pockets and calzone.  Occasionally solid food will get stuck with swallowing and she feels a constant gurgling and regurgitation into the back of her throat.  There is an acidic bitter taste.  She does not have heartburn or substernal chest burning.  She is tried Nexium without benefit and she recently tried some of her mother's pantoprazole.  Her nausea comes and goes but is definitely present when she eats and has had no help with Zofran ODT or promethazine.  Carafate was unhelpful.  Regarding her bowel movements she can go 1 to 2 weeks without a bowel movement.  After last visit she tried Linzess 72 mcg but this caused solid stool followed by uncontrollable loose stools and diarrhea.  She is still using BC powder but has cut down considerably.  She is now using it 1-2 times per day.  She brings in a symptom journal which states the following:  "Anytime I try to eat, GERD  occurs often very bad enough for people to hear it.  Can be from 1 or 2 bites of anything.  The above can happen even if I am only trying to drink something.  Severe constipation for up to 2 weeks sometimes a little bit more.  Suppositories and enema neither work until up to about the 2-week mark and then often have to try them anyway.  At the beginning of a bowel movement I have to manually start the process because my feces are like clay.  Then usually a few hours up to a day I will have a blowout of massive amounts of diarrhea.  I will sometimes even start vomiting during this.  Strangely I can eat and keep down food the more constipated I am.  I will usually always vomit coffee, chocolate or anything with dairy.  The only time I can eat and usually not throw up is late at night usually 11:49 AM and that is because I lie down right after and do not get back up.  Anytime I eat during the day if I am not able to sit or recline or lie down then I will throw up.  He will not go without food during the day I will often have acid and have to dry heave to get any sort of relief.  Sometimes I feel like food or pills get stuck in my throat and I will get sick.  My teeth are breaking  off and there are problems with almost every tooth.  Have no quality of life pretty much.  Have pain in the right side of my abdomen.  Most of my waking time I have GERD and it can get so bad and allow that it is embarrassing.  Only way to get rid of it or get it to calm down is to lay down.  On occasions at night I will wake up choking on food or acid that I ate before he went to bed.  I have an extremely healthy appetite no problems there."    Review of Systems As per HPI, otherwise negative  Current Medications, Allergies, Past Medical History, Past Surgical History, Family History and Social History were reviewed in [employer] record.      Objective:   Physical Exam BP 98/72 (BP Location: Left Arm,  Patient Position: Sitting, Cuff Size: Normal)   Pulse 72   Temp 98.3 F (36.8 C) (Other (Comment))   Ht 4\' 11"  (1.499 m)   Wt 81 lb 6 oz (36.9 kg)   BMI 16.44 kg/m  Gen: awake, alert, NAD HEENT: anicteric, op clear CV: RRR, no mrg Pulm: CTA b/l Abd: soft, thin scaphoid abdomen with well-healed vertical midline incision, NT/ND, +BS throughout Ext: no c/c/e Neuro: nonfocal  CBC    Component Value Date/Time   WBC 5.7 06/19/2017 0417   RBC 3.23 (L) 06/19/2017 0417   HGB 9.8 (L) 06/19/2017 0417   HGB 7.4 (L) 09/05/2015 1350   HCT 32.2 (L) 06/19/2017 0417   HCT 27.1 (L) 09/05/2015 1350   PLT 188 06/19/2017 0417   PLT 241 09/05/2015 1350   MCV 99.7 06/19/2017 0417   MCV 71.9 (L) 09/05/2015 1350   MCH 30.3 06/19/2017 0417   MCHC 30.4 06/19/2017 0417   RDW 20.4 (H) 06/19/2017 0417   RDW 20.3 (H) 09/05/2015 1350   LYMPHSABS 2.6 06/17/2017 0719   LYMPHSABS 3.0 09/05/2015 1350   MONOABS 0.5 06/17/2017 0719   MONOABS 0.9 09/05/2015 1350   EOSABS 0.4 06/17/2017 0719   EOSABS 0.2 09/05/2015 1350   BASOSABS 0.0 06/17/2017 0719   BASOSABS 0.0 09/05/2015 1350   CMP     Component Value Date/Time   NA 142 06/19/2017 0417   NA 142 09/05/2015 1350   K 3.6 06/19/2017 0417   K 4.0 09/05/2015 1350   CL 110 06/19/2017 0417   CO2 27 06/19/2017 0417   CO2 22 09/05/2015 1350   GLUCOSE 93 06/19/2017 0417   GLUCOSE 87 09/05/2015 1350   BUN 10 06/19/2017 0417   BUN 10.1 09/05/2015 1350   CREATININE 0.50 06/19/2017 0417   CREATININE 0.7 09/05/2015 1350   CALCIUM 8.5 (L) 06/19/2017 0417   CALCIUM 8.8 06/17/2017 0719   CALCIUM 8.7 09/05/2015 1350   PROT 5.5 (L) 06/17/2017 0719   PROT 6.0 (L) 09/05/2015 1350   ALBUMIN 3.2 (L) 06/17/2017 0719   ALBUMIN 2.8 (L) 09/05/2015 1350   AST 15 06/17/2017 0719   AST 10 09/05/2015 1350   ALT 11 (L) 06/17/2017 0719   ALT <9 09/05/2015 1350   ALKPHOS 122 06/17/2017 0719   ALKPHOS 78 09/05/2015 1350   BILITOT 0.5 06/17/2017 0719   BILITOT <0.30  09/05/2015 1350   GFRNONAA >60 06/19/2017 0417   GFRAA >60 06/19/2017 0417   UPPER GI SERIES WITH KUB   TECHNIQUE: After obtaining a scout radiograph a routine upper GI series was performed using thin and high density barium.   FLUOROSCOPY TIME:  Fluoroscopy Time:  3 minutes 8 seconds   38.1 mGy   COMPARISON:  None.   FINDINGS: Preliminary KUB was obtained demonstrating an overall nonobstructive bowel gas pattern. No gastric distension. Gas-filled loops of large bowel within the LEFT abdomen are upper normal in caliber. Moderate amount of stool and gas throughout the remainder of the normal caliber colon. Stimulator apparatus overlies the LEFT lower quadrant with RIGHT are traversing the RIGHT sacrum. Surgical clips at the gastroesophageal junction. No evidence of abnormal fluid collection. No evidence of free intraperitoneal air. Lung bases are clear. No acute appearing osseous abnormality.   Patient was initially administered thick insistence the oral contrast while evaluating the hypopharynx and cervical esophagus with fluoroscopy. Contrast moved promptly to the cervical esophagus without obstruction or dysmotility. There was no mass, ulceration or other focal wall irregularity identified within the hypopharynx or cervical esophagus. No aspiration event.   Patient was then administered air-contrast crystals followed by thin consistency oral contrast while evaluating the thoracic esophagus. Contrast moved promptly through the thoracic esophagus without obstruction or dysmotility. GE junction appeared somewhat patulous intermittently and a small hiatal hernia was seen with Valsalva maneuvers. No gastroesophageal reflux visualized during the exam.   Spot views of the stomach and proximal duodenum or then obtained. Surgical changes compatible with partial gastrectomy, however, stomach appeared otherwise within normal limits in caliber and configuration without mass,  ulceration or other focal wall irregularity. Contrast moved promptly into the small bowel without evidence of obstruction or dysmotility. No mass, ulceration or other focal wall irregularity was seen at the duodenal bulb.   IMPRESSION: 1. Intermittent patulous appearance of the gastroesophageal junction suggesting at least some disruption/malfunctioning of the lower esophageal sphincter, however, no gastroesophageal reflux was visualized during today's exam. Small hiatal hernia. 2. Normal appearance of the hypopharynx, cervical esophagus and thoracic esophagus. 3. Surgical changes of partial gastrectomy. Stomach otherwise within normal limits in caliber and configuration without mass, ulceration or other focal wall irregularity. 4. Normal appearance of the duodenal bulb without mass, ulceration or other focal wall irregularity.     Electronically Signed   By: Franki Cabot M.D.   On: 08/02/2018 10:11     Assessment & Plan:  48 year old female with a past medical history of GERD, peptic ulcer disease with GOO status post partial gastrectomy with bilateral vagotomy in June 2018, chronic constipation, NSAID use/abuse, interstitial cystitis, hypothyroidism, anxiety and depression, migraine headaches who is seen for follow-up.   1. GERD --she has frequent regurgitation symptoms though not much heartburn.  She is not on PPI therapy.  We reviewed her upper GI series today and her esophagus is patulous at the GE junction along with a small hiatal hernia.  This very likely worsens her reflux symptoms.  Unclear how much acid production she really has after vagotomy but she may be having bile reflux given her prior antrectomy.  May also have a subtle stricture in need of dilation given her dysphagia symptom.  I recommended we proceed to upper endoscopy.  We discussed the risk, benefits and alternatives to upper endoscopy and esophageal dilation and she is agreeable and wishes to proceed.  2.   Nausea/vomiting/inability to gain weight/history of peptic ulcer disease with GOO status post antrectomy and vagotomy/ongoing NSAID use --I am highly suspicious for gastritis either NSAID related or bowel related.  I am also suspicious for gastroparesis in the setting of her prior antrectomy and vagotomy.  She reports that her symptoms of nausea vomiting and inability to  eat have all been present since her gastric surgery.  These symptoms are also indications for the upper endoscopy discussed above.  We will evaluate the gastroduodenal anastomosis and ensure there is no stenosis at this area.  Also wonder if she would benefit from antireflux surgery such as TIF  3.  Chronic constipation --may in part be related to her overall GI motility after vagotomy.  Certainly is chronic.  Linzess was causing diarrhea though I explained when she does not have a bowel movement for as long as 2 weeks it is very frequent to have hard stool followed by loose overflow type diarrhea.  Either way it sounds like Linzess was not working well for her.  Previous nonresponse to MiraLAX.  We will try Amitiza 8 mcg twice daily with food.  4.  Anxiety and depression --this is an issue for her now and she reports her mood is not great.  She does take Seroquel and trazodone.  This is primarily through Cyndi Bender her primary provider.  She has seen psychiatry and psychology in the past.  I recommended referral to [facility] behavioral health but she would like to think about this rather than proceeding to this appointment.  I think it would help tremendously for her to see behavioral health.  Her mother, also previously my patient, died earlier this month and they were best friends.  She is struggling with this grief process as well.  5.  Hypothyroidism --repeat TSH today.  If she is still hypothyroid then this would likely be worsening constipation.  6.  Anemia --normocytic.  No overt blood loss.  Repeat blood counts and check iron  studies plus celiac panel.  I am also checking CBC, CMP, iron studies and celiac panel.  45 minutes spent with the patient today. Greater than 50% was spent in counseling and coordination of care with the patient

## 2018-09-06 NOTE — Patient Instructions (Addendum)
You have been scheduled for an endoscopy. Please follow written instructions given to you at your visit today. If you use inhalers (even only as needed), please bring them with you on the day of your procedure. Your physician has requested that you go to www.startemmi.com and enter the access code given to you at your visit today. This web site gives a general overview about your procedure. However, you should still follow specific instructions given to you by our office regarding your preparation for the procedure.  Discontinue Linzess.  We have sent the following medications to your pharmacy for you to pick up at your convenience: Amitiza 8 mcg twice daily with food  If you are age 46 or older, your body mass index should be between 23-30. Your Body mass index is 16.44 kg/m. If this is out of the aforementioned range listed, please consider follow up with your Primary Care Provider.  If you are age 26 or younger, your body mass index should be between 19-25. Your Body mass index is 16.44 kg/m. If this is out of the aformentioned range listed, please consider follow up with your Primary Care Provider.   Your provider has requested that you go to the basement level for lab work before leaving today. Press "B" on the elevator. The lab is located at the first door on the left as you exit the elevator.

## 2018-09-07 LAB — IBC + FERRITIN
Ferritin: 18.8 ng/mL (ref 10.0–291.0)
Iron: 48 ug/dL (ref 42–145)
Saturation Ratios: 14.6 % — ABNORMAL LOW (ref 20.0–50.0)
Transferrin: 235 mg/dL (ref 212.0–360.0)

## 2018-09-07 LAB — COMPREHENSIVE METABOLIC PANEL
ALT: 11 U/L (ref 0–35)
AST: 16 U/L (ref 0–37)
Albumin: 4 g/dL (ref 3.5–5.2)
Alkaline Phosphatase: 77 U/L (ref 39–117)
BUN: 14 mg/dL (ref 6–23)
CO2: 23 mEq/L (ref 19–32)
Calcium: 8.7 mg/dL (ref 8.4–10.5)
Chloride: 108 mEq/L (ref 96–112)
Creatinine, Ser: 0.68 mg/dL (ref 0.40–1.20)
GFR: 92.93 mL/min (ref >60.00)
Glucose, Bld: 79 mg/dL (ref 70–99)
Potassium: 3.9 mEq/L (ref 3.5–5.1)
Sodium: 140 mEq/L (ref 135–145)
Total Bilirubin: 0.2 mg/dL (ref 0.2–1.2)
Total Protein: 6.2 g/dL (ref 6.0–8.3)

## 2018-09-07 LAB — TISSUE TRANSGLUTAMINASE, IGA: (tTG) Ab, IgA: 1 U/mL

## 2018-09-07 LAB — IGA: IgA: 112 mg/dL (ref 68–378)

## 2018-09-07 LAB — TSH: TSH: 6.44 u[IU]/mL — ABNORMAL HIGH (ref 0.35–4.50)

## 2018-09-14 ENCOUNTER — Telehealth: Payer: Self-pay

## 2018-09-14 ENCOUNTER — Encounter: Payer: Self-pay | Admitting: Internal Medicine

## 2018-09-14 NOTE — Telephone Encounter (Signed)
Pt vomited yesterday and the emesis was very yellow. She wanted to know why it was so yellow. Discussed with her it depends on the foods she ate. Pt has taken zofran and phenergan but states they have not helped. She knows to keep her EGD as scheduled.

## 2018-09-15 ENCOUNTER — Telehealth: Payer: Self-pay | Admitting: Internal Medicine

## 2018-09-15 NOTE — Telephone Encounter (Signed)
Pt returned your call from yesterday, pls call her again.

## 2018-09-15 NOTE — Telephone Encounter (Signed)
I have spoken to patient to reschedule. I offered appointment for 09/21/18 with another provider as she is symptomatic. However, patient requests to await Dr Vena Rua next availability. Patient has been rescheduled to 09/29/18 at 430 pm. She verbalizes understanding.

## 2018-09-22 ENCOUNTER — Encounter: Payer: PPO | Admitting: Internal Medicine

## 2018-09-26 ENCOUNTER — Ambulatory Visit (AMBULATORY_SURGERY_CENTER): Payer: PPO | Admitting: Internal Medicine

## 2018-09-26 ENCOUNTER — Other Ambulatory Visit: Payer: Self-pay

## 2018-09-26 ENCOUNTER — Encounter: Payer: Self-pay | Admitting: Internal Medicine

## 2018-09-26 VITALS — BP 112/67 | HR 62 | Temp 98.5°F | Resp 11 | Ht 59.0 in | Wt 81.0 lb

## 2018-09-26 DIAGNOSIS — K2210 Ulcer of esophagus without bleeding: Secondary | ICD-10-CM | POA: Diagnosis not present

## 2018-09-26 DIAGNOSIS — R1310 Dysphagia, unspecified: Secondary | ICD-10-CM | POA: Diagnosis not present

## 2018-09-26 DIAGNOSIS — K208 Other esophagitis: Secondary | ICD-10-CM

## 2018-09-26 DIAGNOSIS — R112 Nausea with vomiting, unspecified: Secondary | ICD-10-CM | POA: Diagnosis not present

## 2018-09-26 DIAGNOSIS — K2970 Gastritis, unspecified, without bleeding: Secondary | ICD-10-CM | POA: Diagnosis not present

## 2018-09-26 DIAGNOSIS — K219 Gastro-esophageal reflux disease without esophagitis: Secondary | ICD-10-CM | POA: Diagnosis not present

## 2018-09-26 DIAGNOSIS — K2950 Unspecified chronic gastritis without bleeding: Secondary | ICD-10-CM | POA: Diagnosis not present

## 2018-09-26 MED ORDER — METOCLOPRAMIDE HCL 5 MG PO TABS: 5.0000 mg | ORAL_TABLET | Freq: Four times a day (QID) | ORAL | 1 refills | Status: DC

## 2018-09-26 MED ORDER — PANTOPRAZOLE SODIUM 40 MG PO TBEC: 40.0000 mg | DELAYED_RELEASE_TABLET | Freq: Two times a day (BID) | ORAL | 3 refills | Status: DC

## 2018-09-26 MED ORDER — SODIUM CHLORIDE 0.9 % IV SOLN: 500.0000 mL | Freq: Once | INTRAVENOUS | Status: DC

## 2018-09-26 MED ORDER — SUCRALFATE 1 GM/10ML PO SUSP: 1.0000 g | Freq: Four times a day (QID) | ORAL | 1 refills | Status: DC

## 2018-09-26 NOTE — Op Note (Signed)
[facility] Patient Name: [name] Procedure Date: 09/26/2018 4:54 PM MRN: [id number] Endoscopist: Jerene Bears , MD Age: 48 Referring MD:  Date of Birth: [birth year] Gender: Female Account #: [id number] Procedure:                Upper GI endoscopy Indications:              Esophageal reflux, Nausea with vomiting, personal                            history of chronic gastric ulcer s/p antrectomy and                            vagotomy, malnutrition Medicines:                Monitored Anesthesia Care Procedure:                Pre-Anesthesia Assessment:                           - Prior to the procedure, a History and Physical                            was performed, and patient medications and                            allergies were reviewed. The patient's tolerance of                            previous anesthesia was also reviewed. The risks                            and benefits of the procedure and the sedation                            options and risks were discussed with the patient.                            All questions were answered, and informed consent                            was obtained. Prior Anticoagulants: The patient has                            taken no previous anticoagulant or antiplatelet                            agents. ASA Grade Assessment: III - A patient with                            severe systemic disease. After reviewing the risks                            and benefits, the patient was deemed in  satisfactory condition to undergo the procedure.                           After obtaining informed consent, the endoscope was                            passed under direct vision. Throughout the                            procedure, the patient's blood pressure, pulse, and                            oxygen saturations were monitored continuously. The                            Endoscope was introduced  through the mouth, and                            advanced to the second part of duodenum. The upper                            GI endoscopy was accomplished without difficulty.                            The patient tolerated the procedure well. Scope In: Scope Out: Findings:                 LA Grade D (one or more mucosal breaks involving at                            least 75% of esophageal circumference) esophagitis                            with no bleeding was found 25 to 40 cm from the                            incisors. Biopsies were taken with a cold forceps                            for histology.                           Evidence of an antrectomy was found in the gastric                            body. This was characterized by visible sutures and                            the presence of no stomal ulceration or stenosis.                            The suture material was removed with cold forceps  to the fullest extent possible.                           A area of scarring most likely from prior PEG was                            found in the gastric body.                           Diffuse moderately erythematous mucosa without                            bleeding was found in the gastric body. Biopsies                            were taken with a cold forceps for histology and                            Helicobacter pylori testing.                           The examined duodenum was normal. Complications:            No immediate complications. Estimated Blood Loss:     Estimated blood loss was minimal. Impression:               - LA Grade D acute and erosive esophagitis.                            Biopsied.                           - An antrectomy was found, characterized by visible                            sutures and no stomal ulceration. Suture material                            removed.                           - Scar in the gastric  body.                           - Erythematous mucosa in the gastric body. Biopsied.                           - Normal examined duodenum. Recommendation:           - Patient has a contact number available for                            emergencies. The signs and symptoms of potential                            delayed complications were discussed with the  patient. Return to normal activities tomorrow.                            Written discharge instructions were provided to the                            patient.                           - Continue present medications.                           - Restart PPI (pantoprazole 40 mg twice daily).                            Restart sucralfate liquid three times daily and at                            bedtime.                           - Trial of metoclopramide 5 mg three time daily and                            at bedtime.                           - Await pathology results.                           - Office followup in about 4 weeks. Jerene Bears, MD 09/26/2018 5:28:25 PM This report has been signed electronically.

## 2018-09-26 NOTE — Progress Notes (Signed)
Called to room to assist during endoscopic procedure.  Patient ID and intended procedure confirmed with present staff. Received instructions for my participation in the procedure from the performing physician.  

## 2018-09-26 NOTE — Progress Notes (Signed)
Report to PACU, RN, vss, BBS= Clear.  

## 2018-09-26 NOTE — Patient Instructions (Signed)
Impression/Recommendations:  Esophagitis handout given to patient.  Continue present medications.  Restart PPI (Pantoprazole 40 mg twice daily) Restart sucralfate liquid 3 times daily and at bedtime. Trial of Metoclopramide 5 mg. 3 times daily and at bedtime.  Await pathology results.  Office follow-up in about 4 weeks.  YOU HAD AN ENDOSCOPIC PROCEDURE TODAY AT [facility]:   Refer to the procedure report that was given to you for any specific questions about what was found during the examination.  If the procedure report does not answer your questions, please call your gastroenterologist to clarify.  If you requested that your care partner not be given the details of your procedure findings, then the procedure report has been included in a sealed envelope for you to review at your convenience later.  YOU SHOULD EXPECT: Some feelings of bloating in the abdomen. Passage of more gas than usual.  Walking can help get rid of the air that was put into your GI tract during the procedure and reduce the bloating. If you had a lower endoscopy (such as a colonoscopy or flexible sigmoidoscopy) you may notice spotting of blood in your stool or on the toilet paper. If you underwent a bowel prep for your procedure, you may not have a normal bowel movement for a few days.  Please Note:  You might notice some irritation and congestion in your nose or some drainage.  This is from the oxygen used during your procedure.  There is no need for concern and it should clear up in a day or so.  SYMPTOMS TO REPORT IMMEDIATELY:   Following upper endoscopy (EGD)  Vomiting of blood or coffee ground material  New chest pain or pain under the shoulder blades  Painful or persistently difficult swallowing  New shortness of breath  Fever of 100F or higher  Black, tarry-looking stools  For urgent or emergent issues, a gastroenterologist can be reached at any hour by calling [phone].   DIET:   We do recommend a small meal at first, but then you may proceed to your regular diet.  Drink plenty of fluids but you should avoid alcoholic beverages for 24 hours.  ACTIVITY:  You should plan to take it easy for the rest of today and you should NOT DRIVE or use heavy machinery until tomorrow (because of the sedation medicines used during the test).    FOLLOW UP: Our staff will call the number listed on your records 48-72 hours following your procedure to check on you and address any questions or concerns that you may have regarding the information given to you following your procedure. If we do not reach you, we will leave a message.  We will attempt to reach you two times.  During this call, we will ask if you have developed any symptoms of COVID 19. If you develop any symptoms (ie: fever, flu-like symptoms, shortness of breath, cough etc.) before then, please call [phone].  If you test positive for Covid 19 in the 2 weeks post procedure, please call and report this information to [place].    If any biopsies were taken you will be contacted by phone or by letter within the next 1-3 weeks.  Please call us at [phone] if you have not heard about the biopsies in 3 weeks.    SIGNATURES/CONFIDENTIALITY: You and/or your care partner have signed paperwork which will be entered into your electronic medical record.  These signatures attest to the fact that that the information  above on your After Visit Summary has been reviewed and is understood.  Full responsibility of the confidentiality of this discharge information lies with you and/or your care-partner.

## 2018-09-26 NOTE — Progress Notes (Signed)
Temp taken by JB VS taken by CW 

## 2018-09-28 ENCOUNTER — Telehealth: Payer: Self-pay | Admitting: *Deleted

## 2018-09-28 NOTE — Telephone Encounter (Signed)
First follow up call attempt.  Reached non-identifying answering machine.  No message left. 

## 2018-09-28 NOTE — Telephone Encounter (Signed)
  Follow up Call-  Call back number 09/26/2018  Post procedure Call Back phone  # [phone]  Permission to leave phone message Yes  Some recent data might be hidden     Patient questions:  Do you have a fever, pain , or abdominal swelling? No. Pain Score  0 *  Have you tolerated food without any problems? Yes.    Have you been able to return to your normal activities? Yes.    Do you have any questions about your discharge instructions: Diet   No. Medications  No. Follow up visit  No.  Do you have questions or concerns about your Care? No.  Actions: * If pain score is 4 or above: No action needed, pain <4.  Have you developed a fever since your procedure? No   2.   Have you had an respiratory symptoms (SOB or cough) since your procedure? no  3.   Have you tested positive for COVID 19 since your procedure no  4.   Have you had any family members/close contacts diagnosed with the COVID 19 since your procedure?  no   If yes to any of these questions please route to Joylene John, RN and Alphonsa Gin, Therapist, sports.

## 2018-10-05 ENCOUNTER — Telehealth: Payer: Self-pay | Admitting: Internal Medicine

## 2018-10-05 NOTE — Telephone Encounter (Signed)
See result note.  

## 2018-10-05 NOTE — Telephone Encounter (Signed)
See note below and advise. 

## 2018-10-05 NOTE — Telephone Encounter (Signed)
Pt calling about path results. °

## 2018-10-19 ENCOUNTER — Encounter: Payer: Self-pay | Admitting: *Deleted

## 2018-11-01 ENCOUNTER — Ambulatory Visit (INDEPENDENT_AMBULATORY_CARE_PROVIDER_SITE_OTHER): Payer: PPO | Admitting: Internal Medicine

## 2018-11-01 ENCOUNTER — Encounter: Payer: Self-pay | Admitting: Internal Medicine

## 2018-11-01 VITALS — BP 104/62 | HR 74 | Temp 98.5°F | Ht 59.0 in | Wt 82.2 lb

## 2018-11-01 DIAGNOSIS — Z9889 Other specified postprocedural states: Secondary | ICD-10-CM | POA: Diagnosis not present

## 2018-11-01 DIAGNOSIS — Z8711 Personal history of peptic ulcer disease: Secondary | ICD-10-CM | POA: Diagnosis not present

## 2018-11-01 DIAGNOSIS — K2100 Gastro-esophageal reflux disease with esophagitis, without bleeding: Secondary | ICD-10-CM | POA: Diagnosis not present

## 2018-11-01 DIAGNOSIS — Z903 Acquired absence of stomach [part of]: Secondary | ICD-10-CM

## 2018-11-01 DIAGNOSIS — K5909 Other constipation: Secondary | ICD-10-CM | POA: Diagnosis not present

## 2018-11-01 DIAGNOSIS — R112 Nausea with vomiting, unspecified: Secondary | ICD-10-CM | POA: Diagnosis not present

## 2018-11-01 MED ORDER — METOCLOPRAMIDE HCL 5 MG PO TABS: 5.0000 mg | ORAL_TABLET | Freq: Four times a day (QID) | ORAL | 2 refills | Status: DC

## 2018-11-01 MED ORDER — SUCRALFATE 1 GM/10ML PO SUSP: 1.0000 g | Freq: Four times a day (QID) | ORAL | 3 refills | Status: DC

## 2018-11-01 MED ORDER — PANTOPRAZOLE SODIUM 40 MG PO TBEC: 40.0000 mg | DELAYED_RELEASE_TABLET | Freq: Two times a day (BID) | ORAL | 3 refills | Status: DC

## 2018-11-01 MED ORDER — LUBIPROSTONE 24 MCG PO CAPS: 24.0000 ug | ORAL_CAPSULE | Freq: Two times a day (BID) | ORAL | 3 refills | Status: DC

## 2018-11-01 NOTE — Patient Instructions (Signed)
We have sent the following medications to your pharmacy for you to pick up at your convenience: Amitiza 24 mcg twice daily (increase from previous dosing) Pantoprazole 40 mg twice daily Metoclopramide 5 mg four times daily Sucralfate 1 gram 4 times daily  Discontinue BC Powder entirely!  Make sure to eat smaller, more frequent meals.  We will speak with Dr Bryan Lemma regarding possible TIF procedure and will be in contact with you about this.  If you are age 81 or older, your body mass index should be between 23-30. Your Body mass index is 16.61 kg/m. If this is out of the aforementioned range listed, please consider follow up with your Primary Care Provider.  If you are age 29 or younger, your body mass index should be between 19-25. Your Body mass index is 16.61 kg/m. If this is out of the aformentioned range listed, please consider follow up with your Primary Care Provider.

## 2018-11-01 NOTE — Progress Notes (Addendum)
Subjective:    Patient ID: [name], female    DOB: 03-Apr-1970, 48 y.o.   MRN: [id number]  HPI [name] is a 48 year old female with a history of GERD with esophagitis, peptic ulcer disease causing gastric outlet obstruction status post partial gastrectomy with bilateral vagotomy in June 2018, chronic constipation, history of NSAID use and abuse, history of interstitial cystitis, hypothyroidism, migraine headaches, anxiety and depression who is here for follow-up.  She was last seen in the time of her upper endoscopy performed on 09/26/2018.  She is here alone today.  Upper endoscopy on 09/26/2018 showed LA grade D esophagitis covering the distal 15 cm of her esophagus.  There was evidence of an antrectomy in the gastric body with visible sutures some of which were removed.  There was an area of scarring in the more proximal gastric body due to former gastrostomy which has subsequently been removed and healed well.  There was erythematous mucosa in the stomach which was biopsied and the examined duodenum was normal.  Pathology results in the esophagus showed ulcerative esophagitis without microorganisms, dysplasia or malignancy.  There was no evidence of CMV or HSV.  There was chronic inactive gastritis in the stomach without H. Pylori.  After this procedure I changed her medications around and had her on pantoprazole 40 mg twice daily, metoclopramide 5 mg 3 times daily before meals and at bedtime and liquid Carafate 4 times a day.  I also advised that she stop using frequent BC powder as this was felt to be worsening overall symptoms.  She reports that she may be a little bit better.  She is still only eating mainly 1 meal a day at night.  She feels better if she lies down after eating because she has less regurgitation.  She is using pantoprazole but sometimes 3 or 4 times a day, metoclopramide 5 mg 4 times a day and liquid Carafate 4 times a day.  She has had less vomiting still some dry  heaves.  Appetite she reports is very good but she remains concerned that she cannot gain weight.  She has frequent gurgling in her chest related to reflux though she does not feel true heartburn.  She feels the constant need to belch.  Bowel movements have remained infrequent despite trying the Amitiza 8 mcg.  She takes this twice a day some days but does not take the medication every single day.  She does use Dulcolax when she has not had a bowel movement for over a week which seems to work for her.  No rectal bleeding or melena.  Weight is up 1 pound since last office visit.   Review of Systems As per HPI, otherwise negative  Current Medications, Allergies, Past Medical History, Past Surgical History, Family History and Social History were reviewed in [employer] record.     Objective:   Physical Exam BP 104/62 (BP Location: Left Arm, Patient Position: Sitting, Cuff Size: Normal)   Pulse 74   Temp 98.5 F (36.9 C) (Oral)   Ht 4\' 11"  (1.499 m)   Wt 82 lb 4 oz (37.3 kg)   BMI 16.61 kg/m  Gen: awake, alert, NAD HEENT: anicteric CV: RRR, no mrg Pulm: CTA b/l Abd: soft, NT/ND, +BS throughout Ext: no c/c/e Neuro: nonfocal  CBC    Component Value Date/Time   WBC 7.5 09/06/2018 1717   RBC 4.10 09/06/2018 1717   HGB 13.1 09/06/2018 1717   HGB 7.4 (L) 09/05/2015 1350  HCT 40.1 09/06/2018 1717   HCT 27.1 (L) 09/05/2015 1350   PLT 195.0 09/06/2018 1717   PLT 241 09/05/2015 1350   MCV 97.8 09/06/2018 1717   MCV 71.9 (L) 09/05/2015 1350   MCH 30.3 06/19/2017 0417   MCHC 32.8 09/06/2018 1717   RDW 13.9 09/06/2018 1717   RDW 20.3 (H) 09/05/2015 1350   LYMPHSABS 2.9 09/06/2018 1717   LYMPHSABS 3.0 09/05/2015 1350   MONOABS 0.5 09/06/2018 1717   MONOABS 0.9 09/05/2015 1350   EOSABS 0.1 09/06/2018 1717   EOSABS 0.2 09/05/2015 1350   BASOSABS 0.0 09/06/2018 1717   BASOSABS 0.0 09/05/2015 1350   CMP     Component Value Date/Time   NA 140 09/06/2018 1717    NA 142 09/05/2015 1350   K 3.9 09/06/2018 1717   K 4.0 09/05/2015 1350   CL 108 09/06/2018 1717   CO2 23 09/06/2018 1717   CO2 22 09/05/2015 1350   GLUCOSE 79 09/06/2018 1717   GLUCOSE 87 09/05/2015 1350   BUN 14 09/06/2018 1717   BUN 10.1 09/05/2015 1350   CREATININE 0.68 09/06/2018 1717   CREATININE 0.7 09/05/2015 1350   CALCIUM 8.7 09/06/2018 1717   CALCIUM 8.8 06/17/2017 0719   CALCIUM 8.7 09/05/2015 1350   PROT 6.2 09/06/2018 1717   PROT 6.0 (L) 09/05/2015 1350   ALBUMIN 4.0 09/06/2018 1717   ALBUMIN 2.8 (L) 09/05/2015 1350   AST 16 09/06/2018 1717   AST 10 09/05/2015 1350   ALT 11 09/06/2018 1717   ALT <9 09/05/2015 1350   ALKPHOS 77 09/06/2018 1717   ALKPHOS 78 09/05/2015 1350   BILITOT 0.2 09/06/2018 1717   BILITOT <0.30 09/05/2015 1350   GFRNONAA >60 06/19/2017 0417   GFRAA >60 06/19/2017 0417   Celiac panel negative  Iron/TIBC/Ferritin/ %Sat    Component Value Date/Time   IRON 48 09/06/2018 1717   IRON <11 (L) 09/05/2015 1350   TIBC 406 09/05/2015 1350   FERRITIN 18.8 09/06/2018 1717   FERRITIN 4 (L) 09/05/2015 1350   IRONPCTSAT 14.6 (L) 09/06/2018 1717   IRONPCTSAT Not calculated due to Iron < linear range. 09/05/2015 1350       Assessment & Plan:  48 year old female with a history of GERD with esophagitis, peptic ulcer disease causing gastric outlet obstruction status post partial gastrectomy with bilateral vagotomy in June 2018, chronic constipation, history of NSAID use and abuse, history of interstitial cystitis, hypothyroidism, migraine headaches, anxiety and depression who is here for follow-up.   1.  GERD with severe esophagitis/nausea with regurgitation/vomiting --she has severe esophagitis.  Difficult to know if this is more related to acid or bile but it is certainly due to reflux.  She seems to have improved with more consistent PPI but she has been taking this a bit too frequently.  Metoclopramide seems to help as does Carafate.  Her prior  vagotomy sets her up for a gastroparesis type picture as well worsening reflux.  With vagotomy she certainly should have less acid suppression, which leaves the question of whether she has esophagitis due to bile reflux or acid reflux.  We spent a long time today discussing fundoplication which hopefully would help with her ability to eat and regurgitate less allowing her to gain weight and function more normally.  We discussed traditional Nissen fundoplication but also TIF. --Continue pantoprazole 40 mg twice a day, I cautioned her to not use this more than twice a day --Continue metoclopramide, it seems to be helping.  5 mg  3 times daily before meals and at bedtime.  We discussed the long-term, though rare, risk of tardive dyskinesia. --Continue sucralfate suspension 1 g 4 times a day --Smaller more frequent meals is encouraged --Remain off of all BC powder and NSAIDs --I am going to discuss TIPS with Dr. Bryan Lemma to get his opinion on whether she may be a candidate  2.  History of peptic ulcer disease status post antrectomy --intact anatomy without stenosis on recent EGD.  Some inactive gastritis without H. pylori.  Worsened by NSAIDs which she has been cautioned to avoid.  NSAIDs were the etiology of her severe peptic ulcer disease requiring antrectomy 2 years ago.  3.  Chronic constipation --I encourage consistent use of Amitiza though when she was using it twice daily it was not completely effective. --Increase Amitiza to 24 mcg twice daily, consistent twice daily dosing encouraged  4.  Anemia --resolved with normal iron studies.  This is encouraging.  Follow-up in 3 months or so but will also possibly be seeing Dr. Bryan Lemma after he reviews the case  Addendum: I asked Dr. Marlynn Perking to review her case regarding esophagitis and severe GERD as it relates to antireflux procedure TIF.  He has done so and these are his thoughts: "[name],  I looked through her notes and her recent endoscopy.  Looks like some pretty terrible esophagitis and agree that there is some suspicion that it could be bile acid reflux. Traditionally, these patients can get benefit from antireflux surgery, but I think TIF would have a high likelihood of failure given the antrectomy, prior PEG, lack of gastric mobility, along with risk for gastroparesis given prior vagotomy. Would be interesting to see with a pH/impedance off PPI looks like. If this shows significant bile reflux, could be a candidate for lap partial Nissen. But offering a TIF would not likely serve her well. Thanks. Vito"  I appreciate his opinion and thus, I will refer her to general surgery for consideration of anti-reflux fundoplication.

## 2018-11-04 ENCOUNTER — Telehealth: Payer: Self-pay | Admitting: Internal Medicine

## 2018-11-04 NOTE — Telephone Encounter (Signed)
Pt seen Dr. Hilarie Fredrickson on 10/22 and he was supposed to talk to Dr. Bryan Lemma about TIF procedure and contact her but she has not heard anything back yet and she is wanting to know if Dr. Hilarie Fredrickson has heard back and if so what the next steps for her will be.

## 2018-11-04 NOTE — Telephone Encounter (Signed)
See note below and advise. 

## 2018-11-07 NOTE — Telephone Encounter (Signed)
Spoke with pt and she is aware of Dr. Vivia Ewing recommendations along with Dr. Vena Rua. Referral faxed to [facility] for pt to have possible fundoplication surgery. Pt instructed to call our office back if she has not heard from referral in a week.

## 2018-11-07 NOTE — Telephone Encounter (Signed)
See addendum to my last office note with her I sent this along last week, no sure if to [facility] or [facility] Thanks JMP

## 2018-11-11 ENCOUNTER — Telehealth: Payer: Self-pay | Admitting: Internal Medicine

## 2018-11-11 NOTE — Telephone Encounter (Signed)
Pt states that [facility] has not contacted her yet, she is getting frustrated because she keeps loosing wait. Pls call her.

## 2018-11-11 NOTE — Telephone Encounter (Signed)
Message sent to [facility] to check on the status of referral

## 2018-11-11 NOTE — Telephone Encounter (Signed)
Appt Scheduled with Dr.Ramirez on 11/10 at 3:10 she is aware!

## 2018-11-22 DIAGNOSIS — K2100 Gastro-esophageal reflux disease with esophagitis, without bleeding: Secondary | ICD-10-CM | POA: Diagnosis not present

## 2018-11-23 ENCOUNTER — Telehealth: Payer: Self-pay | Admitting: Internal Medicine

## 2018-11-23 NOTE — Telephone Encounter (Signed)
Pt was seen by [facility] and she is not sure what to do. She was told she would have to have several other tests done before the surgery could be scheduled and that if she was not happy with them they could refer her to [facility]. Pt states she just does not know what to do. Offered pt and appt with Dr. Hilarie Fredrickson to come in and talk to him and see what his opinions are. Pt scheduled to see Dr. Hilarie Fredrickson 12/06/18@9 :10am. Pt aware of appt.

## 2018-11-23 NOTE — Telephone Encounter (Signed)
Pt stated that she went to [facility] and was refused service.  Pt is upset and requested a call back to discuss.

## 2018-12-06 ENCOUNTER — Ambulatory Visit: Payer: PPO | Admitting: Internal Medicine

## 2018-12-23 DIAGNOSIS — R112 Nausea with vomiting, unspecified: Secondary | ICD-10-CM | POA: Diagnosis not present

## 2018-12-23 DIAGNOSIS — Z9049 Acquired absence of other specified parts of digestive tract: Secondary | ICD-10-CM | POA: Diagnosis not present

## 2018-12-23 DIAGNOSIS — K209 Esophagitis, unspecified without bleeding: Secondary | ICD-10-CM | POA: Diagnosis not present

## 2019-01-18 ENCOUNTER — Ambulatory Visit: Payer: Medicare Other | Admitting: Internal Medicine

## 2019-01-18 ENCOUNTER — Other Ambulatory Visit: Payer: Medicare Other

## 2019-01-18 ENCOUNTER — Encounter: Payer: Self-pay | Admitting: Internal Medicine

## 2019-01-18 DIAGNOSIS — K2100 Gastro-esophageal reflux disease with esophagitis, without bleeding: Secondary | ICD-10-CM | POA: Diagnosis not present

## 2019-01-18 DIAGNOSIS — R112 Nausea with vomiting, unspecified: Secondary | ICD-10-CM

## 2019-01-18 NOTE — Progress Notes (Signed)
Subjective:    Patient ID: [name], female    DOB: December 20, 1970, 49 y.o.   MRN: [id number]  HPI [name] is a 49 year old female with a past medical history of GERD with severe esophagitis, history of peptic ulcer disease causing gastric outlet obstruction status post partial gastrectomy with bilateral vagotomy in June 18, chronic constipation, history of NSAID use, history of interstitial cystitis, hypothyroidism, migraines, anxiety and depression who is here for follow-up.  She was last seen on 11/01/2018.  I have referred her to see general surgery for consideration of fundoplication giving ongoing severe reflux with nausea and vomiting.  She saw Dr. Rosendo Gros who referred her to [facility] given the complexity of her prior surgical history.  She saw Dr. Irena Cords and Dr. Garner Nash at [facility] on 11/24/2018.  It was their impression that her symptoms may be regurgitation secondary to gastric outlet obstruction rather than traditional GERD and reflux.  They recommended obtaining an upper GI and a gastrin level and then seeing her again.  She reports being confused because she had previously had a upper GI series which I performed in July 2020.  We reviewed these results today.  Indeed this was performed and showed intermittent patulous appearance of the GE junction.  Normal hypopharynx, cervical and thoracic esophagus.  Surgical changes of partial gastrectomy otherwise within normal limits without mass or ulceration.  There was normal appearance of the duodenal bulb without mass or ulceration.  Contrast moved promptly into the small bowel without evidence of obstruction or dysmotility on this study.  She does continue to struggle with severe heartburn, pyrosis and burning in her throat.  She reports within 90 minutes of eating or drinking she has regurgitation which is severe.  This is despite pantoprazole 40 mg twice a day, metoclopramide 5 mg 4 times a day and sucralfate liquid 4 times a day.  She  is miserable and wishes to have this issue corrected.  She was using lubiprostone for constipation which does help but it caused cramping so for now she has been using Dulcolax at night on an as-needed basis which seems to work better without cramping.   Review of Systems As per HPI, otherwise negative  Current Medications, Allergies, Past Medical History, Past Surgical History, Family History and Social History were reviewed in [employer] record.     Objective:   Physical Exam BP 100/62   Pulse 72   Temp (!) 97.1 F (36.2 C)   Ht 4\' 11"  (1.499 m)   Wt 87 lb 6.4 oz (39.6 kg)   BMI 17.65 kg/m  Gen: awake, alert, NAD, thin HEENT: anicteric, op clear CV: RRR, no mrg Pulm: CTA b/l Abd: soft, scaphoid, NT/ND, +BS throughout Ext: no c/c/e Neuro: nonfocal  UPPER GI SERIES WITH KUB   TECHNIQUE: After obtaining a scout radiograph a routine upper GI series was performed using thin and high density barium.   FLUOROSCOPY TIME:  Fluoroscopy Time:  3 minutes 8 seconds   38.1 mGy   COMPARISON:  None.   FINDINGS: Preliminary KUB was obtained demonstrating an overall nonobstructive bowel gas pattern. No gastric distension. Gas-filled loops of large bowel within the LEFT abdomen are upper normal in caliber. Moderate amount of stool and gas throughout the remainder of the normal caliber colon. Stimulator apparatus overlies the LEFT lower quadrant with RIGHT are traversing the RIGHT sacrum. Surgical clips at the gastroesophageal junction. No evidence of abnormal fluid collection. No evidence of free intraperitoneal air. Lung  bases are clear. No acute appearing osseous abnormality.   Patient was initially administered thick insistence the oral contrast while evaluating the hypopharynx and cervical esophagus with fluoroscopy. Contrast moved promptly to the cervical esophagus without obstruction or dysmotility. There was no mass, ulceration or other focal wall  irregularity identified within the hypopharynx or cervical esophagus. No aspiration event.   Patient was then administered air-contrast crystals followed by thin consistency oral contrast while evaluating the thoracic esophagus. Contrast moved promptly through the thoracic esophagus without obstruction or dysmotility. GE junction appeared somewhat patulous intermittently and a small hiatal hernia was seen with Valsalva maneuvers. No gastroesophageal reflux visualized during the exam.   Spot views of the stomach and proximal duodenum or then obtained. Surgical changes compatible with partial gastrectomy, however, stomach appeared otherwise within normal limits in caliber and configuration without mass, ulceration or other focal wall irregularity. Contrast moved promptly into the small bowel without evidence of obstruction or dysmotility. No mass, ulceration or other focal wall irregularity was seen at the duodenal bulb.   IMPRESSION: 1. Intermittent patulous appearance of the gastroesophageal junction suggesting at least some disruption/malfunctioning of the lower esophageal sphincter, however, no gastroesophageal reflux was visualized during today's exam. Small hiatal hernia. 2. Normal appearance of the hypopharynx, cervical esophagus and thoracic esophagus. 3. Surgical changes of partial gastrectomy. Stomach otherwise within normal limits in caliber and configuration without mass, ulceration or other focal wall irregularity. 4. Normal appearance of the duodenal bulb without mass, ulceration or other focal wall irregularity.     Electronically Signed   By: Franki Cabot M.D.   On: 08/02/2018 10:11      Assessment & Plan:  49 year old female with a past medical history of GERD with severe esophagitis, history of peptic ulcer disease causing gastric outlet obstruction status post partial gastrectomy with bilateral vagotomy in June 18, chronic constipation, history of NSAID use,  history of interstitial cystitis, hypothyroidism, migraines, anxiety and depression who is here for follow-up.  1. GERD/regurgitation/heartburn --her symptoms are severe and her esophagitis was severe at upper endoscopy in September 2020.  She did meet with general surgery who had concern for possible gastric outlet obstruction rather than true reflux.  They did not have the available upper GI series which showed postsurgical gastric anatomy but no evidence for gastric outlet obstruction as contrast move promptly into the small bowel.  The GE junction is patulous.  I do think this is reflux and I do think she has gastric motility issues related to vagotomy.  This exacerbates her symptoms.  I still wonder if a Nissen fundoplication would improve her symptoms but this would be high risk given her prior surgery and likely somewhat complex.  I agree with having this evaluated at a tertiary center.  In order to make this available I am going to proceed with a repeat upper GI series with small bowel follow-through along with 24-hour pH testing and high-resolution esophageal manometry.  The latter will be performed off of PPI for 1 week and also off of metoclopramide and sucralfate.  Check gastrin today  After we have the above data we will consider return to surgical opinion at either [facility] or [place]

## 2019-01-18 NOTE — Patient Instructions (Addendum)
If you are age 47 or older, your body mass index should be between 23-30. Your Body mass index is 17.65 kg/m. If this is out of the aforementioned range listed, please consider follow up with your Primary Care Provider.  If you are age 56 or younger, your body mass index should be between 19-25. Your Body mass index is 17.65 kg/m. If this is out of the aformentioned range listed, please consider follow up with your Primary Care Provider.   You have been scheduled for an Upper GI Series at [facility]. Your appointment is on 01/26/2019 at 10:30am. Please arrive 15 minutes prior to your test for registration. Make sure not to eat or drink anything after midnight on the night before your test. If you need to reschedule, please call radiology at [phone]. ________________________________________________________________ An upper GI series uses x rays to help diagnose problems of the upper GI tract, which includes the esophagus, stomach, and duodenum. The duodenum is the first part of the small intestine. An upper GI series is conducted by a radiology technologist or a radiologist--a doctor who specializes in x-ray imaging--at a hospital or outpatient center. While sitting or standing in front of an x-ray machine, the patient drinks barium liquid, which is often white and has a chalky consistency and taste. The barium liquid coats the lining of the upper GI tract and makes signs of disease show up more clearly on x rays. X-ray video, called fluoroscopy, is used to view the barium liquid moving through the esophagus, stomach, and duodenum. Additional x rays and fluoroscopy are performed while the patient lies on an x-ray table. To fully coat the upper GI tract with barium liquid, the technologist or radiologist may press on the abdomen or ask the patient to change position. Patients hold still in various positions, allowing the technologist or radiologist to take x rays of the upper GI tract at  different angles. If a technologist conducts the upper GI series, a radiologist will later examine the images to look for problems.  This test typically takes about 1 hour to complete. __________________________________________________________________ The Small Bowel Follow Thru examination is used to visualize the entire small bowel (intestines); specifically the connection between the small and large intestine. You will be positioned on a flat x-ray table and an image of your abdomen taken. Then the technologist will show the x-ray to the radiologist. The radiologist will instruct your technologist how much (1-2 cups) barium sulfate you will drink and when to begin taking the timed x-rays, usually 15-30 minutes after you begin drinking. Barium is a harmless substance that will highlight your small intestine by absorbing x-ray. The taste is chalky and it feels very heavy both in the cup and in your stomach.  After the first x-ray is taken and shown to the radiologist, he/she will determine when the next image is to be taken. This is repeated until the barium has reached the end of the small intestine and enters the beginning of the colon (cecum). At such time when the barium spills into the colon, you will be positioned on the x-ray table once again. The radiologist will use a fluoroscopic camera to take some detailed pictures of the connection between your small intestine and colon. The fluoroscope is an x-ray unit that works with a television/computer screen. The radiologist will apply pressure to your abdomen with his/her hand and a lead glove, a plastic paddle, or a paddle with an inflated rubber balloon on the end. This is to  spread apart your loops of intestine so he/she can see all areas.   This test typically takes around 1 hour to complete.  **Important** Drink plenty of water (8-10 cups/day) for a few days following the procedure to avoid constipation and blockage. The barium will make your stools  white for a few days. ____________________________________________________________________  Your provider has requested that you go to the basement level for lab work before leaving today. Press "B" on the elevator. The lab is located at the first door on the left as you exit the elevator.  _________________________________________________________  [name] have been scheduled for an esophageal manometry and 24 hour PH Probe test at [facility] on 02/01/2019 at 8:30am. Please arrive 30 minutes prior to your procedure for registration. You will need to go to outpatient registration (1st floor of the hospital) first. Make certain to bring your insurance cards as well as a complete list of medications.  Please remember the following:  1) Do not take any muscle relaxants, xanax (alprazolam) or ativan for 1 day prior to your test as well as the day of the test.  2) Nothing to eat or drink after 12:00 midnight on the night before your test.  3) Hold all diabetic medications/insulin the morning of the test. You may eat and take your medications after the test.  4) For 7 days prior to your test do not take: Dexilant, Prevacid, Nexium, Protonix, Aciphex, Zegerid, Pantoprazole, Prilosec or omeprazole.  5) For 24 hours prior to your test and throughout the study do not take: Reglan, Tagamet, Zantac, Axid or Pepcid.  6) You MAY use an antacid such as Rolaids or Tums up to 12 hours prior to your test.  7) Hold Carafate 24 hours prior to your test, and throughout the study.  It will take at least 2 weeks to receive the results of this test from your physician.  ------------------------------------------ ABOUT ESOPHAGEAL MANOMETRY Esophageal manometry (muh-NOM-uh-tree) is a test that gauges how well your esophagus works. Your esophagus is the long, muscular tube that connects your throat to your stomach. Esophageal manometry measures the rhythmic muscle contractions (peristalsis) that occur in your  esophagus when you swallow. Esophageal manometry also measures the coordination and force exerted by the muscles of your esophagus.  During esophageal manometry, a thin, flexible tube (catheter) that contains sensors is passed through your nose, down your esophagus and into your stomach. Esophageal manometry can be helpful in diagnosing some mostly uncommon disorders that affect your esophagus.  Why it's done Esophageal manometry is used to evaluate the movement (motility) of food through the esophagus and into the stomach. The test measures how well the circular bands of muscle (sphincters) at the top and bottom of your esophagus open and close, as well as the pressure, strength and pattern of the wave of esophageal muscle contractions that moves food along.  What you can expect Esophageal manometry is an outpatient procedure done without sedation. Most people tolerate it well. You may be asked to change into a hospital gown before the test starts.  During esophageal manometry  . While you are sitting up, a member of your health care team sprays your throat with a numbing medication or puts numbing gel in your nose or both.  . A catheter is guided through your nose into your esophagus. The catheter may be sheathed in a water-filled sleeve. It doesn't interfere with your breathing. However, your eyes may water, and you may gag. You may have a slight nosebleed from  irritation.  . After the catheter is in place, you may be asked to lie on your back on an exam table, or you may be asked to remain seated.  . You then swallow small sips of water. As you do, a computer connected to the catheter records the pressure, strength and pattern of your esophageal muscle contractions.  . During the test, you'll be asked to breathe slowly and smoothly, remain as still as possible, and swallow only when you're asked to do so.  . A member of your health care team may move the catheter down into your stomach while the  catheter continues its measurements.  . The catheter then is slowly withdrawn. The test usually lasts 20 to 30 minutes.  After esophageal manometry  When your esophageal manometry is complete, you may return to your normal activities  This test typically takes 30-45 minutes to complete. ________________________________________________________________________________  ABOUT 24 HOUR PH PROBE An esophageal pH test measures and records the pH in your esophagus to determine if you have gastroesophageal reflux disease (GERD). The test can also be done to determine the effectiveness of medications or surgical treatment for GERD. What is esophageal reflux? Esophageal reflux is a condition in which stomach acid refluxes or moves back into the esophagus (the "food pipe" leading from the mouth to the stomach). How does the esophageal pH test work? A thin, small tube with an acid sensing device on the tip is gently passed through your nose, down the esophagus ("food tube"), and positioned about 2 inches above the lower esophageal sphincter. The tube is secured to the side of your face with clear tape. The end of the tube exiting from your nose is attached to a portable recorder that is worn on your belt or over your shoulder. The recorder has several buttons on it that you will press to mark certain events. A nurse will review the monitoring instructions with you. Once the test has begun, what do I need to know and do? Marland Kitchen Activity: Follow your usual daily routine. Do not reduce or change your activities during the monitoring period. Doing so can make the monitoring results less useful.  . Note: do not take a tub bath or shower; the equipment can't get wet.  . Eating: Eat your regular meals at the usual times. If you do not eat during the monitoring period, your stomach will not produce acid as usual, and the test results will not be accurate. Eat at least 2 meals a day. Eat foods that tend to increase your  symptoms (without making yourself miserable). Avoid snacking. Do not suck on hard candy or lozenges and do not chew gum during the monitoring period.  . Lying down: Remain upright throughout the day. Do not lie down until you go to bed (unless napping or lying down during the day is part of your daily routine).  . Medications: Continue to follow your doctor's advice regarding medications to avoid during the monitoring period.  . Recording symptoms: Press the appropriate button on your recorder when symptoms occur (as discussed with the nurse).  . Recording events: Record the time you start and stop eating and drinking (anything other than plain water). Record the time you lie down (even if just resting) and when you get back up. The nurse will explain this.  . Unusual symptoms or side effects. If you think you may be experiencing any unusual symptoms or side effects, call your doctor.  You will return the next day to  have the tube removed. The information on the recorder will be downloaded to a computer and the results will be analyzed.  After completion of the study Resume your normal diet and medications. Lozenges or hard candy may help ease any sore throat caused by the tube.   Due to recent changes in healthcare laws, you may see the results of your imaging and laboratory studies on MyChart before your provider has had a chance to review them.  We understand that in some cases there may be results that are confusing or concerning to you. Not all laboratory results come back in the same time frame and the provider may be waiting for multiple results in order to interpret others.  Please give [place] 48 hours in order for your provider to thoroughly review all the results before contacting the office for clarification of your results.

## 2019-01-20 LAB — GASTRIN: Gastrin: <15 pg/mL (ref <=100)

## 2019-01-26 ENCOUNTER — Other Ambulatory Visit: Payer: Self-pay | Admitting: Internal Medicine

## 2019-01-26 ENCOUNTER — Ambulatory Visit (HOSPITAL_COMMUNITY)
Admission: RE | Admit: 2019-01-26 | Discharge: 2019-01-26 | Disposition: A | Discharge status: Home / Self Care | Payer: Medicare Other | Source: Ambulatory Visit | Attending: Internal Medicine | Admitting: Internal Medicine

## 2019-01-26 ENCOUNTER — Other Ambulatory Visit: Payer: Self-pay

## 2019-01-26 DIAGNOSIS — K2100 Gastro-esophageal reflux disease with esophagitis, without bleeding: Secondary | ICD-10-CM | POA: Diagnosis present

## 2019-01-26 DIAGNOSIS — R112 Nausea with vomiting, unspecified: Secondary | ICD-10-CM | POA: Diagnosis present

## 2019-01-31 ENCOUNTER — Telehealth: Payer: Self-pay | Admitting: Internal Medicine

## 2019-01-31 ENCOUNTER — Other Ambulatory Visit (HOSPITAL_COMMUNITY)
Admission: RE | Admit: 2019-01-31 | Discharge: 2019-01-31 | Disposition: A | Discharge status: Home / Self Care | Payer: Medicare Other | Source: Ambulatory Visit | Attending: Internal Medicine | Admitting: Internal Medicine

## 2019-01-31 DIAGNOSIS — Z01812 Encounter for preprocedural laboratory examination: Secondary | ICD-10-CM | POA: Insufficient documentation

## 2019-01-31 DIAGNOSIS — Z20822 Contact with and (suspected) exposure to covid-19: Secondary | ICD-10-CM | POA: Insufficient documentation

## 2019-01-31 LAB — SARS CORONAVIRUS 2 (TAT 6-24 HRS): SARS Coronavirus 2: NEGATIVE

## 2019-01-31 NOTE — Telephone Encounter (Signed)
Pt is scheduled for manometry tomorrow at [place]. She received a call from [facility] this morning inquiring about her Covid test. Pt stated that she was not informed that she needed one. They told her that she could have a rapid test today before 3:00pm. Pls call her.

## 2019-01-31 NOTE — Progress Notes (Signed)
preop call completed. Answered questions.  Patient going for covid test today.

## 2019-01-31 NOTE — Telephone Encounter (Signed)
Pt was not scheduled for covid test prior to EM scheduled for tomorrow. Spoke with pt and let her know if she can get to [facility] by 3pm today the EM can still be done. Pt states she does not think she can make it to [facility], states she is supposed to work today. Discussed with pt that these procedures are not being done after 1/24 and we do not know when it can be rescheduled due to covid. Pt upset, states this error is not her fault and she is not happy. Agreed with pt and apologized for her inconvenience. Patient was not sure if she can get the test done or not. Dr. Hilarie Fredrickson notified.

## 2019-01-31 NOTE — Progress Notes (Addendum)
Spoke with patient, has not gone for covid test.  Per patient was not aware she needed to have one.  Advised patient to contact Dr. Vena Rua office to possible have covid test today or reschedule.  Per Dr. Vena Rua office, patient has been instructed to go for covid test today by 3pm.  Halogen test will be back in 6 hours.  Attempted to complete preop call, left voicemessage for patient

## 2019-01-31 NOTE — Telephone Encounter (Signed)
Pt called back and thinks she can make it for the covid test today before 3pm.

## 2019-02-01 ENCOUNTER — Ambulatory Visit (HOSPITAL_COMMUNITY): Admission: RE | Admit: 2019-02-01 | Payer: Medicare Other | Source: Home / Self Care | Admitting: Internal Medicine

## 2019-02-01 ENCOUNTER — Encounter (HOSPITAL_COMMUNITY): Admission: RE | Payer: Self-pay | Source: Home / Self Care

## 2019-02-01 SURGERY — MANOMETRY, ESOPHAGUS

## 2019-02-01 NOTE — Progress Notes (Signed)
Pt called this morning and told nurse that she had lost her sense of taste and had a fever. She is canceling for today and will call [place] to reschedule. Also told to call her MD regarding her symptoms.

## 2019-02-01 NOTE — Telephone Encounter (Signed)
Pt calling states she woke up this morning with a fever of 100.6, body aches, and loss of taste.  She called [facility] and they told her to call us.

## 2019-02-01 NOTE — Telephone Encounter (Signed)
Pt went for rapid test yesterday and the result was negative. Pt was to have EM today at hospital. Please see note below and advise.

## 2019-02-01 NOTE — Telephone Encounter (Signed)
In light of the symptoms we cannot proceed with manometry I understand that COVID-19 test was negative yesterday but this needs to be repeated with fever, body aches and loss of taste. She can contact [place] to have this test performed at [facility], she will need an appointment, or through her primary care.  We need to reschedule the manometry, when allowed in the outpatient hospital setting and depending on her COVID-19 results.  I am sorry to hear she is feeling poorly

## 2019-02-01 NOTE — Telephone Encounter (Signed)
Spoke with pt and she is aware. States she is having another covid test this afternoon. She knows we will reschedule the EM when we allowed to schedule again with the hospital due to covid restrictions.

## 2019-02-17 NOTE — Telephone Encounter (Signed)
Left message for pt to call back to schedule her EM at [facility].

## 2019-02-22 ENCOUNTER — Other Ambulatory Visit: Payer: Self-pay

## 2019-02-22 DIAGNOSIS — K2100 Gastro-esophageal reflux disease with esophagitis, without bleeding: Secondary | ICD-10-CM

## 2019-02-22 NOTE — Telephone Encounter (Signed)
Pt scheduled for covid screen at [facility] 03/11/19@9 :40am. EM with 24 hour ph probe at [facility] 03/15/19@10 :30am. Pt aware of appt. Instructions mailed to pt.

## 2019-02-25 ENCOUNTER — Other Ambulatory Visit: Payer: Self-pay | Admitting: Internal Medicine

## 2019-02-28 DIAGNOSIS — F419 Anxiety disorder, unspecified: Secondary | ICD-10-CM | POA: Diagnosis present

## 2019-03-11 ENCOUNTER — Other Ambulatory Visit (HOSPITAL_COMMUNITY)
Admission: RE | Admit: 2019-03-11 | Discharge: 2019-03-11 | Disposition: A | Discharge status: Home / Self Care | Payer: Medicare Other | Source: Ambulatory Visit | Attending: Internal Medicine | Admitting: Internal Medicine

## 2019-03-11 DIAGNOSIS — Z01812 Encounter for preprocedural laboratory examination: Secondary | ICD-10-CM | POA: Insufficient documentation

## 2019-03-11 DIAGNOSIS — Z20822 Contact with and (suspected) exposure to covid-19: Secondary | ICD-10-CM | POA: Diagnosis not present

## 2019-03-11 LAB — SARS CORONAVIRUS 2 (TAT 6-24 HRS): SARS Coronavirus 2: NEGATIVE

## 2019-03-15 ENCOUNTER — Ambulatory Visit (HOSPITAL_COMMUNITY)
Admission: RE | Admit: 2019-03-15 | Discharge: 2019-03-15 | Disposition: A | Discharge status: Home / Self Care | Payer: Medicare Other | Attending: Internal Medicine | Admitting: Internal Medicine

## 2019-03-15 ENCOUNTER — Encounter (HOSPITAL_COMMUNITY): Payer: Self-pay | Admitting: Internal Medicine

## 2019-03-15 ENCOUNTER — Encounter (HOSPITAL_COMMUNITY)
Admission: RE | Disposition: A | Discharge status: Home / Self Care | Payer: Self-pay | Source: Home / Self Care | Attending: Internal Medicine

## 2019-03-15 DIAGNOSIS — K220 Achalasia of cardia: Secondary | ICD-10-CM | POA: Diagnosis not present

## 2019-03-15 DIAGNOSIS — K228 Other specified diseases of esophagus: Secondary | ICD-10-CM | POA: Insufficient documentation

## 2019-03-15 DIAGNOSIS — R1110 Vomiting, unspecified: Secondary | ICD-10-CM | POA: Diagnosis not present

## 2019-03-15 DIAGNOSIS — R112 Nausea with vomiting, unspecified: Secondary | ICD-10-CM

## 2019-03-15 DIAGNOSIS — K219 Gastro-esophageal reflux disease without esophagitis: Secondary | ICD-10-CM | POA: Diagnosis not present

## 2019-03-15 HISTORY — PX: ESOPHAGEAL MANOMETRY: SHX5429

## 2019-03-15 HISTORY — PX: 24 HOUR PH STUDY: SHX5419

## 2019-03-15 SURGERY — MANOMETRY, ESOPHAGUS

## 2019-03-15 MED ORDER — LIDOCAINE VISCOUS HCL 2 % MT SOLN
OROMUCOSAL | Status: AC
  Filled 2019-03-15: qty 15

## 2019-03-15 SURGICAL SUPPLY — 2 items
FACESHIELD LNG OPTICON STERILE (SAFETY) IMPLANT
GLOVE BIO SURGEON STRL SZ8 (GLOVE) ×6 IMPLANT

## 2019-03-15 NOTE — Progress Notes (Signed)
Esophageal manometry done per protocol.  Patient tolerated well.  24 hour pH probe with impedence then placed at 34 cm at right nare.  Patient verbalized understanding of use of all equipment.  Patient also verbalized knowledge of when to return.  Reports to be sent to Dr. Harl Bowie.

## 2019-03-16 ENCOUNTER — Encounter: Payer: Self-pay | Admitting: *Deleted

## 2019-03-20 ENCOUNTER — Telehealth: Payer: Self-pay | Admitting: Internal Medicine

## 2019-03-20 NOTE — Telephone Encounter (Signed)
Pt aware.

## 2019-03-20 NOTE — Telephone Encounter (Signed)
Results not yet available, Dr. Silverio Decamp will be reading this and I will let her know when it is available.

## 2019-03-27 ENCOUNTER — Other Ambulatory Visit: Payer: Self-pay | Admitting: Internal Medicine

## 2019-03-30 NOTE — Telephone Encounter (Signed)
Pt called back to inquire about manometry results.

## 2019-03-30 NOTE — Telephone Encounter (Signed)
Pt calling for EM results, have you seen anything from Dr. Silverio Decamp yet?

## 2019-04-04 MED ORDER — FAMOTIDINE 20 MG PO TABS: 20.0000 mg | ORAL_TABLET | Freq: Two times a day (BID) | ORAL | 6 refills | Status: DC

## 2019-04-04 NOTE — Telephone Encounter (Signed)
Pt calling to see if results of EM are back yet. Please advise.

## 2019-04-04 NOTE — Telephone Encounter (Addendum)
Patient is calling to follow up on results also states that at night she feels like she's choking.

## 2019-04-04 NOTE — Telephone Encounter (Signed)
Pt aware, script sent to pharmacy, pt scheduled to see Dr. Hilarie Fredrickson 05/09/19 at 3:40pm. Referral made to rheumatology.

## 2019-04-04 NOTE — Telephone Encounter (Signed)
Please apologize to patient for the delay in resulting pH study and esophageal manometry  Results are as follows: --There is significant acid reflux despite pantoprazole 40 mg twice daily. --Her esophageal motility is very abnormal.  There is no evidence for normal esophageal squeezing or peristalsis.  This prevents the esophagus from emptying normally.  The lower esophageal sphincter however does relax and open normally.  We can see these findings in patients with autoimmune diseases such as scleroderma and mixed connective tissue disease.    Plan: --Continue pantoprazole 40 mg twice daily AC; add famotidine 20 mg twice daily --we need to work on increase acid control given that acid reflux is still happening despite twice daily PPI --Given that her esophagus does not squeeze or empty, a Nissen fundoplication or antireflux surgery would not be possible.  If the GE junction were tightened surgically she would not be able to swallow because her esophagus does not empty out or squeeze normally --I recommend that we refer her to rheumatology, Dr. Amil Amen or Lenna Gilford to be evaluated for scleroderma and mixed connective tissue disease based on esophageal manometry findings   She is going to be disappointed but she is simply not a candidate for surgery.  We will discuss this further  Office follow-up with me as also appropriate

## 2019-04-04 NOTE — Addendum Note (Signed)
Addended by: Rosanne Sack R on: 04/04/2019 03:30 PM   Modules accepted: Orders

## 2019-04-05 NOTE — Telephone Encounter (Signed)
Pt requested a call back to discuss results of pH study.

## 2019-04-05 NOTE — Telephone Encounter (Signed)
Left message for pt to call back  °

## 2019-04-06 NOTE — Telephone Encounter (Signed)
Pt states she knows that her reflux was not controlled based on the Ph study. She wanted to know what the Ph levels were, she is having lots of issues with her teeth and wonders if this has anything to do with her dental issues. [facility] advise.

## 2019-04-07 NOTE — Telephone Encounter (Signed)
Acid reflux events occurred throughout the study, a # with pH < 5.0 She spent 55 minutes over the 24 hour period having reflux This 55 min was spread across 116 reflux episodes with the longest reflux period lasting 12 min  Uncontrolled acid reflux can affect dental enamel and led to erosion and decay  Getting reflux controlled remains important which is what my goal is with recent recommendation to add BID H2 blocker therapy

## 2019-04-07 NOTE — Telephone Encounter (Signed)
Spoke with pt and she is aware.

## 2019-04-07 NOTE — Telephone Encounter (Signed)
Please let patient know that she did have uncontrolled acid reflux and periods where her esophageal pH dropped below pH 5.0  There was 24 hours monitored and she spent 55 minutes of the study period having reflux.  There were 116 reflux episodes divided across these 55 minutes with the longest reflux.  Being 12 minutes  Acid reflux can negatively impact dental health including dental enamel and lead to dental decay  Controlling her reflux remains important which is why I recently added twice daily H2 blocker to her existing twice daily PPI

## 2019-04-24 DIAGNOSIS — K220 Achalasia of cardia: Secondary | ICD-10-CM

## 2019-04-24 DIAGNOSIS — R112 Nausea with vomiting, unspecified: Secondary | ICD-10-CM

## 2019-04-24 DIAGNOSIS — R1110 Vomiting, unspecified: Secondary | ICD-10-CM

## 2019-04-27 ENCOUNTER — Other Ambulatory Visit: Payer: Self-pay | Admitting: Internal Medicine

## 2019-05-09 ENCOUNTER — Encounter: Payer: Self-pay | Admitting: Internal Medicine

## 2019-05-09 ENCOUNTER — Ambulatory Visit (INDEPENDENT_AMBULATORY_CARE_PROVIDER_SITE_OTHER): Payer: Medicare Other | Admitting: Internal Medicine

## 2019-05-09 ENCOUNTER — Other Ambulatory Visit: Payer: Self-pay

## 2019-05-09 VITALS — BP 100/58 | HR 78 | Temp 99.2°F | Ht 59.5 in | Wt 83.0 lb

## 2019-05-09 DIAGNOSIS — E43 Unspecified severe protein-calorie malnutrition: Secondary | ICD-10-CM

## 2019-05-09 DIAGNOSIS — K220 Achalasia of cardia: Secondary | ICD-10-CM

## 2019-05-09 DIAGNOSIS — K2100 Gastro-esophageal reflux disease with esophagitis, without bleeding: Secondary | ICD-10-CM

## 2019-05-09 DIAGNOSIS — K5909 Other constipation: Secondary | ICD-10-CM | POA: Diagnosis not present

## 2019-05-09 MED ORDER — FAMOTIDINE 20 MG PO TABS: 20.0000 mg | ORAL_TABLET | Freq: Two times a day (BID) | ORAL | 2 refills | Status: DC

## 2019-05-09 MED ORDER — PANTOPRAZOLE SODIUM 40 MG PO TBEC: 40.0000 mg | DELAYED_RELEASE_TABLET | Freq: Three times a day (TID) | ORAL | 1 refills | Status: DC

## 2019-05-09 NOTE — Progress Notes (Signed)
Subjective:    Patient ID: [name], female    DOB: 06/10/1970, 49 y.o.   MRN: [id number]  HPI [name] is a 49 year old female with a history of GERD with severe esophagitis, history of peptic ulcer disease causing gastric outlet obstruction and status post partial gastrectomy with bilateral vagotomy in June 2018, malnutrition chronic constipation, history of interstitial cystitis, hypothyroidism, migraines, anxiety and depression who is here for follow-up.  She was last seen on 01/18/2019.  She is here alone today.  At her last visit due to ongoing severe GERD, regurgitation heartburn we evaluated further for possibility of Nissen fundoplication at [facility].  Prior to this referral we put her through a 24-hour pH study and high-resolution esophageal manometry.  The studies were performed on twice daily pantoprazole.  Dr. Silverio Decamp helped with the studies and we have discussed the case together.  Her high-resolution esophageal manometry showed normal relaxation of the EG junction but absent peristalsis with poor bolus clearance.  Dr. Silverio Decamp recommended excluding scleroderma or mixed connective tissue disorder.  Her 24-hour pH study revealed an elevated DeMeester score despite twice daily PPI.  [name] has continued to struggle with swallowing and regurgitation after eating.  She has heartburn and very difficult time eating without later regurgitating which she calls vomiting.  She is using pantoprazole twice daily recently famotidine 20 mg twice daily and Reglan 2-3 times a day.  She has been unable to gain weight and lost 5 pounds since January.  She has constipation unless she uses the Amitiza which she does about 2 or 3 days/week.  If she uses it daily she has urgent loose stools at times.  She has felt down and depressed and is worried about her nutrition.  She is tired and fatigued frequently.  Previously her gastrin level was normal.   Review of Systems As per HPI, otherwise  negative  Current Medications, Allergies, Past Medical History, Past Surgical History, Family History and Social History were reviewed in [employer] record.     Objective:   Physical Exam BP (!) 100/58   Pulse 78   Temp 99.2 F (37.3 C)   Ht 4' 11.5" (1.511 m)   Wt 83 lb (37.6 kg)   BMI 16.48 kg/m  Gen: awake, alert, NAD HEENT: anicteric Ext: no c/c/e Neuro: nonfocal     Assessment & Plan:  49 year old female with a history of GERD with severe esophagitis, history of peptic ulcer disease causing gastric outlet obstruction and status post partial gastrectomy with bilateral vagotomy in June 2018, malnutrition chronic constipation, history of interstitial cystitis, hypothyroidism, migraines, anxiety and depression who is here for follow-up.  1.  GERD with severe esophagitis/esophageal aperistalsis --we discussed the findings of her pH and impedance plus manometry testing.  She is certainly not a candidate for Nissen fundoplication given her lack of esophageal peristalsis.  I explained that any fundoplication would lead to achalasia and inability to swallow.  Her acid is still not controlled despite twice daily PPI though added twice daily famotidine she still has heartburn.  I think this is likely esophageal stasis as much as elevated acid secretion.  Her gastrin level was normal.  She does not have gastric outlet obstruction.  She has been referred to Dr. Amil Amen with rheumatology who she will see on 05/26/2019.  I think it is important to rule out scleroderma and mixed connective tissue disease.  If these are negative though rarely seen, she could have simply burnt out esophageal motility  in the setting of severe reflux esophagitis.  Either way I am worried about her nutrition, see #2 --Increase pantoprazole to 40 mg 3 times daily AC --Continue famotidine 20 mg twice daily --Discontinue metoclopramide given the risk for long-term toxicity but also lack of efficacy for  her --I discussed that she needs to remain upright after eating and drinking for as long as possible to promote spontaneous/gravity dependent esophageal clearance --Rheumatology referral coming up on 05/26/2019  2.  Malnutrition --her weight is down further and her BMI is low at 16.  This is certainly exacerbated and predominantly driven by #1.  This is a very tough position for her and I think that she may benefit from a jejunal feeding tube and nocturnal jejunal feedings.  I explained how if a feeding tube were placed in the jejunum she could slowly work towards a caloric goal so that she could actually gain weight and maintain her nutritional status.  She is open to this but we will first have her complete rheumatology evaluation as above.  3.  Chronic constipation --Amitiza twice daily, hopefully this will improve if we are able to improve her nutrition even if it is with tube feeds  June follow-up with me  30 minutes total spent today including patient facing time, coordination of care, reviewing medical history/procedures/pertinent radiology studies, and documentation of the encounter.

## 2019-05-09 NOTE — Patient Instructions (Signed)
Discontinue Reglan.  Increase your pantoprazole to 40 mg three times daily (we are sending a new prescription to your pharmacy for this)  Famotidine twice daily   Please keep your appointment with Dr Amil Amen.  If you are age 47 or older, your body mass index should be between 23-30. Your Body mass index is 16.48 kg/m. If this is out of the aforementioned range listed, please consider follow up with your Primary Care Provider.  If you are age 70 or younger, your body mass index should be between 19-25. Your Body mass index is 16.48 kg/m. If this is out of the aformentioned range listed, please consider follow up with your Primary Care Provider.   Due to recent changes in healthcare laws, you may see the results of your imaging and laboratory studies on MyChart before your provider has had a chance to review them.  We understand that in some cases there may be results that are confusing or concerning to you. Not all laboratory results come back in the same time frame and the provider may be waiting for multiple results in order to interpret others.  Please give [place] 48 hours in order for your provider to thoroughly review all the results before contacting the office for clarification of your results.

## 2019-05-10 ENCOUNTER — Telehealth: Payer: Self-pay | Admitting: Internal Medicine

## 2019-05-10 NOTE — Telephone Encounter (Signed)
Pt called to let you know she has decided that she wants to have the feeding tube. Per OV note Rheumatology eval needs to be completed first. Dr. Hilarie Fredrickson aware.

## 2019-05-10 NOTE — Telephone Encounter (Signed)
Pt stated that she is ready to make the decision to have feeding tube.

## 2019-05-15 ENCOUNTER — Telehealth: Payer: Self-pay | Admitting: Internal Medicine

## 2019-05-15 DIAGNOSIS — R112 Nausea with vomiting, unspecified: Secondary | ICD-10-CM

## 2019-05-15 DIAGNOSIS — K2100 Gastro-esophageal reflux disease with esophagitis, without bleeding: Secondary | ICD-10-CM

## 2019-05-15 NOTE — Telephone Encounter (Signed)
Spoke with pt and she is going to try to hydrate and start the reglan. States she cannot come today for the labs and xray will be tomorrow. Orders in epic.

## 2019-05-15 NOTE — Telephone Encounter (Signed)
Pt reports for the past 2-3 days she has been having more problems eating and drinking, states she is so nauseated she cannot keep food down and having trouble keeping liquids down. Thinks she may have aspirated, states it is hard to breathe. She also reports she is so weak she has the shakes. She states she does not have a fever. Please advise.

## 2019-05-15 NOTE — Telephone Encounter (Signed)
Will need ER if unable to keep down any PO If she thinks she may be able to hydrate, then resume metoclopramide (stopped recently at office visit), 10 mg TID-AC and HS -- this should help nausea and gastric emptying.   PA/lat CXR CBC, CMP

## 2019-05-16 ENCOUNTER — Other Ambulatory Visit (INDEPENDENT_AMBULATORY_CARE_PROVIDER_SITE_OTHER): Payer: Medicare Other

## 2019-05-16 ENCOUNTER — Ambulatory Visit (INDEPENDENT_AMBULATORY_CARE_PROVIDER_SITE_OTHER)
Admission: RE | Admit: 2019-05-16 | Discharge: 2019-05-16 | Disposition: A | Discharge status: Home / Self Care | Payer: Medicare Other | Source: Ambulatory Visit | Attending: Internal Medicine | Admitting: Internal Medicine

## 2019-05-16 ENCOUNTER — Other Ambulatory Visit: Payer: Self-pay

## 2019-05-16 DIAGNOSIS — K2100 Gastro-esophageal reflux disease with esophagitis, without bleeding: Secondary | ICD-10-CM

## 2019-05-16 DIAGNOSIS — R112 Nausea with vomiting, unspecified: Secondary | ICD-10-CM

## 2019-05-16 LAB — COMPREHENSIVE METABOLIC PANEL
ALT: 6 U/L (ref 0–35)
AST: 12 U/L (ref 0–37)
Albumin: 3.4 g/dL — ABNORMAL LOW (ref 3.5–5.2)
Alkaline Phosphatase: 74 U/L (ref 39–117)
BUN: 11 mg/dL (ref 6–23)
CO2: 28 mEq/L (ref 19–32)
Calcium: 8.5 mg/dL (ref 8.4–10.5)
Chloride: 108 mEq/L (ref 96–112)
Creatinine, Ser: 0.71 mg/dL (ref 0.40–1.20)
GFR: 88.15 mL/min (ref >60.00)
Glucose, Bld: 97 mg/dL (ref 70–99)
Potassium: 3.6 mEq/L (ref 3.5–5.1)
Sodium: 141 mEq/L (ref 135–145)
Total Bilirubin: 0.2 mg/dL (ref 0.2–1.2)
Total Protein: 5.7 g/dL — ABNORMAL LOW (ref 6.0–8.3)

## 2019-05-16 LAB — CBC WITH DIFFERENTIAL/PLATELET
Basophils Absolute: 0 10*3/uL (ref 0.0–0.1)
Basophils Relative: 0.3 % (ref 0.0–3.0)
Eosinophils Absolute: 0.2 10*3/uL (ref 0.0–0.7)
Eosinophils Relative: 2.2 % (ref 0.0–5.0)
HCT: 36.2 % (ref 36.0–46.0)
Hemoglobin: 11.8 g/dL — ABNORMAL LOW (ref 12.0–15.0)
Lymphocytes Relative: 28.2 % (ref 12.0–46.0)
Lymphs Abs: 2.3 10*3/uL (ref 0.7–4.0)
MCHC: 32.5 g/dL (ref 30.0–36.0)
MCV: 93.2 fl (ref 78.0–100.0)
Monocytes Absolute: 0.8 10*3/uL (ref 0.1–1.0)
Monocytes Relative: 9.9 % (ref 3.0–12.0)
Neutro Abs: 4.8 10*3/uL (ref 1.4–7.7)
Neutrophils Relative %: 59.4 % (ref 43.0–77.0)
Platelets: 227 10*3/uL (ref 150.0–400.0)
RBC: 3.89 Mil/uL (ref 3.87–5.11)
RDW: 13.7 % (ref 11.5–15.5)
WBC: 8.1 10*3/uL (ref 4.0–10.5)

## 2019-05-17 ENCOUNTER — Other Ambulatory Visit: Payer: Self-pay

## 2019-05-17 ENCOUNTER — Telehealth: Payer: Self-pay | Admitting: Internal Medicine

## 2019-05-17 DIAGNOSIS — R9389 Abnormal findings on diagnostic imaging of other specified body structures: Secondary | ICD-10-CM

## 2019-05-17 DIAGNOSIS — R0600 Dyspnea, unspecified: Secondary | ICD-10-CM

## 2019-05-17 NOTE — Telephone Encounter (Signed)
See result note.  

## 2019-05-17 NOTE — Telephone Encounter (Signed)
Pt calling for xray result from yesterday. Please advise.

## 2019-05-18 ENCOUNTER — Telehealth: Payer: Self-pay | Admitting: Internal Medicine

## 2019-05-18 NOTE — Telephone Encounter (Signed)
See result note.  

## 2019-05-22 ENCOUNTER — Telehealth: Payer: Self-pay | Admitting: Internal Medicine

## 2019-05-22 NOTE — Telephone Encounter (Signed)
Let pt know that the referral has been placed for pulmonary referral on 5/5 and they should contact her with an appt.

## 2019-05-24 ENCOUNTER — Other Ambulatory Visit: Payer: Self-pay

## 2019-05-24 ENCOUNTER — Ambulatory Visit (HOSPITAL_COMMUNITY)
Admission: RE | Admit: 2019-05-24 | Discharge: 2019-05-24 | Disposition: A | Discharge status: Home / Self Care | Payer: Medicare Other | Source: Ambulatory Visit | Attending: Internal Medicine | Admitting: Internal Medicine

## 2019-05-24 DIAGNOSIS — R0600 Dyspnea, unspecified: Secondary | ICD-10-CM

## 2019-05-24 DIAGNOSIS — R9389 Abnormal findings on diagnostic imaging of other specified body structures: Secondary | ICD-10-CM

## 2019-05-26 ENCOUNTER — Telehealth: Payer: Self-pay | Admitting: Internal Medicine

## 2019-05-26 ENCOUNTER — Encounter: Payer: Self-pay | Admitting: Internal Medicine

## 2019-05-26 NOTE — Telephone Encounter (Signed)
Pt calling for CT result.

## 2019-05-29 NOTE — Telephone Encounter (Signed)
See result note.  

## 2019-06-22 ENCOUNTER — Ambulatory Visit: Payer: Medicare Other | Admitting: Critical Care Medicine

## 2019-06-22 ENCOUNTER — Other Ambulatory Visit: Payer: Self-pay

## 2019-06-22 ENCOUNTER — Encounter: Payer: Self-pay | Admitting: Critical Care Medicine

## 2019-06-22 VITALS — BP 108/70 | HR 70 | Temp 98.3°F | Ht 58.86 in | Wt 87.0 lb

## 2019-06-22 DIAGNOSIS — R9389 Abnormal findings on diagnostic imaging of other specified body structures: Secondary | ICD-10-CM

## 2019-06-22 DIAGNOSIS — Z720 Tobacco use: Secondary | ICD-10-CM

## 2019-06-22 DIAGNOSIS — K2100 Gastro-esophageal reflux disease with esophagitis, without bleeding: Secondary | ICD-10-CM

## 2019-06-22 NOTE — Patient Instructions (Addendum)
Thank you for visiting Dr. Carlis Abbott at [facility]. We recommend the following: Orders Placed This Encounter  Procedures  . CT Chest High Resolution  . Pulmonary function test   Orders Placed This Encounter  Procedures  . CT Chest High Resolution    First available    Standing Status:   Future    Standing Expiration Date:   06/21/2020    Order Specific Question:   ** REASON FOR EXAM (FREE TEXT)    Answer:   concern for ILD; chronic GERD, concern for CTD    Order Specific Question:   Is patient pregnant?    Answer:   Unknown (Please Explain)    Order Specific Question:   Preferred imaging location?    Answer:   [facility]    Order Specific Question:   Radiology Contrast Protocol - do NOT remove file path    Answer:   [email or web address]  . Pulmonary function test    Standing Status:   Future    Standing Expiration Date:   06/21/2020    Order Specific Question:   Where should this test be performed?    Answer:   [facility]    Order Specific Question:   Full PFT: includes the following: basic spirometry, spirometry pre & post bronchodilator, diffusion capacity (DLCO), lung volumes    Answer:   Full PFT      Return in about 2 weeks (around 07/06/2019). after CT scan.    Please do your part to reduce the spread of COVID-19.  It is very important that you stop smoking or vaping. This is the single most important thing that you can do to improve your lung health.   S = Set a quit date. T = Tell family, friends, and the people around you that you plan to quit. A = Anticipate or plan ahead for the tough times you'll face while quitting. R = Remove cigarettes and other tobacco products from your home, car, and work. T = Talk to [place] about getting help to quit.  If you need help, please reach out to our office or the smoking cessation resources available: [place] Smoking Cessation Class:  [phone] 1-800-QUIT-NOW www.BeTobaccoFree.gov

## 2019-06-22 NOTE — Progress Notes (Signed)
Synopsis: Referred in June 2021 for chest x-ray by Pyrtle, Lajuan Lines, MD.  Subjective:   PATIENT ID: [name] GENDER: female DOB: 1970/03/25, MRN: [id number]  Chief Complaint  Patient presents with   Consult    Patient is supposed to be getting a feeding tube. Patient had abnormal CT and has shortness of breath with exertion. Dry cough    [name] is a 49 year old woman with a history of severe esophagitis, peptic ulcer disease with gastric outlet obstruction, status post partial gastrectomy, bilateral vagotomy, esophageal aperistalsis who presents for evaluation of an abnormal chest CT scan.  She has followed with GI for the last 5 to 6 years since she began having frequent vomiting and upper GI issues.  Over that time she has lost about 50 pounds from her previous baseline weight of 135 pounds.  She is recommended to get a jejunal feeding tube to address malnutrition given her refractory GI issues.  She has frequent acid reflux despite PPI 3 times a day and Pepcid.  She has been on promotility agents.  She vomits whenever she eats during the day, so she did avoids eating during the day and eats right before she lays down at night.  About 2 nights per week she wakes up coughing up acid and choking on vomit.  At baseline she has no shortness of breath, sputum production, wheezing.  She has no known rheumatologic history.  She was previously seen by Dr. Amil Amen due to concern for scleroderma, who determined that she does not have a rheumatologic condition.  Serologies were performed during her work-up.  She had a chest x-ray performed in early May by GI after an episode of frequent vomiting.  Due to abnormalities she had a subsequent CT scan a week later.  There is a family history of MI in her mother but no family history of rheumatologic disease.  She is a current every day smoker, 0.5- 1 packs/day x 30 years.     Past Medical History:  Diagnosis Date   Allergic rhinitis    Anemia     Anxiety disorder    Calcaneus fracture, right 06/19/2017   Chronic interstitial cystitis    Chronic pain 06/19/2017   Depression    Gastric outlet obstruction    Gastric ulcer    GERD (gastroesophageal reflux disease)    Hiatal hernia    Hypothyroidism    in the past   IBS (irritable bowel syndrome)    IDA (iron deficiency anemia)    Internal hemorrhoids    Kidney stones    Migraine    Myalgia    OCD (obsessive compulsive disorder)    Ulcerative esophagitis    Vitamin D deficiency      Family History  Problem Relation Age of Onset   Clotting disorder Mother    Crohn's disease Mother    Heart disease Mother    Heart disease Father    Breast cancer Maternal Aunt    Colon cancer Neg Hx    Stomach cancer Neg Hx    Rectal cancer Neg Hx    Esophageal cancer Neg Hx      Past Surgical History:  Procedure Laterality Date   65 HOUR [place] STUDY N/A 03/15/2019   Procedure: 24 HOUR [place] STUDY;  Surgeon: Jerene Bears, MD;  Location: [facility];  Service: Gastroenterology;  Laterality: N/A;   ABDOMINAL HYSTERECTOMY     BILROTH I PROCEDURE  06/22/2016   Dr Pauletta Browns---- due to gastric ulcer  ESOPHAGEAL MANOMETRY N/A 03/15/2019   Procedure: ESOPHAGEAL MANOMETRY (EM);  Surgeon: Jerene Bears, MD;  Location: [facility];  Service: Gastroenterology;  Laterality: N/A;   EXTERNAL EAR SURGERY     dont know specific ear sugery (inner/external)   FRACTURE SURGERY     right arm   mutliple surgeries due to intertitial cystitis     bladder stimulator   ORIF TIBIA FRACTURE Right 06/17/2017   Procedure: OPEN REDUCTION INTERNAL FIXATION (ORIF) DISTAL TIBIA FRACTURE AND CALCANEOUS;  Surgeon: Altamese , MD;  Location: [facility];  Service: Orthopedics;  Laterality: Right;   OTHER SURGICAL HISTORY     TENS implantation for Chronic Interstitial Cystitis    Social History   Socioeconomic History   Marital status: Legally Separated    Spouse name: Not on file    Number of children: Not on file   Years of education: Not on file   Highest education level: Not on file  Occupational History   Occupation: Tourist information centre manager: [employer]  Tobacco Use   Smoking status: Current Every Day Smoker    Packs/day: 0.50    Types: Cigarettes   Smokeless tobacco: Never Used  Vaping Use   Vaping Use: Never used  Substance and Sexual Activity   Alcohol use: Not Currently   Drug use: Never   Sexual activity: Not on file  Other Topics Concern   Not on file  Social History Narrative   Not on file   Social Determinants of Health   Financial Resource Strain:    Difficulty of Paying Living Expenses:   Food Insecurity:    Worried About Charity fundraiser in the Last Year:    Arboriculturist in the Last Year:   Transportation Needs:    Film/video editor (Medical):    Lack of Transportation (Non-Medical):   Physical Activity:    Days of Exercise per Week:    Minutes of Exercise per Session:   Stress:    Feeling of Stress :   Social Connections:    Frequency of Communication with Friends and Family:    Frequency of Social Gatherings with Friends and Family:    Attends Religious Services:    Active Member of Clubs or Organizations:    Attends Archivist Meetings:    Marital Status:   Intimate Partner Violence:    Fear of Current or Ex-Partner:    Emotionally Abused:    Physically Abused:    Sexually Abused:      Allergies  Allergen Reactions   Sulfamethoxazole Anaphylaxis   Morphine And Related Other (See Comments)    Delirium. Makes me go "crazy"   Xanax Xr [Alprazolam Er] Other (See Comments)    Enhanced depression; feels gloomy      There is no immunization history on file for this patient.  Outpatient Medications Prior to Visit  Medication Sig Dispense Refill   acetaminophen (TYLENOL) 325 MG tablet Take 1-2 tablets (325-650 mg total) by mouth every 6 (six) hours as needed for mild pain  (pain score 1-3 or temp > 100.5).     Aspirin-Salicylamide-Caffeine (BC HEADACHE PO) Take 1 packet by mouth daily as needed.      docusate sodium (COLACE) 100 MG capsule Take 1 capsule (100 mg total) by mouth 2 (two) times daily. 10 capsule 0   famotidine (PEPCID) 20 MG tablet Take 1 tablet (20 mg total) by mouth 2 (two) times daily. 180 tablet 2   gabapentin (  NEURONTIN) 300 MG capsule Take 300 mg by mouth 3 (three) times daily.      hydrOXYzine (ATARAX/VISTARIL) 25 MG tablet Take 25 mg by mouth at bedtime as needed for sleep.     lubiprostone (AMITIZA) 24 MCG capsule Take 1 capsule (24 mcg total) by mouth 2 (two) times daily with a meal. Pharmacy-please d/c rx for 8 mcg dosing 60 capsule 3   pantoprazole (PROTONIX) 40 MG tablet Take 1 tablet (40 mg total) by mouth in the morning, at noon, and at bedtime. 270 tablet 1   promethazine (PHENERGAN) 25 MG tablet Take 25 mg by mouth 3 (three) times daily as needed for nausea/vomiting.  1   QUEtiapine (SEROQUEL) 50 MG tablet Take 50 mg by mouth at bedtime.  6   sertraline (ZOLOFT) 50 MG tablet Take 50 mg by mouth daily.     sucralfate (CARAFATE) 1 GM/10ML suspension Take 10 mLs (1 g total) by mouth 4 (four) times daily. 3 times daily and at bedtime. 1200 mL 3   traMADol (ULTRAM) 50 MG tablet 1-2 po q6 hr prn pain     traZODone (DESYREL) 50 MG tablet Take 50 mg by mouth at bedtime.     vitamin C (VITAMIN C) 500 MG tablet Take 1 tablet (500 mg total) by mouth daily. 30 tablet 1   No facility-administered medications prior to visit.    Review of Systems  Constitutional: Positive for malaise/fatigue and weight loss. Negative for chills and fever.  Respiratory: Positive for cough. Negative for sputum production, shortness of breath and wheezing.   Cardiovascular: Negative for leg swelling.  Gastrointestinal: Positive for constipation, heartburn, nausea and vomiting.  Neurological: Negative for focal weakness.     Objective:   Vitals:     06/22/19 1012  BP: 108/70  Pulse: 70  Temp: 98.3 F (36.8 C)  TempSrc: Oral  SpO2: 98%  Weight: 87 lb (39.5 kg)  Height: 4' 10.86" (1.495 m)   98% on   RA BMI Readings from Last 3 Encounters:  06/22/19 17.66 kg/m  05/09/19 16.48 kg/m  01/18/19 17.65 kg/m   Wt Readings from Last 3 Encounters:  06/22/19 87 lb (39.5 kg)  05/09/19 83 lb (37.6 kg)  01/18/19 87 lb 6.4 oz (39.6 kg)    Physical Exam Vitals reviewed.  Constitutional:      Comments: Frail, chronically ill-appearing woman  HENT:     Head: Normocephalic and atraumatic.  Eyes:     General: No scleral icterus. Cardiovascular:     Rate and Rhythm: Normal rate and regular rhythm.     Heart sounds: No murmur heard.   Pulmonary:     Comments: Breathing comfortably on room air, no conversational dyspnea.  Faint right > left basilar rales, otherwise CTA Abdominal:     General: There is no distension.     Palpations: Abdomen is soft.     Tenderness: There is no abdominal tenderness.  Musculoskeletal:        General: No swelling or deformity.     Cervical back: Neck supple.  Lymphadenopathy:     Cervical: No cervical adenopathy.  Skin:    General: Skin is warm and dry.     Findings: No rash.  Neurological:     General: No focal deficit present.     Mental Status: She is alert.     Coordination: Coordination normal.  Psychiatric:        Mood and Affect: Mood normal.        Behavior:  Behavior normal.      CBC    Component Value Date/Time   WBC 8.1 05/16/2019 1109   RBC 3.89 05/16/2019 1109   HGB 11.8 (L) 05/16/2019 1109   HGB 7.4 (L) 09/05/2015 1350   HCT 36.2 05/16/2019 1109   HCT 27.1 (L) 09/05/2015 1350   PLT 227.0 05/16/2019 1109   PLT 241 09/05/2015 1350   MCV 93.2 05/16/2019 1109   MCV 71.9 (L) 09/05/2015 1350   MCH 30.3 06/19/2017 0417   MCHC 32.5 05/16/2019 1109   RDW 13.7 05/16/2019 1109   RDW 20.3 (H) 09/05/2015 1350   LYMPHSABS 2.3 05/16/2019 1109   LYMPHSABS 3.0 09/05/2015 1350    MONOABS 0.8 05/16/2019 1109   MONOABS 0.9 09/05/2015 1350   EOSABS 0.2 05/16/2019 1109   EOSABS 0.2 09/05/2015 1350   BASOSABS 0.0 05/16/2019 1109   BASOSABS 0.0 09/05/2015 1350    CHEMISTRY No results for input(s): NA, K, CL, CO2, GLUCOSE, BUN, CREATININE, CALCIUM, MG, PHOS in the last 168 hours. CrCl cannot be calculated (Patient's most recent lab result is older than the maximum 21 days allowed.).   Chest Imaging- films reviewed: CT chest 05/24/2019-paraseptal emphysema in the anterior upper lobes.  Centrilobular emphysema large groundglass opacities, most notable in the right upper lobe.  More subtle groundglass opacities in the lingula, lateral right middle lobe, bilateral lower lobes.  Evidence of fibrosis in bilateral bases, possibly subpleural cysts in the right base.  5 mm right middle lobe nodule.  Scattered borderline enlarged mediastinal lymph nodes.  No hilar adenopathy.  Dilated esophagus with air-fluid level in the lower thorax. Compared to lower lung zones in 2017, new scarring and groundglass  Pulmonary Functions Testing Results: No flowsheet data found.      Assessment & Plan:     ICD-10-CM   1. Tobacco abuse  Z72.0   2. Abnormal CT of the chest  R93.89   3. Gastroesophageal reflux disease with esophagitis without hemorrhage  K21.00     Abnormal chest CT Esophagitis, GERD, esophageal aperistalsis Agree with concern for scleroderma, but previous rheumatology evaluation has suggested against this.  No signs of subcutaneous sclerosis on physical exam.  At risk for smoking-related ILD and aspiration pneumonitis. -Requesting records from Dr. Melissa Noon office -Follow-up HRCT chest to better evaluate abnormalities and determine if these are migratory, more suggestive of aspiration pneumonitis versus fixed defects, more suggestive of possible CTD or smoking associated lung disease. -PFTs  Tobacco abuse -Strongly recommended cessation.  She recognizes that motivation  is her primary limitation.  Agree with her plan for nicotine replacement therapy as a quitting tool. -Recommend lung cancer screening when she is appropriate age -PFTs  RTC in 2 weeks after repeat chest CT.   Current Outpatient Medications:    acetaminophen (TYLENOL) 325 MG tablet, Take 1-2 tablets (325-650 mg total) by mouth every 6 (six) hours as needed for mild pain (pain score 1-3 or temp > 100.5)., Disp: , Rfl:    Aspirin-Salicylamide-Caffeine (BC HEADACHE PO), Take 1 packet by mouth daily as needed. , Disp: , Rfl:    docusate sodium (COLACE) 100 MG capsule, Take 1 capsule (100 mg total) by mouth 2 (two) times daily., Disp: 10 capsule, Rfl: 0   famotidine (PEPCID) 20 MG tablet, Take 1 tablet (20 mg total) by mouth 2 (two) times daily., Disp: 180 tablet, Rfl: 2   gabapentin (NEURONTIN) 300 MG capsule, Take 300 mg by mouth 3 (three) times daily. , Disp: , Rfl:    hydrOXYzine (  ATARAX/VISTARIL) 25 MG tablet, Take 25 mg by mouth at bedtime as needed for sleep., Disp: , Rfl:    lubiprostone (AMITIZA) 24 MCG capsule, Take 1 capsule (24 mcg total) by mouth 2 (two) times daily with a meal. Pharmacy-please d/c rx for 8 mcg dosing, Disp: 60 capsule, Rfl: 3   pantoprazole (PROTONIX) 40 MG tablet, Take 1 tablet (40 mg total) by mouth in the morning, at noon, and at bedtime., Disp: 270 tablet, Rfl: 1   promethazine (PHENERGAN) 25 MG tablet, Take 25 mg by mouth 3 (three) times daily as needed for nausea/vomiting., Disp: , Rfl: 1   QUEtiapine (SEROQUEL) 50 MG tablet, Take 50 mg by mouth at bedtime., Disp: , Rfl: 6   sertraline (ZOLOFT) 50 MG tablet, Take 50 mg by mouth daily., Disp: , Rfl:    sucralfate (CARAFATE) 1 GM/10ML suspension, Take 10 mLs (1 g total) by mouth 4 (four) times daily. 3 times daily and at bedtime., Disp: 1200 mL, Rfl: 3   traMADol (ULTRAM) 50 MG tablet, 1-2 po q6 hr prn pain, Disp: , Rfl:    traZODone (DESYREL) 50 MG tablet, Take 50 mg by mouth at bedtime., Disp: , Rfl:     vitamin C (VITAMIN C) 500 MG tablet, Take 1 tablet (500 mg total) by mouth daily., Disp: 30 tablet, Rfl: 1   Julian Hy, DO [facility] 06/22/2019 10:16 AM

## 2019-06-26 ENCOUNTER — Telehealth: Payer: Self-pay | Admitting: Internal Medicine

## 2019-06-26 NOTE — Telephone Encounter (Signed)
I would recommend that we refer her to IR for placement of a jejunal feeding tube She will then need to see nutrition for advice regarding tube feeding formulation to support her nutrition She will likely be able to drink liquids for comfort but majority of her nutrition should be via tube given her profound esophageal dysmotility and esophageal stasis Office followup with me thereafter

## 2019-06-26 NOTE — Telephone Encounter (Signed)
Pt states she was cleared by rheumatology and she saw pulmonary and has to have another CT scan. States she woke up last night choking and reports she has trouble breathing. She is afraid she will choke during the night and that her esophagus is going to completely close. Pt states she is still waiting on feeding tube. Please advise.

## 2019-06-27 ENCOUNTER — Other Ambulatory Visit: Payer: Self-pay

## 2019-06-27 DIAGNOSIS — R1310 Dysphagia, unspecified: Secondary | ICD-10-CM

## 2019-06-27 DIAGNOSIS — K224 Dyskinesia of esophagus: Secondary | ICD-10-CM

## 2019-06-27 DIAGNOSIS — R0600 Dyspnea, unspecified: Secondary | ICD-10-CM

## 2019-06-27 NOTE — Telephone Encounter (Signed)
Spoke with IR and they place GE tubes. They will replace Jejunal tubes or exchange them but do not place the initial one. Please advise.

## 2019-06-27 NOTE — Telephone Encounter (Signed)
Left message for pt to call back. Referral faxed to [facility].  Spoke with pt, she knows to expect a call from [place] regarding appt.

## 2019-06-27 NOTE — Telephone Encounter (Signed)
She will need to be referred to [facility] for consideration of j-tube placement

## 2019-07-05 ENCOUNTER — Ambulatory Visit (HOSPITAL_COMMUNITY): Payer: Medicare Other

## 2019-07-07 ENCOUNTER — Other Ambulatory Visit: Payer: Self-pay

## 2019-07-07 ENCOUNTER — Encounter (HOSPITAL_COMMUNITY): Payer: Self-pay

## 2019-07-07 ENCOUNTER — Ambulatory Visit (HOSPITAL_COMMUNITY)
Admission: RE | Admit: 2019-07-07 | Discharge: 2019-07-07 | Disposition: A | Discharge status: Home / Self Care | Payer: Medicare Other | Source: Ambulatory Visit | Attending: Critical Care Medicine | Admitting: Critical Care Medicine

## 2019-07-07 DIAGNOSIS — R9389 Abnormal findings on diagnostic imaging of other specified body structures: Secondary | ICD-10-CM | POA: Diagnosis present

## 2019-07-07 DIAGNOSIS — K2100 Gastro-esophageal reflux disease with esophagitis, without bleeding: Secondary | ICD-10-CM | POA: Diagnosis present

## 2019-07-07 DIAGNOSIS — Z720 Tobacco use: Secondary | ICD-10-CM | POA: Insufficient documentation

## 2019-07-10 ENCOUNTER — Other Ambulatory Visit: Payer: Self-pay | Admitting: Internal Medicine

## 2019-07-10 ENCOUNTER — Telehealth: Payer: Self-pay | Admitting: Internal Medicine

## 2019-07-10 NOTE — Telephone Encounter (Signed)
Called pt and discussed with her that the referral was faxed to [place] on 06/26/19. Left message for [facility] to call back regarding appt. Let pt know the office is checking on the appt.

## 2019-07-11 ENCOUNTER — Encounter: Payer: Self-pay | Admitting: Critical Care Medicine

## 2019-07-11 ENCOUNTER — Ambulatory Visit: Payer: Medicare Other | Admitting: Critical Care Medicine

## 2019-07-11 ENCOUNTER — Other Ambulatory Visit: Payer: Self-pay

## 2019-07-11 VITALS — BP 122/70 | HR 83 | Temp 98.2°F | Ht 59.0 in | Wt 84.8 lb

## 2019-07-11 DIAGNOSIS — Z720 Tobacco use: Secondary | ICD-10-CM

## 2019-07-11 DIAGNOSIS — J439 Emphysema, unspecified: Secondary | ICD-10-CM

## 2019-07-11 DIAGNOSIS — J849 Interstitial pulmonary disease, unspecified: Secondary | ICD-10-CM | POA: Diagnosis not present

## 2019-07-11 DIAGNOSIS — T17908S Unspecified foreign body in respiratory tract, part unspecified causing other injury, sequela: Secondary | ICD-10-CM | POA: Diagnosis not present

## 2019-07-11 DIAGNOSIS — K2100 Gastro-esophageal reflux disease with esophagitis, without bleeding: Secondary | ICD-10-CM

## 2019-07-11 NOTE — Telephone Encounter (Signed)
Noted  

## 2019-07-11 NOTE — Progress Notes (Signed)
Synopsis: Referred in June 2021 for chest x-ray by Cyndi Bender, PA-C.  Subjective:   PATIENT ID: [name] GENDER: female DOB: May 14, 1970, MRN: [id number]  Chief Complaint  Patient presents with  . Follow-up    Patient is about the same from last visit. She states she had a spell of coughing a a fever and went to PCP and got a rescue inhaler and steriod injection and she has been better since.     [name] is a 49 year old woman with a history of recurrent aspirations and vomiting episodes due to severe esophagitis and severe esophageal aperistalsis,  PUD with previous gastric outlet obstruction, s/p gastrectomy and bilateral vagotomy.  She presents for follow-up after obtaining repeat HRCT chest.  She has been referred for J-tube placement after having a repeat episode of waking up at night choking with shortness of breath.  After this episode she developed fever and cough, which was treated by her PCP with albuterol and prednisone, with resolution of her symptoms in a few days.  No changes otherwise since her last visit.    OV 06/22/19: [name] is a 49 year old woman with a history of severe esophagitis, peptic ulcer disease with gastric outlet obstruction, status post partial gastrectomy, bilateral vagotomy, esophageal aperistalsis who presents for evaluation of an abnormal chest CT scan.  She has followed with GI for the last 5 to 6 years since she began having frequent vomiting and upper GI issues.  Over that time she has lost about 50 pounds from her previous baseline weight of 135 pounds.  She is recommended to get a jejunal feeding tube to address malnutrition given her refractory GI issues.  She has frequent acid reflux despite PPI 3 times a day and Pepcid.  She has been on promotility agents.  She vomits whenever she eats during the day, so she did avoids eating during the day and eats right before she lays down at night.  About 2 nights per week she wakes up coughing up  acid and choking on vomit.  At baseline she has no shortness of breath, sputum production, wheezing.  She has no known rheumatologic history.  She was previously seen by Dr. Amil Amen due to concern for scleroderma, who determined that she does not have a rheumatologic condition.  Serologies were performed during her work-up.  She had a chest x-ray performed in early May by GI after an episode of frequent vomiting.  Due to abnormalities she had a subsequent CT scan a week later.  There is a family history of MI in her mother but no family history of rheumatologic disease.  She is a current every day smoker, 0.5- 1 packs/day x 30 years.   Past Medical History:  Diagnosis Date  . Allergic rhinitis   . Anemia   . Anxiety disorder   . Calcaneus fracture, right 06/19/2017  . Chronic interstitial cystitis   . Chronic pain 06/19/2017  . Depression   . Gastric outlet obstruction   . Gastric ulcer   . GERD (gastroesophageal reflux disease)   . Hiatal hernia   . Hypothyroidism    in the past  . IBS (irritable bowel syndrome)   . IDA (iron deficiency anemia)   . Internal hemorrhoids   . Kidney stones   . Migraine   . Myalgia   . OCD (obsessive compulsive disorder)   . Ulcerative esophagitis   . Vitamin D deficiency      Family History  Problem Relation Age of Onset  .  Clotting disorder Mother   . Crohn's disease Mother   . Heart disease Mother   . Emphysema Mother   . Heart disease Father   . Breast cancer Maternal Aunt   . Colon cancer Neg Hx   . Stomach cancer Neg Hx   . Rectal cancer Neg Hx   . Esophageal cancer Neg Hx      Past Surgical History:  Procedure Laterality Date  . [place] STUDY N/A 03/15/2019   Procedure: [place] STUDY;  Surgeon: Jerene Bears, MD;  Location: [facility];  Service: Gastroenterology;  Laterality: N/A;  . ABDOMINAL HYSTERECTOMY    . [place] I PROCEDURE  06/22/2016   Dr Pauletta Browns---- due to gastric ulcer  . ESOPHAGEAL MANOMETRY N/A 03/15/2019    Procedure: ESOPHAGEAL MANOMETRY (EM);  Surgeon: Jerene Bears, MD;  Location: [facility];  Service: Gastroenterology;  Laterality: N/A;  . EXTERNAL EAR SURGERY     dont know specific ear sugery (inner/external)  . FRACTURE SURGERY     right arm  . mutliple surgeries due to intertitial cystitis     bladder stimulator  . ORIF TIBIA FRACTURE Right 06/17/2017   Procedure: OPEN REDUCTION INTERNAL FIXATION (ORIF) DISTAL TIBIA FRACTURE AND CALCANEOUS;  Surgeon: Altamese Delavan, MD;  Location: [facility];  Service: Orthopedics;  Laterality: Right;  . OTHER SURGICAL HISTORY     TENS implantation for Chronic Interstitial Cystitis    Social History   Socioeconomic History  . Marital status: Legally Separated    Spouse name: Not on file  . Number of children: Not on file  . Years of education: Not on file  . Highest education level: Not on file  Occupational History  . Occupation: Tourist information centre manager: [employer]  Tobacco Use  . Smoking status: Current Every Day Smoker    Packs/day: 0.50    Types: Cigarettes  . Smokeless tobacco: Never Used  Vaping Use  . Vaping Use: Never used  Substance and Sexual Activity  . Alcohol use: Not Currently  . Drug use: Never  . Sexual activity: Not on file  Other Topics Concern  . Not on file  Social History Narrative  . Not on file   Social Determinants of Health   Financial Resource Strain:   . Difficulty of Paying Living Expenses:   Food Insecurity:   . Worried About Charity fundraiser in the Last Year:   . Arboriculturist in the Last Year:   Transportation Needs:   . Film/video editor (Medical):   Marland Kitchen Lack of Transportation (Non-Medical):   Physical Activity:   . Days of Exercise per Week:   . Minutes of Exercise per Session:   Stress:   . Feeling of Stress :   Social Connections:   . Frequency of Communication with Friends and Family:   . Frequency of Social Gatherings with Friends and Family:   . Attends Religious Services:   . Active  Member of Clubs or Organizations:   . Attends Archivist Meetings:   Marland Kitchen Marital Status:   Intimate Partner Violence:   . Fear of Current or Ex-Partner:   . Emotionally Abused:   Marland Kitchen Physically Abused:   . Sexually Abused:      Allergies  Allergen Reactions  . Sulfamethoxazole Anaphylaxis  . Morphine And Related Other (See Comments)    Delirium. Makes me go "crazy"  . Xanax Starla Link Er] Other (See Comments)    Enhanced  depression; feels gloomy      There is no immunization history on file for this patient.  Outpatient Medications Prior to Visit  Medication Sig Dispense Refill  . acetaminophen (TYLENOL) 325 MG tablet Take 1-2 tablets (325-650 mg total) by mouth every 6 (six) hours as needed for mild pain (pain score 1-3 or temp > 100.5).    . Aspirin-Salicylamide-Caffeine (BC HEADACHE PO) Take 1 packet by mouth daily as needed.     . docusate sodium (COLACE) 100 MG capsule Take 1 capsule (100 mg total) by mouth 2 (two) times daily. 10 capsule 0  . famotidine (PEPCID) 20 MG tablet Take 1 tablet (20 mg total) by mouth 2 (two) times daily. 180 tablet 2  . gabapentin (NEURONTIN) 300 MG capsule Take 300 mg by mouth 3 (three) times daily.     . hydrOXYzine (ATARAX/VISTARIL) 25 MG tablet Take 25 mg by mouth at bedtime as needed for sleep.    Marland Kitchen lubiprostone (AMITIZA) 24 MCG capsule Take 1 capsule (24 mcg total) by mouth 2 (two) times daily with a meal. Pharmacy-please d/c rx for 8 mcg dosing 60 capsule 3  . metoCLOPramide (REGLAN) 5 MG tablet TAKE 1 TABLET BY MOUTH 4 TIMES A DAY 120 tablet 0  . pantoprazole (PROTONIX) 40 MG tablet Take 1 tablet (40 mg total) by mouth in the morning, at noon, and at bedtime. 270 tablet 1  . promethazine (PHENERGAN) 25 MG tablet Take 25 mg by mouth 3 (three) times daily as needed for nausea/vomiting.  1  . QUEtiapine (SEROQUEL) 50 MG tablet Take 50 mg by mouth at bedtime.  6  . sertraline (ZOLOFT) 50 MG tablet Take 50 mg by mouth daily.    .  sucralfate (CARAFATE) 1 GM/10ML suspension Take 10 mLs (1 g total) by mouth 4 (four) times daily. 3 times daily and at bedtime. 1200 mL 3  . traMADol (ULTRAM) 50 MG tablet 1-2 po q6 hr prn pain    . traZODone (DESYREL) 50 MG tablet Take 50 mg by mouth at bedtime.    . vitamin C (VITAMIN C) 500 MG tablet Take 1 tablet (500 mg total) by mouth daily. 30 tablet 1   No facility-administered medications prior to visit.    Review of Systems  Constitutional: Positive for malaise/fatigue and weight loss. Negative for chills and fever.  Respiratory: Positive for cough. Negative for sputum production, shortness of breath and wheezing.   Cardiovascular: Negative for leg swelling.  Gastrointestinal: Positive for constipation, heartburn, nausea and vomiting.  Neurological: Negative for focal weakness.     Objective:   Vitals:   07/11/19 1353  BP: 122/70  Pulse: 83  Temp: 98.2 F (36.8 C)  TempSrc: Oral  SpO2: 96%  Weight: 84 lb 12.8 oz (38.5 kg)  Height: 4\' 11"  (1.499 m)   96% on   RA BMI Readings from Last 3 Encounters:  07/11/19 17.13 kg/m  06/22/19 17.66 kg/m  05/09/19 16.48 kg/m   Wt Readings from Last 3 Encounters:  07/11/19 84 lb 12.8 oz (38.5 kg)  06/22/19 87 lb (39.5 kg)  05/09/19 83 lb (37.6 kg)    Physical Exam Vitals reviewed.  Constitutional:      General: She is not in acute distress.    Comments: Chronically ill, frail appearing woman  HENT:     Head: Normocephalic and atraumatic.  Eyes:     General: No scleral icterus. Cardiovascular:     Rate and Rhythm: Normal rate and regular rhythm.  Heart sounds: No murmur heard.   Abdominal:     Palpations: Abdomen is soft.     Tenderness: There is no abdominal tenderness.  Musculoskeletal:        General: No swelling or deformity.     Cervical back: Neck supple.  Lymphadenopathy:     Cervical: No cervical adenopathy.  Skin:    General: Skin is warm and dry.     Findings: No rash.  Neurological:      General: No focal deficit present.     Mental Status: She is alert.     Coordination: Coordination normal.  Psychiatric:        Mood and Affect: Mood normal.        Behavior: Behavior normal.      CBC    Component Value Date/Time   WBC 8.1 05/16/2019 1109   RBC 3.89 05/16/2019 1109   HGB 11.8 (L) 05/16/2019 1109   HGB 7.4 (L) 09/05/2015 1350   HCT 36.2 05/16/2019 1109   HCT 27.1 (L) 09/05/2015 1350   PLT 227.0 05/16/2019 1109   PLT 241 09/05/2015 1350   MCV 93.2 05/16/2019 1109   MCV 71.9 (L) 09/05/2015 1350   MCH 30.3 06/19/2017 0417   MCHC 32.5 05/16/2019 1109   RDW 13.7 05/16/2019 1109   RDW 20.3 (H) 09/05/2015 1350   LYMPHSABS 2.3 05/16/2019 1109   LYMPHSABS 3.0 09/05/2015 1350   MONOABS 0.8 05/16/2019 1109   MONOABS 0.9 09/05/2015 1350   EOSABS 0.2 05/16/2019 1109   EOSABS 0.2 09/05/2015 1350   BASOSABS 0.0 05/16/2019 1109   BASOSABS 0.0 09/05/2015 1350    CHEMISTRY No results for input(s): NA, K, CL, CO2, GLUCOSE, BUN, CREATININE, CALCIUM, MG, PHOS in the last 168 hours. CrCl cannot be calculated (Patient's most recent lab result is older than the maximum 21 days allowed.).   Chest Imaging- films reviewed: CT chest 05/24/2019-paraseptal emphysema in the anterior upper lobes.  Centrilobular emphysema large groundglass opacities, most notable in the right upper lobe.  More subtle groundglass opacities in the lingula, lateral right middle lobe, bilateral lower lobes.  Evidence of fibrosis in bilateral bases, possibly subpleural cysts in the right base.  5 mm right middle lobe nodule.  Scattered borderline enlarged mediastinal lymph nodes.  No hilar adenopathy.  Dilated esophagus with air-fluid level in the lower thorax. Compared to lower lung zones in 2017, new scarring and groundglass  HRCT chest 07/07/2019-resolved upper lobe GGO's, ongoing upper lobe predominant emphysema.  Bilateral lower lobe groundglass with increased intralobular and interlobular septal  thickening.  Fibrosis most notable in the dependent lower lobes.  Pulmonary Functions Testing Results: No flowsheet data found.      Assessment & Plan:     ICD-10-CM   1. Tobacco abuse  Z72.0   2. Gastroesophageal reflux disease with esophagitis without hemorrhage  K21.00   3. Chronic pulmonary aspiration, sequela  T17.908S   4. Pulmonary emphysema, unspecified emphysema type ([place])  J43.9     Abnormal chest CT - basilar fibrosis likely due to recurrent aspirations.  Repeat CT chest demonstrates resolution of groundglass, which was likely inflammation from aspiration pneumonitis.  The distribution of her fibrosis and previous negative rheumatologic work-up makes ILD associated with an autoimmune disease unlikely. Esophagitis, GERD, esophageal aperistalsis -Reviewed HRCT chest during visit -Agree with J-tube and avoidance of significant p.o. intake.  We discussed that recurrent aspirations put her at high risk for bacterial pneumonia, which could be very dangerous for her with chronic malnutrition.  Recommend avoiding any p.o. intake, including liquids for several hours before going to bed. -Follow-up HRCT chest in 1 year to ensure no significant progression.  Tobacco abuse; mild emphysema on CT scan -Discussed again the importance of smoking cessation.  She believes her biggest barrier is wanting to quit.  I requested she let us know whenever she is ready to discuss smoking cessation more. -Will need lung cancer screening at age 12.  -RTC in 1 year or sooner as needed.   Current Outpatient Medications:  .  acetaminophen (TYLENOL) 325 MG tablet, Take 1-2 tablets (325-650 mg total) by mouth every 6 (six) hours as needed for mild pain (pain score 1-3 or temp > 100.5)., Disp: , Rfl:  .  Aspirin-Salicylamide-Caffeine (BC HEADACHE PO), Take 1 packet by mouth daily as needed. , Disp: , Rfl:  .  docusate sodium (COLACE) 100 MG capsule, Take 1 capsule (100 mg total) by mouth 2 (two) times  daily., Disp: 10 capsule, Rfl: 0 .  famotidine (PEPCID) 20 MG tablet, Take 1 tablet (20 mg total) by mouth 2 (two) times daily., Disp: 180 tablet, Rfl: 2 .  gabapentin (NEURONTIN) 300 MG capsule, Take 300 mg by mouth 3 (three) times daily. , Disp: , Rfl:  .  hydrOXYzine (ATARAX/VISTARIL) 25 MG tablet, Take 25 mg by mouth at bedtime as needed for sleep., Disp: , Rfl:  .  lubiprostone (AMITIZA) 24 MCG capsule, Take 1 capsule (24 mcg total) by mouth 2 (two) times daily with a meal. Pharmacy-please d/c rx for 8 mcg dosing, Disp: 60 capsule, Rfl: 3 .  metoCLOPramide (REGLAN) 5 MG tablet, TAKE 1 TABLET BY MOUTH 4 TIMES A DAY, Disp: 120 tablet, Rfl: 0 .  pantoprazole (PROTONIX) 40 MG tablet, Take 1 tablet (40 mg total) by mouth in the morning, at noon, and at bedtime., Disp: 270 tablet, Rfl: 1 .  promethazine (PHENERGAN) 25 MG tablet, Take 25 mg by mouth 3 (three) times daily as needed for nausea/vomiting., Disp: , Rfl: 1 .  QUEtiapine (SEROQUEL) 50 MG tablet, Take 50 mg by mouth at bedtime., Disp: , Rfl: 6 .  sertraline (ZOLOFT) 50 MG tablet, Take 50 mg by mouth daily., Disp: , Rfl:  .  sucralfate (CARAFATE) 1 GM/10ML suspension, Take 10 mLs (1 g total) by mouth 4 (four) times daily. 3 times daily and at bedtime., Disp: 1200 mL, Rfl: 3 .  traMADol (ULTRAM) 50 MG tablet, 1-2 po q6 hr prn pain, Disp: , Rfl:  .  traZODone (DESYREL) 50 MG tablet, Take 50 mg by mouth at bedtime., Disp: , Rfl:  .  vitamin C (VITAMIN C) 500 MG tablet, Take 1 tablet (500 mg total) by mouth daily., Disp: 30 tablet, Rfl: 1   Julian Hy, DO [facility] 07/11/2019 2:09 PM

## 2019-07-11 NOTE — Patient Instructions (Addendum)
Thank you for visiting Dr. Carlis Abbott at [facility]. We recommend the following: Orders Placed This Encounter  Procedures  . CT Chest High Resolution   Orders Placed This Encounter  Procedures  . CT Chest High Resolution    June 2022    Standing Status:   Future    Standing Expiration Date:   07/10/2020    Order Specific Question:   ** REASON FOR EXAM (FREE TEXT)    Answer:   GERD-related fibrosis    Order Specific Question:   Is patient pregnant?    Answer:   Unknown (Please Explain)    Order Specific Question:   Preferred imaging location?    Answer:   [facility]    Order Specific Question:   Radiology Contrast Protocol - do NOT remove file path    Answer:   [email or web address]      Return in about 1 year (around 07/10/2020).    Please do your part to reduce the spread of COVID-19.  It is very important that you stop smoking or vaping. This is the single most important thing that you can do to improve your lung health.   S = Set a quit date. T = Tell family, friends, and the people around you that you plan to quit. A = Anticipate or plan ahead for the tough times you'll face while quitting. R = Remove cigarettes and other tobacco products from your home, car, and work. T = Talk to [place] about getting help to quit.  If you need help, please reach out to our office or the smoking cessation resources available: [place] Smoking Cessation Class: [phone] 1-800-QUIT-NOW www.BeTobaccoFree.gov

## 2019-07-11 NOTE — Telephone Encounter (Signed)
[name] from [facility] called to inform that she received the referral for J-tube and it is being reviewed by MDs. She will call you once she has an appt available for pt.

## 2019-07-18 ENCOUNTER — Telehealth: Payer: Self-pay | Admitting: Internal Medicine

## 2019-07-18 NOTE — Telephone Encounter (Signed)
Called and left message for [facility] to call back regarding status of referral/Appt for pt.

## 2019-07-19 NOTE — Telephone Encounter (Signed)
Pt is scheduled to see Dr. Hassell Done at the [facility] office 7/14@10 :15am per [facility].

## 2019-07-25 ENCOUNTER — Other Ambulatory Visit: Payer: Self-pay

## 2019-07-25 ENCOUNTER — Telehealth: Payer: Self-pay | Admitting: Internal Medicine

## 2019-07-25 MED ORDER — FLUCONAZOLE 100 MG PO TABS: ORAL_TABLET | ORAL | 0 refills | Status: DC

## 2019-07-25 NOTE — Telephone Encounter (Signed)
Pt states that her throat is hurting, states it is not like a sore throat. Pt reports her esophagus hurts when she swallows and states it has been worse the last couple of days. Pt wants to know what Dr. Hilarie [name] suggest she do regarding the discomfort. She has appt to meet with surgeon tomorrow. Please advise.

## 2019-07-25 NOTE — Telephone Encounter (Signed)
She has considerable esophageal stasis Let us treat empirically for Candida esophagitis with fluconazole 200 mg x 1 day and 100 mg x 13 days She should remain on PPI

## 2019-07-25 NOTE — Telephone Encounter (Signed)
Left message for pt to call back. Script sent to pharmacy. 

## 2019-07-26 NOTE — Telephone Encounter (Signed)
Spoke with pt and she is aware, script has been sent to pharmacy.

## 2019-07-31 ENCOUNTER — Inpatient Hospital Stay (HOSPITAL_COMMUNITY): Admission: RE | Admit: 2019-07-31 | Payer: Medicare Other | Source: Ambulatory Visit

## 2019-08-07 ENCOUNTER — Ambulatory Visit: Payer: Self-pay | Admitting: Surgery

## 2019-08-07 ENCOUNTER — Encounter (HOSPITAL_COMMUNITY)
Admission: RE | Admit: 2019-08-07 | Discharge: 2019-08-07 | Disposition: A | Discharge status: Home / Self Care | Payer: Medicare Other | Source: Ambulatory Visit | Attending: Surgery | Admitting: Surgery

## 2019-08-07 ENCOUNTER — Encounter (HOSPITAL_COMMUNITY): Payer: Self-pay | Admitting: Surgery

## 2019-08-07 ENCOUNTER — Other Ambulatory Visit (HOSPITAL_COMMUNITY): Payer: Medicare Other

## 2019-08-07 ENCOUNTER — Other Ambulatory Visit (HOSPITAL_COMMUNITY)
Admission: RE | Admit: 2019-08-07 | Discharge: 2019-08-07 | Disposition: A | Discharge status: Home / Self Care | Payer: Medicare Other | Source: Ambulatory Visit | Attending: Surgery | Admitting: Surgery

## 2019-08-07 ENCOUNTER — Other Ambulatory Visit: Payer: Self-pay

## 2019-08-07 ENCOUNTER — Encounter (HOSPITAL_COMMUNITY): Payer: Self-pay

## 2019-08-07 DIAGNOSIS — Z20822 Contact with and (suspected) exposure to covid-19: Secondary | ICD-10-CM | POA: Diagnosis not present

## 2019-08-07 DIAGNOSIS — Z01812 Encounter for preprocedural laboratory examination: Secondary | ICD-10-CM | POA: Insufficient documentation

## 2019-08-07 HISTORY — DX: Personal history of urinary calculi: Z87.442

## 2019-08-07 HISTORY — DX: Chronic obstructive pulmonary disease, unspecified: J44.9

## 2019-08-07 HISTORY — DX: Fibromyalgia: M79.7

## 2019-08-07 LAB — SARS CORONAVIRUS 2 (TAT 6-24 HRS): SARS Coronavirus 2: NEGATIVE

## 2019-08-07 NOTE — Progress Notes (Addendum)
Anesthesia Review:  PCP: Cardiologist : Chest x-ray : CHEST CT- 07/07/19  EKG : Echo : Cardiac Cath :  Activity level: can do a flight of stairs without difficulty  Sleep Study/ CPAP : Fasting Blood Sugar :      / Checks Blood Sugar -- times a day:   Blood Thinner/ Instructions /Last Dose: ASA / Instructions/ Last Dose :  Hx of gastrectomy with bilateral vagotomy  Ulcerative esophagitis and esophageal apenstalsis  LOV- pulmonary - 07/11/19- epic  Hx of aspiration durinig sleep

## 2019-08-08 MED ORDER — BUPIVACAINE LIPOSOME 1.3 % IJ SUSP
20.0000 mL | Freq: Once | INTRAMUSCULAR | Status: DC
  Filled 2019-08-08: qty 20

## 2019-08-08 NOTE — Anesthesia Preprocedure Evaluation (Deleted)
Anesthesia Evaluation    Airway        Dental   Pulmonary Current Smoker and Patient abstained from smoking.,           Cardiovascular      Neuro/Psych    GI/Hepatic   Endo/Other    Renal/GU      Musculoskeletal   Abdominal   Peds  Hematology   Anesthesia Other Findings   Reproductive/Obstetrics                             Anesthesia Physical Anesthesia Plan  ASA:   Anesthesia Plan:    Post-op Pain Management:    Induction:   PONV Risk Score and Plan:   Airway Management Planned:   Additional Equipment:   Intra-op Plan:   Post-operative Plan:   Informed Consent:   Plan Discussed with:   Anesthesia Plan Comments: (See PAT note 08/08/2019, Konrad Felix, PA-C)        Anesthesia Quick Evaluation

## 2019-08-08 NOTE — Anesthesia Preprocedure Evaluation (Addendum)
Anesthesia Evaluation  Patient identified by MRN, date of birth, ID band Patient awake    Reviewed: Allergy & Precautions, NPO status , Patient's Chart, lab work & pertinent test results  History of Anesthesia Complications Negative for: history of anesthetic complications  Airway Mallampati: II  TM Distance: >3 FB Neck ROM: Full    Dental no notable dental hx. (+) Dental Advisory Given   Pulmonary COPD, Current SmokerPatient did not abstain from smoking.,    Pulmonary exam normal        Cardiovascular negative cardio ROS Normal cardiovascular exam     Neuro/Psych  Headaches, PSYCHIATRIC DISORDERS Anxiety Depression negative neurological ROS     GI/Hepatic Neg liver ROS, GERD  ,IBS   Endo/Other  negative endocrine ROS  Renal/GU negative Renal ROS  negative genitourinary   Musculoskeletal negative musculoskeletal ROS (+)   Abdominal   Peds negative pediatric ROS (+)  Hematology negative hematology ROS (+)   Anesthesia Other Findings   Reproductive/Obstetrics negative OB ROS                           Anesthesia Physical  Anesthesia Plan  ASA: III  Anesthesia Plan: General   Post-op Pain Management:    Induction: Intravenous  PONV Risk Score and Plan: 2 and Ondansetron and Midazolam  Airway Management Planned: Oral ETT  Additional Equipment:   Intra-op Plan:   Post-operative Plan: Extubation in OR  Informed Consent: I have reviewed the patients History and Physical, chart, labs and discussed the procedure including the risks, benefits and alternatives for the proposed anesthesia with the patient or authorized representative who has indicated his/her understanding and acceptance.     Dental advisory given  Plan Discussed with: CRNA and Anesthesiologist  Anesthesia Plan Comments:        Anesthesia Quick Evaluation

## 2019-08-08 NOTE — Progress Notes (Signed)
Anesthesia Chart Review   Case: [id number] Date/Time: 08/09/19 0845   Procedure: LAPAROSCOPIC FEEDING JEJUNOSTOMY (N/A )   Anesthesia type: General   Pre-op diagnosis: MALNUTRITION   Location: [place] 01 / [facility]   Surgeons: Johnathan Hausen, MD      DISCUSSION:49 y.o. current every day smoker with h/o GERD s/p hiatal hernia repair, COPD, malnutrition scheduled for above procedure 08/09/2019 with Dr. Johnathan Hausen.   Pt with h/o recurrent aspiration and vomiting episodes due to severe esophagitis and severe esophageal aperistalsis, PUD with previous gastric outlet obstruction, s/p gastrectomy and bilateral vagotomy.  Pt is having J-tube placed due to repeated episodes of waking up at night choking with shortness of breath.   Pt same day workup, denied current respiratory sx during phone interview.  Anesthesia to evaluate DOS.  VS: Ht 4\' 11"  (1.499 m)   Wt (!) 36.3 kg   BMI 16.16 kg/m   PROVIDERS: Cyndi Bender, PA-C is PCP   Noemi Chapel, DO is Pulmonologist  LABS: labs DOS (all labs ordered are listed, but only abnormal results are displayed)  Labs Reviewed - No data to display   IMAGES: CT Chest 07/07/2019 IMPRESSION: 1. There is bibasilar predominant irregular interstitial and ground-glass opacity in both a peripheral and peribronchovascular distribution. Some areas of ground-glass opacity seen on prior examination, for example in the right pulmonary apex have resolved in the interval, possibly reflecting resolution of acutely superimposed infection or inflammatory flare. Mild tubular bronchiectasis without evidence of bronchiolectasis or honeycombing. Findings are consistent with a mild "alternative diagnosis" pattern by ATS pulmonary fibrosis criteria, leading differential consideration connective tissue disorder associated fibrosis given overall constellation of findings. 2. Mild, predominantly paraseptal emphysema. Emphysema (ICD10-J43.9). 3. Thickened, patulous  esophagus, in keeping with nonspecific infectious or inflammatory esophagitis although concerning for scleroderma given this particular appearance. 4. Evidence of prior hiatal hernia repair. 5. Redemonstrated high-grade wedge deformity of the T7 vertebral body.  EKG:   CV:  Past Medical History:  Diagnosis Date  . Allergic rhinitis   . Anemia   . Anxiety disorder   . Aspiration into airway    per pt hx on 08/07/19 aspirates food into lungs when asleep   . Calcaneus fracture, right 06/19/2017  . Chronic interstitial cystitis   . Chronic pain 06/19/2017  . COPD (chronic obstructive pulmonary disease) ([place])   . Depression   . Fibromyalgia   . Gastric outlet obstruction   . Gastric ulcer   . GERD (gastroesophageal reflux disease)   . Hiatal hernia   . History of kidney stones   . Hypothyroidism    in the past  . IBS (irritable bowel syndrome)   . IDA (iron deficiency anemia)   . Internal hemorrhoids   . Kidney stones   . Myalgia   . OCD (obsessive compulsive disorder)   . OCD (obsessive compulsive disorder)   . Ulcerative esophagitis   . Vitamin D deficiency     Past Surgical History:  Procedure Laterality Date  . [place] STUDY N/A 03/15/2019   Procedure: [place] STUDY;  Surgeon: Jerene Bears, MD;  [facility];  Service: Gastroenterology;  Laterality: N/A;  . ABDOMINAL HYSTERECTOMY    . [place] I PROCEDURE  06/22/2016   Dr Pauletta Browns---- due to gastric ulcer  . ESOPHAGEAL MANOMETRY N/A 03/15/2019   Procedure: ESOPHAGEAL MANOMETRY (EM);  Surgeon: Jerene Bears, MD;  [facility];  Service: Gastroenterology;  Laterality: N/A;  . EXTERNAL EAR SURGERY  dont know specific ear sugery (inner/external)  . FRACTURE SURGERY     right arm  . gastrectomy and bilateral vagotomy     . mutliple surgeries due to intertitial cystitis     bladder stimulator  . ORIF TIBIA FRACTURE Right 06/17/2017   Procedure: OPEN REDUCTION INTERNAL FIXATION (ORIF) DISTAL TIBIA  FRACTURE AND CALCANEOUS;  Surgeon: Altamese [place], MD;  Location: [place];  Service: Orthopedics;  Laterality: Right;  . OTHER SURGICAL HISTORY     TENS implantation for Chronic Interstitial Cystitis    MEDICATIONS: . [START ON 08/09/2019] bupivacaine liposome (EXPAREL) 1.3 % injection 266 mg   . acetaminophen (TYLENOL) 325 MG tablet  . Aspirin-Salicylamide-Caffeine (BC HEADACHE PO)  . docusate sodium (COLACE) 100 MG capsule  . famotidine (PEPCID) 20 MG tablet  . fluconazole (DIFLUCAN) 100 MG tablet  . gabapentin (NEURONTIN) 300 MG capsule  . hydrOXYzine (ATARAX/VISTARIL) 25 MG tablet  . lubiprostone (AMITIZA) 24 MCG capsule  . metoCLOPramide (REGLAN) 5 MG tablet  . pantoprazole (PROTONIX) 40 MG tablet  . promethazine (PHENERGAN) 25 MG tablet  . QUEtiapine (SEROQUEL) 50 MG tablet  . sertraline (ZOLOFT) 50 MG tablet  . sucralfate (CARAFATE) 1 GM/10ML suspension  . traMADol (ULTRAM) 50 MG tablet  . traZODone (DESYREL) 50 MG tablet  . vitamin C (VITAMIN C) 500 MG tablet    Konrad Felix, PA-C [facility] Pre[facility]Surgical Testing [phone]

## 2019-08-09 ENCOUNTER — Ambulatory Visit (HOSPITAL_COMMUNITY): Payer: Medicare Other | Admitting: Physician Assistant

## 2019-08-09 ENCOUNTER — Encounter (HOSPITAL_COMMUNITY)
Admission: RE | Disposition: A | Discharge status: Home / Self Care | Payer: Self-pay | Source: Home / Self Care | Attending: Surgery

## 2019-08-09 ENCOUNTER — Observation Stay (HOSPITAL_COMMUNITY)
Admission: RE | Admit: 2019-08-09 | Discharge: 2019-08-12 | Disposition: A | Discharge status: Home / Self Care | Payer: Medicare Other | Attending: Surgery | Admitting: Surgery

## 2019-08-09 ENCOUNTER — Encounter (HOSPITAL_COMMUNITY): Payer: Self-pay | Admitting: Surgery

## 2019-08-09 DIAGNOSIS — K311 Adult hypertrophic pyloric stenosis: Secondary | ICD-10-CM | POA: Insufficient documentation

## 2019-08-09 DIAGNOSIS — E46 Unspecified protein-calorie malnutrition: Principal | ICD-10-CM | POA: Diagnosis present

## 2019-08-09 DIAGNOSIS — E039 Hypothyroidism, unspecified: Secondary | ICD-10-CM | POA: Insufficient documentation

## 2019-08-09 DIAGNOSIS — F17210 Nicotine dependence, cigarettes, uncomplicated: Secondary | ICD-10-CM | POA: Diagnosis not present

## 2019-08-09 DIAGNOSIS — Z79899 Other long term (current) drug therapy: Secondary | ICD-10-CM | POA: Diagnosis not present

## 2019-08-09 DIAGNOSIS — R112 Nausea with vomiting, unspecified: Secondary | ICD-10-CM | POA: Diagnosis present

## 2019-08-09 DIAGNOSIS — E43 Unspecified severe protein-calorie malnutrition: Secondary | ICD-10-CM | POA: Insufficient documentation

## 2019-08-09 DIAGNOSIS — K220 Achalasia of cardia: Secondary | ICD-10-CM | POA: Insufficient documentation

## 2019-08-09 DIAGNOSIS — J449 Chronic obstructive pulmonary disease, unspecified: Secondary | ICD-10-CM | POA: Insufficient documentation

## 2019-08-09 HISTORY — DX: Unspecified foreign body in respiratory tract, part unspecified causing other injury, initial encounter: T17.908A

## 2019-08-09 HISTORY — PX: LAPAROSCOPIC JEJUNOSTOMY: SHX6880

## 2019-08-09 LAB — CBC
HCT: 38.2 % (ref 36.0–46.0)
HCT: 41.7 % (ref 36.0–46.0)
Hemoglobin: 11.5 g/dL — ABNORMAL LOW (ref 12.0–15.0)
Hemoglobin: 13 g/dL (ref 12.0–15.0)
MCH: 30.5 pg (ref 26.0–34.0)
MCH: 31 pg (ref 26.0–34.0)
MCHC: 30.1 g/dL (ref 30.0–36.0)
MCHC: 31.2 g/dL (ref 30.0–36.0)
MCV: 101.3 fL — ABNORMAL HIGH (ref 80.0–100.0)
MCV: 99.5 fL (ref 80.0–100.0)
Platelets: 239 10*3/uL (ref 150–400)
Platelets: 243 10*3/uL (ref 150–400)
RBC: 3.77 MIL/uL — ABNORMAL LOW (ref 3.87–5.11)
RBC: 4.19 MIL/uL (ref 3.87–5.11)
RDW: 16.5 % — ABNORMAL HIGH (ref 11.5–15.5)
RDW: 16.5 % — ABNORMAL HIGH (ref 11.5–15.5)
WBC: 7.9 10*3/uL (ref 4.0–10.5)
WBC: 12.8 10*3/uL — ABNORMAL HIGH (ref 4.0–10.5)
nRBC: 0 % (ref 0.0–0.2)
nRBC: 0 % (ref 0.0–0.2)

## 2019-08-09 LAB — CREATININE, SERUM
Creatinine, Ser: 0.72 mg/dL (ref 0.44–1.00)
GFR calc Af Amer: >60 mL/min (ref >60)
GFR calc non Af Amer: >60 mL/min (ref >60)

## 2019-08-09 SURGERY — CREATION, JEJUNOSTOMY, LAPAROSCOPIC
Anesthesia: General | Site: Abdomen

## 2019-08-09 MED ORDER — ONDANSETRON 4 MG PO TBDP
4.0000 mg | ORAL_TABLET | Freq: Four times a day (QID) | ORAL | Status: DC | PRN
  Administered 2019-08-09: 4 mg via ORAL
  Filled 2019-08-09: qty 1

## 2019-08-09 MED ORDER — ROCURONIUM BROMIDE 10 MG/ML (PF) SYRINGE
PREFILLED_SYRINGE | INTRAVENOUS | Status: DC | PRN
  Administered 2019-08-09: 10 mg via INTRAVENOUS
  Administered 2019-08-09: 30 mg via INTRAVENOUS
  Administered 2019-08-09: 20 mg via INTRAVENOUS
  Administered 2019-08-09: 10 mg via INTRAVENOUS

## 2019-08-09 MED ORDER — HYDROMORPHONE HCL 1 MG/ML IJ SOLN
0.5000 mg | INTRAMUSCULAR | Status: DC | PRN
  Administered 2019-08-09 (×2): 0.5 mg via INTRAVENOUS
  Administered 2019-08-09 – 2019-08-10 (×5): 1 mg via INTRAVENOUS
  Filled 2019-08-09 (×8): qty 1

## 2019-08-09 MED ORDER — NICOTINE 14 MG/24HR TD PT24
14.0000 mg | MEDICATED_PATCH | Freq: Every day | TRANSDERMAL | Status: DC
  Administered 2019-08-09 – 2019-08-12 (×4): 14 mg via TRANSDERMAL
  Filled 2019-08-09 (×4): qty 1

## 2019-08-09 MED ORDER — ONDANSETRON HCL 4 MG/2ML IJ SOLN
INTRAMUSCULAR | Status: DC | PRN
  Administered 2019-08-09: 4 mg via INTRAVENOUS

## 2019-08-09 MED ORDER — KETAMINE HCL 10 MG/ML IJ SOLN
INTRAMUSCULAR | Status: DC | PRN
Start: 2019-08-09 — End: 2019-08-09
  Administered 2019-08-09: 10 mg via INTRAVENOUS

## 2019-08-09 MED ORDER — KCL IN DEXTROSE-NACL 20-5-0.45 MEQ/L-%-% IV SOLN
INTRAVENOUS | Status: DC
  Filled 2019-08-09 (×5): qty 1000

## 2019-08-09 MED ORDER — CEFAZOLIN SODIUM-DEXTROSE 2-4 GM/100ML-% IV SOLN
2.0000 g | INTRAVENOUS | Status: AC
  Administered 2019-08-09: 2 g via INTRAVENOUS
  Filled 2019-08-09: qty 100

## 2019-08-09 MED ORDER — MIDAZOLAM HCL 5 MG/5ML IJ SOLN
INTRAMUSCULAR | Status: DC | PRN
  Administered 2019-08-09: 1 mg via INTRAVENOUS

## 2019-08-09 MED ORDER — CHLORHEXIDINE GLUCONATE 0.12 % MT SOLN
15.0000 mL | Freq: Once | OROMUCOSAL | Status: AC
  Administered 2019-08-09: 15 mL via OROMUCOSAL

## 2019-08-09 MED ORDER — HEPARIN SODIUM (PORCINE) 5000 UNIT/ML IJ SOLN
5000.0000 [IU] | Freq: Three times a day (TID) | INTRAMUSCULAR | Status: DC
  Administered 2019-08-09 – 2019-08-12 (×8): 5000 [IU] via SUBCUTANEOUS
  Filled 2019-08-09 (×8): qty 1

## 2019-08-09 MED ORDER — ORAL CARE MOUTH RINSE: 15.0000 mL | Freq: Once | OROMUCOSAL | Status: AC

## 2019-08-09 MED ORDER — BUPIVACAINE LIPOSOME 1.3 % IJ SUSP
INTRAMUSCULAR | Status: DC | PRN
  Administered 2019-08-09: 20 mL

## 2019-08-09 MED ORDER — PROPOFOL 10 MG/ML IV BOLUS
INTRAVENOUS | Status: DC | PRN
  Administered 2019-08-09: 100 mg via INTRAVENOUS

## 2019-08-09 MED ORDER — FENTANYL CITRATE (PF) 100 MCG/2ML IJ SOLN
INTRAMUSCULAR | Status: AC
  Filled 2019-08-09: qty 2

## 2019-08-09 MED ORDER — FENTANYL CITRATE (PF) 100 MCG/2ML IJ SOLN
INTRAMUSCULAR | Status: DC | PRN
  Administered 2019-08-09 (×2): 50 ug via INTRAVENOUS
  Administered 2019-08-09 (×2): 25 ug via INTRAVENOUS

## 2019-08-09 MED ORDER — CHLORHEXIDINE GLUCONATE CLOTH 2 % EX PADS: 6.0000 | MEDICATED_PAD | Freq: Once | CUTANEOUS | Status: DC

## 2019-08-09 MED ORDER — ONDANSETRON HCL 4 MG/2ML IJ SOLN
4.0000 mg | Freq: Four times a day (QID) | INTRAMUSCULAR | Status: DC | PRN
  Administered 2019-08-10 – 2019-08-12 (×6): 4 mg via INTRAVENOUS
  Filled 2019-08-09 (×6): qty 2

## 2019-08-09 MED ORDER — EPHEDRINE 5 MG/ML INJ
INTRAVENOUS | Status: AC
  Filled 2019-08-09: qty 20

## 2019-08-09 MED ORDER — PANTOPRAZOLE SODIUM 40 MG IV SOLR
40.0000 mg | Freq: Every day | INTRAVENOUS | Status: DC
  Administered 2019-08-09 – 2019-08-11 (×3): 40 mg via INTRAVENOUS
  Filled 2019-08-09 (×3): qty 40

## 2019-08-09 MED ORDER — ACETAMINOPHEN 500 MG PO TABS
1000.0000 mg | ORAL_TABLET | Freq: Once | ORAL | Status: AC
  Administered 2019-08-09: 1000 mg via ORAL
  Filled 2019-08-09: qty 2

## 2019-08-09 MED ORDER — CEFAZOLIN SODIUM-DEXTROSE 2-4 GM/100ML-% IV SOLN
2.0000 g | Freq: Three times a day (TID) | INTRAVENOUS | Status: AC
  Administered 2019-08-09: 2 g via INTRAVENOUS
  Filled 2019-08-09: qty 100

## 2019-08-09 MED ORDER — DEXAMETHASONE SODIUM PHOSPHATE 10 MG/ML IJ SOLN
INTRAMUSCULAR | Status: DC | PRN
  Administered 2019-08-09: 4 mg via INTRAVENOUS

## 2019-08-09 MED ORDER — LACTATED RINGERS IV SOLN: INTRAVENOUS | Status: DC

## 2019-08-09 MED ORDER — LIDOCAINE 2% (20 MG/ML) 5 ML SYRINGE
INTRAMUSCULAR | Status: DC | PRN
  Administered 2019-08-09: 40 mg via INTRAVENOUS

## 2019-08-09 MED ORDER — HEPARIN SODIUM (PORCINE) 5000 UNIT/ML IJ SOLN
5000.0000 [IU] | Freq: Once | INTRAMUSCULAR | Status: AC
  Administered 2019-08-09: 5000 [IU] via SUBCUTANEOUS
  Filled 2019-08-09: qty 1

## 2019-08-09 MED ORDER — 0.9 % SODIUM CHLORIDE (POUR BTL) OPTIME
TOPICAL | Status: DC | PRN
  Administered 2019-08-09: 1000 mL

## 2019-08-09 MED ORDER — FENTANYL CITRATE (PF) 100 MCG/2ML IJ SOLN: 25.0000 ug | INTRAMUSCULAR | Status: DC | PRN

## 2019-08-09 MED ORDER — SUGAMMADEX SODIUM 200 MG/2ML IV SOLN
INTRAVENOUS | Status: DC | PRN
  Administered 2019-08-09: 150 mg via INTRAVENOUS

## 2019-08-09 MED ORDER — EPHEDRINE SULFATE-NACL 50-0.9 MG/10ML-% IV SOSY
PREFILLED_SYRINGE | INTRAVENOUS | Status: DC | PRN
  Administered 2019-08-09 (×5): 5 mg via INTRAVENOUS

## 2019-08-09 MED ORDER — MIDAZOLAM HCL 2 MG/2ML IJ SOLN
INTRAMUSCULAR | Status: AC
  Filled 2019-08-09: qty 2

## 2019-08-09 MED ORDER — PROPOFOL 10 MG/ML IV BOLUS
INTRAVENOUS | Status: AC
  Filled 2019-08-09: qty 20

## 2019-08-09 MED ORDER — METOPROLOL TARTRATE 5 MG/5ML IV SOLN: 5.0000 mg | Freq: Four times a day (QID) | INTRAVENOUS | Status: DC | PRN

## 2019-08-09 MED ORDER — PROMETHAZINE HCL 25 MG/ML IJ SOLN: 6.2500 mg | INTRAMUSCULAR | Status: DC | PRN

## 2019-08-09 SURGICAL SUPPLY — 82 items
APL SKNCLS STERI-STRIP NONHPOA (GAUZE/BANDAGES/DRESSINGS)
APL SRG 32X5 SNPLK LF DISP (MISCELLANEOUS) ×1
APL SWBSTK 6 STRL LF DISP (MISCELLANEOUS) ×2
APPLICATOR COTTON TIP 6 STRL (MISCELLANEOUS) ×2 IMPLANT
APPLICATOR COTTON TIP 6IN STRL (MISCELLANEOUS) ×6
BENZOIN TINCTURE PRP APPL 2/3 (GAUZE/BANDAGES/DRESSINGS) IMPLANT
BLADE SURG 15 STRL LF DISP TIS (BLADE) ×1 IMPLANT
BLADE SURG 15 STRL SS (BLADE) ×3
CABLE HIGH FREQUENCY MONO STRZ (ELECTRODE) ×3 IMPLANT
CATH ROBINSON RED A/P 18FR (CATHETERS) ×2 IMPLANT
CLIP SUT LAPRA TY ABSORB (SUTURE) IMPLANT
CLOSURE WOUND 1/2 X4 (GAUZE/BANDAGES/DRESSINGS)
COVER WAND RF STERILE (DRAPES) IMPLANT
DEVICE SUTURE ENDOST 10MM (ENDOMECHANICALS) IMPLANT
DISSECTOR BLUNT TIP ENDO 5MM (MISCELLANEOUS) ×3 IMPLANT
DRAIN PENROSE 0.25X18 (DRAIN) ×3 IMPLANT
DRSG OPSITE POSTOP 4X6 (GAUZE/BANDAGES/DRESSINGS) ×2 IMPLANT
DRSG TEGADERM 2-3/8X2-3/4 SM (GAUZE/BANDAGES/DRESSINGS) ×2 IMPLANT
DRSG TEGADERM 4X4.75 (GAUZE/BANDAGES/DRESSINGS) ×2 IMPLANT
GAUZE 4X4 16PLY RFD (DISPOSABLE) ×3 IMPLANT
GAUZE SPONGE 2X2 8PLY STRL LF (GAUZE/BANDAGES/DRESSINGS) IMPLANT
GAUZE SPONGE 4X4 12PLY STRL (GAUZE/BANDAGES/DRESSINGS) ×3 IMPLANT
GLOVE BIOGEL M 8.0 STRL (GLOVE) ×3 IMPLANT
GOWN SPEC L4 XLG W/TWL (GOWN DISPOSABLE) ×3 IMPLANT
GOWN STRL REUS W/TWL LRG LVL3 (GOWN DISPOSABLE) ×3 IMPLANT
GOWN STRL REUS W/TWL XL LVL3 (GOWN DISPOSABLE) ×9 IMPLANT
HANDLE STAPLE EGIA 4 XL (STAPLE) ×3 IMPLANT
KIT BASIN OR (CUSTOM PROCEDURE TRAY) ×3 IMPLANT
KIT GASTRIC LAVAGE 34FR ADT (SET/KITS/TRAYS/PACK) ×3 IMPLANT
KIT TURNOVER KIT A (KITS) IMPLANT
MARKER SKIN DUAL TIP RULER LAB (MISCELLANEOUS) ×3 IMPLANT
NDL SPNL 22GX3.5 QUINCKE BK (NEEDLE) ×1 IMPLANT
NEEDLE SPNL 22GX3.5 QUINCKE BK (NEEDLE) ×3 IMPLANT
NS IRRIG 1000ML POUR BTL (IV SOLUTION) ×3 IMPLANT
PAD POSITIONING PINK XL (MISCELLANEOUS) IMPLANT
PENCIL SMOKE EVACUATOR (MISCELLANEOUS) IMPLANT
PLUG CATH AND CAP STER (CATHETERS) ×2 IMPLANT
PROTECTOR NERVE ULNAR (MISCELLANEOUS) IMPLANT
RELOAD EGIA 45 MED/THCK PURPLE (STAPLE) IMPLANT
RELOAD EGIA 45 TAN VASC (STAPLE) IMPLANT
RELOAD EGIA 60 MED/THCK PURPLE (STAPLE) IMPLANT
RELOAD EGIA 60 TAN VASC (STAPLE) IMPLANT
RELOAD ENDO STITCH 2.0 (ENDOMECHANICALS)
RELOAD STAPLE 60 MED/THCK ART (STAPLE) IMPLANT
RELOAD SUT SNGL STCH ABSRB 2-0 (ENDOMECHANICALS) IMPLANT
RELOAD SUT SNGL STCH BLK 2-0 (ENDOMECHANICALS) IMPLANT
SCISSORS LAP 5X35 DISP (ENDOMECHANICALS) ×3 IMPLANT
SEALANT SURGICAL APPL DUAL CAN (MISCELLANEOUS) ×3 IMPLANT
SET IRRIG TUBING LAPAROSCOPIC (IRRIGATION / IRRIGATOR) ×3 IMPLANT
SET TUBE SMOKE EVAC HIGH FLOW (TUBING) ×3 IMPLANT
SHEARS HARMONIC ACE PLUS 45CM (MISCELLANEOUS) ×3 IMPLANT
SLEEVE ADV FIXATION 12X100MM (TROCAR) IMPLANT
SLEEVE ADV FIXATION 5X100MM (TROCAR) IMPLANT
SOL ANTI FOG 6CC (MISCELLANEOUS) ×1 IMPLANT
SOLUTION ANTI FOG 6CC (MISCELLANEOUS) ×2
SPONGE DRAIN TRACH 4X4 STRL 2S (GAUZE/BANDAGES/DRESSINGS) ×2 IMPLANT
SPONGE GAUZE 2X2 STER 10/PKG (GAUZE/BANDAGES/DRESSINGS) ×2
STAPLER VISISTAT 35W (STAPLE) ×3 IMPLANT
STRIP CLOSURE SKIN 1/2X4 (GAUZE/BANDAGES/DRESSINGS) IMPLANT
SUT ETHILON 2 0 PS N (SUTURE) ×2 IMPLANT
SUT NOVA NAB GS-21 0 18 T12 DT (SUTURE) ×4 IMPLANT
SUT RELOAD ENDO STITCH 2 48X1 (ENDOMECHANICALS)
SUT RELOAD ENDO STITCH 2.0 (ENDOMECHANICALS)
SUT VIC AB 2-0 SH 18 (SUTURE) ×2 IMPLANT
SUT VIC AB 2-0 SH 27 (SUTURE) ×3
SUT VIC AB 2-0 SH 27X BRD (SUTURE) ×1 IMPLANT
SUT VIC AB 4-0 SH 18 (SUTURE) ×3 IMPLANT
SUTURE RELOAD END STTCH 2 48X1 (ENDOMECHANICALS) IMPLANT
SUTURE RELOAD ENDO STITCH 2.0 (ENDOMECHANICALS) IMPLANT
SYR 20ML LL LF (SYRINGE) ×3 IMPLANT
SYR 30ML LL (SYRINGE) ×3 IMPLANT
SYR 50ML LL SCALE MARK (SYRINGE) ×3 IMPLANT
TAPE CLOTH 4X10 WHT NS (GAUZE/BANDAGES/DRESSINGS) IMPLANT
TRAY FOLEY MTR SLVR 16FR STAT (SET/KITS/TRAYS/PACK) ×3 IMPLANT
TRAY LAPAROSCOPIC (CUSTOM PROCEDURE TRAY) ×3 IMPLANT
TROCAR ADV FIXATION 12X100MM (TROCAR) IMPLANT
TROCAR BLADELESS OPT 5 100 (ENDOMECHANICALS) ×3 IMPLANT
TROCAR XCEL 12X100 BLDLESS (ENDOMECHANICALS) ×3 IMPLANT
TUBING CONNECTING 10 (TUBING) ×2 IMPLANT
TUBING CONNECTING 10' (TUBING) ×1
TUBING ENDO SMARTCAP (MISCELLANEOUS) ×3 IMPLANT
YANKAUER SUCT BULB TIP NO VENT (SUCTIONS) ×3 IMPLANT

## 2019-08-09 NOTE — Anesthesia Postprocedure Evaluation (Signed)
Anesthesia Post Note  Patient: [name]  Procedure(s) Performed: LAPAROSCOPIC FEEDING JEJUNOSTOMY (N/A Abdomen)     Patient location during evaluation: PACU Anesthesia Type: General Level of consciousness: awake Pain management: pain level controlled Vital Signs Assessment: post-procedure vital signs reviewed and stable Respiratory status: spontaneous breathing, nonlabored ventilation, respiratory function stable and patient connected to nasal cannula oxygen Cardiovascular status: blood pressure returned to baseline and stable Postop Assessment: no apparent nausea or vomiting Anesthetic complications: no   No complications documented.  Last Vitals:  Vitals:   08/09/19 1801 08/09/19 1834  BP: 117/75 112/71  Pulse: 71 72  Resp: 16 15  Temp: 36.7 C 36.7 C  SpO2: 99% 100%    Last Pain:  Vitals:   08/09/19 1930  TempSrc:   PainSc: 7                  Yumi Insalaco P Hannelore Bova

## 2019-08-09 NOTE — Progress Notes (Signed)
This nurse made patient aware of delay and that an emergency case was being placed in front of her. Pt expressed understanding. Given warm blankets and turned lights down.

## 2019-08-09 NOTE — Transfer of Care (Signed)
Immediate Anesthesia Transfer of Care Note  Patient: [name]  Procedure(s) Performed: Procedure(s): LAPAROSCOPIC FEEDING JEJUNOSTOMY (N/A)  Patient Location: PACU  Anesthesia Type:General  Level of Consciousness:  sedated, patient cooperative and responds to stimulation  Airway & Oxygen Therapy:Patient Spontanous Breathing and Patient connected to face mask oxgen  Post-op Assessment:  Report given to PACU RN and Post -op Vital signs reviewed and stable  Post vital signs:  Reviewed and stable  Last Vitals:  Vitals:   08/09/19 0652  BP: 120/83  Resp: 16  Temp: 37.1 C  SpO2: 94%    Complications: No apparent anesthesia complications

## 2019-08-09 NOTE — Op Note (Signed)
[name]  Feb 24, 1970   08/09/2019    PCP:  Cyndi Bender, PA-C   Surgeon: Kaylyn Lim, MD, FACS  Asst:  none  Anes:  general  Preop Dx: malnutrition Postop Dx: Same post feeding jejunostomy  Procedure: Laparoscopic assisted feeding jejunostomy Location Surgery: [facility] 1 Complications: None noted  EBL:   minimal cc  Drains: none  Description of Procedure:  The patient was taken to OR 1 .  After anesthesia was administered and the patient was prepped  with choloroprep  and a timeout was performed.  Access to the abdomen was achieved with a 5 mm Optiview through the left upper quadrant.  There were some adhesions from her previous gastrectomy which I took down with sharp dissection.  I placed a 5 mm obliquely in the left lower quadrant about the level of the umbilicus and another one on the right side and one in the midline.  I went ahead and identified the ligament of Treitz.  I measured out about 20cm and oriented the bowel with a grasper.  Next I made an incision in the midline where she had a previous gastrectomy and brought this up.  I brought the 18 a red Robinson through the skin and the tract from the left-sided 5 mm.  I passed it into the small bowel after cutting an extra hole in it and then with sold it.  I tied it with a pursestring and then created a Witzel canal.  This was then sutured to the anterior abdominal wall with 3 sutures of 3-0 Vicryl.  I then closed the midline incision with inverted simple sutures of #1 Novafil.  I injected Exparel in the fascial region and then reinflated the laparoscope and viewed the feeding jejunostomy from within and everything appeared to be in order.  The patient tolerated the procedure well and was taken to the PACU in stable condition.     [name] Done, [place], [facility], [place]

## 2019-08-09 NOTE — Anesthesia Procedure Notes (Signed)
Procedure Name: Intubation Date/Time: 08/09/2019 1:00 PM Performed by: Lavina Hamman, CRNA Pre-anesthesia Checklist: Patient identified, Emergency Drugs available, Suction available, Patient being monitored and Timeout performed Patient Re-evaluated:Patient Re-evaluated prior to induction Oxygen Delivery Method: Circle system utilized Preoxygenation: Pre-oxygenation with 100% oxygen Induction Type: IV induction Ventilation: Mask ventilation without difficulty Laryngoscope Size: Mac and 3 Grade View: Grade I Tube type: Oral Tube size: 7.0 mm Number of attempts: 1 Airway Equipment and Method: Stylet Placement Confirmation: ETT inserted through vocal cords under direct vision,  positive ETCO2,  CO2 detector and breath sounds checked- equal and bilateral Secured at: 21 cm Tube secured with: Tape Dental Injury: Teeth and Oropharynx as per pre-operative assessment

## 2019-08-09 NOTE — Interval H&P Note (Signed)
History and Physical Interval Note:  08/09/2019 12:10 PM  [name]  has presented today for surgery, with the diagnosis of MALNUTRITION.  The various methods of treatment have been discussed with the patient and family. After consideration of risks, benefits and other options for treatment, the patient has consented to  Procedure(s): LAPAROSCOPIC FEEDING JEJUNOSTOMY (N/A) as a surgical intervention.  The patient's history has been reviewed, patient examined, no change in status, stable for surgery.  I have reviewed the patient's chart and labs.  Questions were answered to the patient's satisfaction.     Pedro Earls

## 2019-08-09 NOTE — H&P (Signed)
Chief Complaint: Gastric outlet obstruction with prior antrectomy and B1 anastomosis performed in [place] last year  History of Present Illness:  [name] is an 49 y.o. 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  She underwent a vagotomy antrectomy and a B1 by Dr. Carmell Austria in [facility] last year.  Despite that she continues to take NSAIDs and she smokes.  She is continued to lose weight and would qualify is malnourished at this time.  She was referred to me for feeding jejunostomy.  I obtained a copy of her operative note before moving ahead with planning to do a lap assisted or open jejunostomy since she has had a previous open gastric surgery.  She is aware of the risk and will try to begin feedings as soon as possible.  Past Medical History:  Diagnosis Date  . Allergic rhinitis   . Anemia   . Anxiety disorder   . Aspiration into airway    per pt hx on 08/07/19 aspirates food into lungs when asleep   . Calcaneus fracture, right 06/19/2017  . Chronic interstitial cystitis   . Chronic pain 06/19/2017  . COPD (chronic obstructive pulmonary disease) ([place])   . Depression   . Fibromyalgia   . Gastric outlet obstruction   . Gastric ulcer   . GERD (gastroesophageal reflux disease)   . Hiatal hernia   . History of kidney stones   . Hypothyroidism    in the past  . IBS (irritable bowel syndrome)   . IDA (iron deficiency anemia)   . Internal hemorrhoids   . Kidney stones   . Myalgia   . OCD (obsessive compulsive disorder)   . OCD (obsessive compulsive disorder)   . Ulcerative esophagitis   . Vitamin D deficiency     Past Surgical History:  Procedure Laterality Date  . [place] STUDY N/A 03/15/2019   Procedure: [place] STUDY;  Surgeon: Jerene Bears, MD;  [facility];  Service: Gastroenterology;  Laterality: N/A;  . ABDOMINAL HYSTERECTOMY    . [place] I PROCEDURE  06/22/2016   Dr  Pauletta Browns---- due to gastric ulcer  . ESOPHAGEAL MANOMETRY N/A 03/15/2019   Procedure: ESOPHAGEAL MANOMETRY (EM);  Surgeon: Jerene Bears, MD;  [facility];  Service: Gastroenterology;  Laterality: N/A;  . EXTERNAL EAR SURGERY     dont know specific ear sugery (inner/external)  . FRACTURE SURGERY     right arm  . gastrectomy and bilateral vagotomy     . mutliple surgeries due to intertitial cystitis     bladder stimulator  . ORIF TIBIA FRACTURE Right 06/17/2017   Procedure: OPEN REDUCTION INTERNAL FIXATION (ORIF) DISTAL TIBIA FRACTURE AND CALCANEOUS;  Surgeon: Altamese [place], MD;  Location: [facility];  Service: Orthopedics;  Laterality: Right;  . OTHER SURGICAL HISTORY     TENS implantation for Chronic Interstitial Cystitis    Current Facility-Administered Medications  Medication Dose Route Frequency Provider Last Rate Last Admin  . bupivacaine liposome (EXPAREL) 1.3 % injection 266 mg  20 mL Infiltration Once Johnathan Hausen, MD      . ceFAZolin (ANCEF) IVPB 2g/100 mL premix  2 g Intravenous On Call to OR Johnathan Hausen, MD      . Chlorhexidine Gluconate Cloth 2 % PADS 6 each  6 each Topical Once Johnathan Hausen, MD       And  . Chlorhexidine Gluconate Cloth 2 % PADS 6  each  6 each Topical Once Johnathan Hausen, MD      . lactated ringers infusion   Intravenous Continuous Albertha Ghee, MD 10 mL/hr at 08/09/19 0657 New Bag at 08/09/19 0657   Sulfamethoxazole, Morphine and related, and Xanax xr [alprazolam er] Family History  Problem Relation Age of Onset  . Clotting disorder Mother   . Crohn's disease Mother   . Heart disease Mother   . Emphysema Mother   . Heart disease Father   . Breast cancer Maternal Aunt   . Colon cancer Neg Hx   . Stomach cancer Neg Hx   . Rectal cancer Neg Hx   . Esophageal cancer Neg Hx    Social History:   reports that she has been smoking cigarettes. She has been smoking about 0.50 packs per day. She has never used smokeless tobacco. She reports  previous alcohol use. She reports that she does not use drugs.   REVIEW OF SYSTEMS : Negative except for see her extensive problem list  Physical Exam:   Blood pressure 120/83, temperature 98.8 F (37.1 C), temperature source Oral, resp. rate 16, height 4' 11.5" (1.511 m), weight (!) 37.6 kg, SpO2 96 %. Body mass index is 16.44 kg/m.  Gen:  WDWN extremely thin white female NAD  Neurological: Alert and oriented to person, place, and time. Motor and sensory function is grossly intact  Head: Normocephalic and atraumatic.  Eyes: Conjunctivae are normal. Pupils are equal, round, and reactive to light. No scleral icterus.  Neck: Normal range of motion. Neck supple. No tracheal deviation or thyromegaly present.  Cardiovascular:  SR without murmurs or gallops.  No carotid bruits Breast: Not examined Respiratory: Effort normal.  No respiratory distress. No chest wall tenderness. Breath sounds normal.  No wheezes, rales or rhonchi.  Abdomen: Flat with a midline incision GU: Not examined Musculoskeletal: Normal range of motion. Extremities are nontender. No cyanosis, edema or clubbing noted Lymphadenopathy: No cervical, preauricular, postauricular or axillary adenopathy is present Skin: Skin is warm and dry. No rash noted. No diaphoresis. No erythema. No pallor. Pscyh: Normal mood and affect. Behavior is normal. Judgment and thought content normal.   LABORATORY RESULTS: Results for orders placed or performed during the hospital encounter of 08/09/19 (from the past 48 hour(s))  CBC per protocol     Status: Abnormal   Collection Time: 08/09/19  6:50 AM  Result Value Ref Range   WBC 7.9 4.0 - 10.5 K/uL   RBC 3.77 (L) 3.87 - 5.11 MIL/uL   Hemoglobin 11.5 (L) 12.0 - 15.0 g/dL   HCT 38.2 36 - 46 %   MCV 101.3 (H) 80.0 - 100.0 fL   MCH 30.5 26.0 - 34.0 pg   MCHC 30.1 30.0 - 36.0 g/dL   RDW 16.5 (H) 11.5 - 15.5 %   Platelets 239 150 - 400 K/uL   nRBC 0.0 0.0 - 0.2 %    Comment: Performed at  [facility], [place] [address] [place], [place] [address]     RADIOLOGY RESULTS: No results found.  Problem List: Patient Active Problem List   Diagnosis Date Noted  . Nausea and vomiting   . Regurgitation of food   . Aperistalsis of esophagus   . Calcaneus fracture, right 06/19/2017  . Chronic pain 06/19/2017  . Closed fracture of right distal tibia 06/17/2017  . Anemia 09/05/2015  . GERD (gastroesophageal reflux disease) 09/05/2015  . IBS (irritable bowel syndrome) 09/05/2015  . Depression 09/05/2015  . Chronic interstitial  cystitis 09/05/2015  . Iron deficiency anemia 09/05/2015    Assessment & Plan: Gastric outlet obstruction from smoking and NSAID use.  Will proceed with feeding jejunostomy.    [name] Done, MD, [facility] [phone] beeper [phone]  08/09/2019 8:12 AM

## 2019-08-10 ENCOUNTER — Other Ambulatory Visit: Payer: Self-pay

## 2019-08-10 MED ORDER — QUETIAPINE FUMARATE 50 MG PO TABS
200.0000 mg | ORAL_TABLET | Freq: Once | ORAL | Status: AC
  Administered 2019-08-10: 200 mg via ORAL
  Filled 2019-08-10: qty 8

## 2019-08-10 MED ORDER — FENTANYL CITRATE (PF) 100 MCG/2ML IJ SOLN
12.5000 ug | INTRAMUSCULAR | Status: DC | PRN
  Administered 2019-08-10 – 2019-08-11 (×7): 12.5 ug via INTRAVENOUS
  Filled 2019-08-10 (×7): qty 2

## 2019-08-10 NOTE — Plan of Care (Signed)
Plan of care reviewed and discussed with the patient. 

## 2019-08-10 NOTE — Progress Notes (Addendum)
Verbal order from MD Marlou Starks for Seroquel 200mg  PO one time dose.

## 2019-08-11 ENCOUNTER — Encounter (HOSPITAL_COMMUNITY): Payer: Self-pay | Admitting: Surgery

## 2019-08-11 DIAGNOSIS — E43 Unspecified severe protein-calorie malnutrition: Secondary | ICD-10-CM | POA: Insufficient documentation

## 2019-08-11 LAB — MAGNESIUM
Magnesium: 1.8 mg/dL (ref 1.7–2.4)
Magnesium: 1.9 mg/dL (ref 1.7–2.4)

## 2019-08-11 LAB — PHOSPHORUS
Phosphorus: 3.4 mg/dL (ref 2.5–4.6)
Phosphorus: 3.6 mg/dL (ref 2.5–4.6)

## 2019-08-11 MED ORDER — FREE WATER
100.0000 mL | Status: DC
  Administered 2019-08-11 – 2019-08-12 (×6): 100 mL

## 2019-08-11 MED ORDER — TRAMADOL HCL 50 MG PO TABS
50.0000 mg | ORAL_TABLET | Freq: Four times a day (QID) | ORAL | Status: DC | PRN
  Administered 2019-08-11 – 2019-08-12 (×4): 50 mg via ORAL
  Filled 2019-08-11 (×4): qty 1

## 2019-08-11 MED ORDER — QUETIAPINE FUMARATE 50 MG PO TABS
200.0000 mg | ORAL_TABLET | Freq: Once | ORAL | Status: AC
  Administered 2019-08-11: 200 mg via ORAL
  Filled 2019-08-11: qty 4

## 2019-08-11 MED ORDER — OSMOLITE 1.5 CAL PO LIQD
1000.0000 mL | ORAL | Status: DC
  Administered 2019-08-11: 1000 mL
  Filled 2019-08-11 (×2): qty 1000

## 2019-08-11 NOTE — Progress Notes (Signed)
Verbal order by MD Dema Severin for Seroquel 200mg  PO.

## 2019-08-11 NOTE — TOC Initial Note (Addendum)
Transition of Care [facility]) - Initial/Assessment Note    Patient Details  Name: [name] MRN: [id number] Date of Birth: May 15, 1970  [facility] CM/SW Contact:    Lia Hopping, [place] Phone Number: 08/11/2019, 4:42 PM  Clinical Narrative:                 Patient will discharge home on Tube feedings. Physician placed an order for feedings and supplies. Referral made to [place].  HHRN through [facility] arranged start of Care, Monday.  Patient agreeable to discharge plan.    Expected Discharge Plan: [place] Barriers to Discharge: No Barriers Identified   Patient Goals and CMS Choice Patient states their goals for this hospitalization and ongoing recovery are:: to return home CMS Medicare.gov Compare Post Acute Care list provided to:: Patient Choice offered to / list presented to : Patient  Expected Discharge Plan and Services Expected Discharge Plan: [place] In-house Referral: Clinical Social Work Discharge Planning Services: CM Consult Post Acute Care Choice: [place] arrangements for the past 2 months: Apartment                 DME Arranged: Tube feeding pump, Tube feeding DME Agency: [employer] Date DME Agency Contacted: 08/11/19 Time DME Agency Contacted: [phone] Representative spoke with at DME Agency: [place] Arranged: RN [place] Agency: [place] Date [place]: 08/11/19 Time [place]: 1640 Representative spoke with at [place]: [place] Arrangements/Services Living arrangements for the past 2 months: [place] with:: Self   Do you feel safe going back to the place where you live?: Yes      Need for Family Participation in Patient Care: Yes (Comment) Care giver support system in place?: Yes (comment)      Activities of Daily Living Home Assistive Devices/Equipment: None ADL Screening (condition at time of admission) Patient's cognitive  ability adequate to safely complete daily activities?: Yes Is the patient deaf or have difficulty hearing?: No Does the patient have difficulty seeing, even when wearing glasses/contacts?: No Does the patient have difficulty concentrating, remembering, or making decisions?: No Patient able to express need for assistance with ADLs?: Yes Does the patient have difficulty dressing or bathing?: No Independently performs ADLs?: Yes (appropriate for developmental age) Does the patient have difficulty walking or climbing stairs?: No Weakness of Legs: None Weakness of Arms/Hands: None  Permission Sought/Granted Permission sought to share information with : Case Manager Permission granted to share information with : Yes, Verbal Permission Granted     Permission granted to share info w AGENCY: Home Health Agency        Emotional Assessment Appearance:: Appears stated age Attitude/Demeanor/Rapport: Engaged Affect (typically observed): Accepting Orientation: : Oriented to Self, Oriented to Place, Oriented to  Time, Oriented to Situation Alcohol / Substance Use: Not Applicable Psych Involvement: No (comment)  Admission diagnosis:  Malnutrition ([place]) [E46] Patient Active Problem List   Diagnosis Date Noted  . Protein-calorie malnutrition, severe 08/11/2019  . Malnutrition ([place]) 08/09/2019  . Nausea and vomiting   . Regurgitation of food   . Aperistalsis of esophagus   . Calcaneus fracture, right 06/19/2017  . Chronic pain 06/19/2017  . Closed fracture of right distal tibia 06/17/2017  . Anemia 09/05/2015  . GERD (gastroesophageal reflux disease) 09/05/2015  . IBS (irritable bowel syndrome) 09/05/2015  . Depression 09/05/2015  . Chronic interstitial cystitis 09/05/2015  . Iron deficiency anemia 09/05/2015  PCP:  Cyndi Bender, PA-C Pharmacy:   [id number] - [place], [place] [address] Phone:  [phone] Fax: [phone]     Social Determinants of Health (SDOH) Interventions    Readmission Risk Interventions No flowsheet data found.

## 2019-08-11 NOTE — Progress Notes (Signed)
Patient ID: [name], female   DOB: 07/03/1970, 49 y.o.   MRN: [id number] [facility] Surgery Progress Note:   2 Days Post-Op  Subjective: Mental status is slow and complaining of pain.  Complaints pain and wanting her home meds. Objective: Vital signs in last 24 hours: Temp:  [98 F (36.7 C)-98.6 F (37 C)] 98.6 F (37 C) (07/30 1335) Pulse Rate:  [75-82] 76 (07/30 1335) Resp:  [13-18] 18 (07/30 1335) BP: (120-131)/(76-89) 120/85 (07/30 1335) SpO2:  [95 %] 95 % (07/30 1335)  Intake/Output from previous day: 07/29 0701 - 07/30 0700 In: 2152.5 [P.O.:420; I.V.:1732.5] Out: 1300 [Urine:1300] Intake/Output this shift: Total I/O In: 778.7 [P.O.:120; I.V.:599.9; NG/GT:58.8] Out: 825 [Urine:825]  Physical Exam: Work of breathing is normal.  She is frail and emaciated.  J tube is secure but I want to wait and start TF tomorrow and make sure that she can tolerate before discharging.    Lab Results:  Results for orders placed or performed during the hospital encounter of 08/09/19 (from the past 48 hour(s))  Magnesium     Status: None   Collection Time: 08/11/19 10:49 AM  Result Value Ref Range   Magnesium 1.8 1.7 - 2.4 mg/dL    Comment: Performed at [facility], [place] [address], [place], [place] [address]  Phosphorus     Status: None   Collection Time: 08/11/19 10:49 AM  Result Value Ref Range   Phosphorus 3.4 2.5 - 4.6 mg/dL    Comment: Performed at [facility], [place] [address] [place], [place] [address]    Radiology/Results: No results found.  Anti-infectives: Anti-infectives (From admission, onward)   Start     Dose/Rate Route Frequency Ordered Stop   08/09/19 1830  ceFAZolin (ANCEF) IVPB 2g/100 mL premix        2 g 200 mL/hr over 30 Minutes Intravenous Every 8 hours 08/09/19 1543 08/09/19 1800   08/09/19 0645  ceFAZolin (ANCEF) IVPB 2g/100 mL premix        2 g 200 mL/hr over 30 Minutes Intravenous On call to O.R. 08/09/19  0630 08/09/19 1231      Assessment/Plan: Problem List: Patient Active Problem List   Diagnosis Date Noted  . Protein-calorie malnutrition, severe 08/11/2019  . Malnutrition ([place]) 08/09/2019  . Nausea and vomiting   . Regurgitation of food   . Aperistalsis of esophagus   . Calcaneus fracture, right 06/19/2017  . Chronic pain 06/19/2017  . Closed fracture of right distal tibia 06/17/2017  . Anemia 09/05/2015  . GERD (gastroesophageal reflux disease) 09/05/2015  . IBS (irritable bowel syndrome) 09/05/2015  . Depression 09/05/2015  . Chronic interstitial cystitis 09/05/2015  . Iron deficiency anemia 09/05/2015    Pain control is important but because of her malnutrition, we need to have her started on TF and arrange DME for home.  Anticipate discharge on Saturday.   2 Days Post-Op    LOS: 0 days   [name] Done, MD, [facility] [phone] to reach the surgeon on call.    08/11/2019 4:40 PM

## 2019-08-11 NOTE — Progress Notes (Addendum)
Initial Nutrition Assessment  DOCUMENTATION CODES:   Severe malnutrition in context of chronic illness, Underweight  INTERVENTION:   Monitor magnesium, potassium, and phosphorus daily for at least 3 days, MD to replete as needed, as pt is at risk for refeeding syndrome.  -Initiate Osmolite 1.5 @ 25 ml/hr via J-tube. Advance by 10 ml every 24 hours to goal rate of 50 ml/hr.  -Per MD, free water of 100 ml every 4 hours (600 ml) -This regimen provides 1800 kcals, 75g protein and 1514 ml H2O.  NUTRITION DIAGNOSIS:   Severe Malnutrition related to altered GI function (s/p vagotomy antrectomy) as evidenced by energy intake < or equal to 75% for > or equal to 1 month, severe muscle depletion, severe fat depletion.  GOAL:   Patient will meet greater than or equal to 90% of their needs  MONITOR:   Weight trends, TF tolerance, Skin, I & O's, Labs  REASON FOR ASSESSMENT:   Consult Assessment of nutrition requirement/status, Enteral/tube feeding initiation and management  ASSESSMENT:   49 y.o. 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  She underwent a vagotomy antrectomy and a B1 by Dr. Carmell Austria in [facility] last year.  Despite that she continues to take NSAIDs and she smokes.  She is continued to lose weight and would qualify is malnourished at this time. Admitted for jejunostomy placement.  7/28: s/p LAPAROSCOPIC FEEDING JEJUNOSTOMY   Per RN, MD would like pt's TF to begin at 25 ml/hr via J-tube. Requested free water of 100 ml every 4 hours.  RD placed orders and provided goal rate above.   Pt reports not eating well and mainly consuming meals at night which have caused some issues overnight with aspiration. Pt states she was having N/V for months. Pt with severe esophagitis. Per care everywhere, pt was having similar symptoms in December 2020.  Recommend slow advancement given prolonged poor  nutrition and malnutrition.  Per weight records, pt's weight has remained in the underweight status range. Current weight is 82 lbs.   Medications: D5 infusion, IV Zofran Labs reviewed: Mg/Phos WNL  NUTRITION - FOCUSED PHYSICAL EXAM:    Most Recent Value  Orbital Region Mild depletion  Upper Arm Region Severe depletion  Thoracic and Lumbar Region Unable to assess  Buccal Region Moderate depletion  Temple Region Moderate depletion  Clavicle Bone Region Severe depletion  Clavicle and Acromion Bone Region Severe depletion  Scapular Bone Region Severe depletion  Dorsal Hand Moderate depletion  Patellar Region Unable to assess  Anterior Thigh Region Unable to assess  Posterior Calf Region Unable to assess  Edema (RD Assessment) None  Hair Reviewed  Eyes Reviewed  Mouth Reviewed  Skin Reviewed       Diet Order:   Diet Order            Diet clear liquid Room service appropriate? Yes; Fluid consistency: Thin  Diet effective now                 EDUCATION NEEDS:   Education needs have been addressed  Skin:  Skin Assessment: Reviewed RN Assessment  Last BM:  7/27  Height:   Ht Readings from Last 1 Encounters:  08/09/19 4' 11.5" (1.511 m)    Weight:   Wt Readings from Last 1 Encounters:  08/09/19 (!) 37.6 kg    BMI:  Body mass index is 16.44 kg/m.  Estimated Nutritional Needs:   Kcal:  8299-3716  Protein:  75-85g  Fluid:  Per MD   Clayton Bibles, MS, RD, LDN Inpatient Clinical Dietitian Contact information available via Amion

## 2019-08-11 NOTE — Progress Notes (Signed)
Patient ID: [name], female   DOB: [birth year], 49 y.o.   MRN: [id number] [facility] Surgery Progress Note:   2 Days Post-Op  Subjective: Mental status is clear.  Complaints much less than yesterday. Objective: Vital signs in last 24 hours: Temp:  [98 F (36.7 C)-98.6 F (37 C)] 98.6 F (37 C) (07/30 1335) Pulse Rate:  [75-82] 76 (07/30 1335) Resp:  [13-18] 18 (07/30 1335) BP: (120-131)/(76-89) 120/85 (07/30 1335) SpO2:  [95 %] 95 % (07/30 1335)  Intake/Output from previous day: 07/29 0701 - 07/30 0700 In: 2152.5 [P.O.:420; I.V.:1732.5] Out: 1300 [Urine:1300] Intake/Output this shift: Total I/O In: -  Out: 825 [Urine:825]  Physical Exam: Work of breathing is normal.  Tube feeding at 25--will increase to 50 cc /hr.  Incisions bland  Lab Results:  Results for orders placed or performed during the hospital encounter of 08/09/19 (from the past 48 hour(s))  CBC     Status: Abnormal   Collection Time: 08/09/19  4:12 PM  Result Value Ref Range   WBC 12.8 (H) 4.0 - 10.5 K/uL   RBC 4.19 3.87 - 5.11 MIL/uL   Hemoglobin 13.0 12.0 - 15.0 g/dL   HCT 41.7 36 - 46 %   MCV 99.5 80.0 - 100.0 fL   MCH 31.0 26.0 - 34.0 pg   MCHC 31.2 30.0 - 36.0 g/dL   RDW 16.5 (H) 11.5 - 15.5 %   Platelets 243 150 - 400 K/uL   nRBC 0.0 0.0 - 0.2 %    Comment: Performed at [facility], [place] [address] [place], [place] [address]  Creatinine, serum     Status: None   Collection Time: 08/09/19  4:12 PM  Result Value Ref Range   Creatinine, Ser 0.72 0.44 - 1.00 mg/dL   GFR calc non Af Amer >60 >60 mL/min   GFR calc Af Amer >60 >60 mL/min    Comment: Performed at [facility], [place] [address], [place], [place] [address]  Magnesium     Status: None   Collection Time: 08/11/19 10:49 AM  Result Value Ref Range   Magnesium 1.8 1.7 - 2.4 mg/dL    Comment: Performed at [facility], [place] [address] [place], [place] [address]  Phosphorus      Status: None   Collection Time: 08/11/19 10:49 AM  Result Value Ref Range   Phosphorus 3.4 2.5 - 4.6 mg/dL    Comment: Performed at [facility], [place] [address], [place], [place] [address]    Radiology/Results: No results found.  Anti-infectives: Anti-infectives (From admission, onward)   Start     Dose/Rate Route Frequency Ordered Stop   08/09/19 1830  ceFAZolin (ANCEF) IVPB 2g/100 mL premix        2 g 200 mL/hr over 30 Minutes Intravenous Every 8 hours 08/09/19 1543 08/09/19 1800   08/09/19 0645  ceFAZolin (ANCEF) IVPB 2g/100 mL premix        2 g 200 mL/hr over 30 Minutes Intravenous On call to O.R. 08/09/19 0630 08/09/19 1231      Assessment/Plan: Problem List: Patient Active Problem List   Diagnosis Date Noted  . Protein-calorie malnutrition, severe 08/11/2019  . Malnutrition ([place]) 08/09/2019  . Nausea and vomiting   . Regurgitation of food   . Aperistalsis of esophagus   . Calcaneus fracture, right 06/19/2017  . Chronic pain 06/19/2017  . Closed fracture of right distal tibia 06/17/2017  . Anemia 09/05/2015  . GERD (gastroesophageal  reflux disease) 09/05/2015  . IBS (irritable bowel syndrome) 09/05/2015  . Depression 09/05/2015  . Chronic interstitial cystitis 09/05/2015  . Iron deficiency anemia 09/05/2015    She is not ready for discharge this PM.  Will get durable med equip for pump to deliver osmolite at home.   2 Days Post-Op    LOS: 0 days   [name] Done, MD, [facility] [phone] to reach the surgeon on call.    08/11/2019 3:48 PM

## 2019-08-12 LAB — GLUCOSE, CAPILLARY
Glucose-Capillary: 138 mg/dL — ABNORMAL HIGH (ref 70–99)
Glucose-Capillary: 131 mg/dL — ABNORMAL HIGH (ref 70–99)
Glucose-Capillary: 113 mg/dL — ABNORMAL HIGH (ref 70–99)

## 2019-08-12 LAB — MAGNESIUM: Magnesium: 2 mg/dL (ref 1.7–2.4)

## 2019-08-12 LAB — PHOSPHORUS: Phosphorus: 3.1 mg/dL (ref 2.5–4.6)

## 2019-08-12 MED ORDER — ALUM & MAG HYDROXIDE-SIMETH 200-200-20 MG/5ML PO SUSP
30.0000 mL | Freq: Four times a day (QID) | ORAL | Status: DC | PRN
  Administered 2019-08-12: 30 mL via ORAL
  Filled 2019-08-12: qty 30

## 2019-08-12 NOTE — Progress Notes (Signed)
Nurse reviewed discharge instructions with pt.  Pt verbalized understanding of discharge instructions and follow up appointment.  Pt and family taught how to flush j-tube with tap water. Abd dressings removed prior to discharge, staples in place under dressings.

## 2019-08-12 NOTE — Progress Notes (Signed)
Subjective No acute events. Feeling well. Tolerating j tube feeds without issue. Also tolerating liquids and pills by mouth. Denies pain, n/v.  Objective: Vital signs in last 24 hours: Temp:  [98 F (36.7 C)-98.6 F (37 C)] 98.2 F (36.8 C) (07/31 0825) Pulse Rate:  [72-83] 83 (07/31 0825) Resp:  [14-18] 14 (07/31 0825) BP: (118-149)/(75-92) 149/92 (07/31 0825) SpO2:  [92 %-95 %] 94 % (07/31 0825) Weight:  [38.2 kg] 38.2 kg (07/31 0700) Last BM Date: 08/08/19  Intake/Output from previous day: 07/30 0701 - 07/31 0700 In: 3002.4 [P.O.:480; I.V.:1763.6; NG/GT:758.8] Out: 1925 [Urine:1925] Intake/Output this shift: No intake/output data recorded.  Gen: NAD, comfortable CV: RRR Pulm: Normal work of breathing Abd: Soft, NT/ND; sites clean Ext: SCDs in place  Lab Results: CBC  Recent Labs    08/09/19 1612  WBC 12.8*  HGB 13.0  HCT 41.7  PLT 243   BMET Recent Labs    08/09/19 1612  CREATININE 0.72   PT/INR No results for input(s): LABPROT, INR in the last 72 hours. ABG No results for input(s): PHART, HCO3 in the last 72 hours.  Invalid input(s): PCO2, PO2  Studies/Results:  Anti-infectives: Anti-infectives (From admission, onward)   Start     Dose/Rate Route Frequency Ordered Stop   08/09/19 1830  ceFAZolin (ANCEF) IVPB 2g/100 mL premix        2 g 200 mL/hr over 30 Minutes Intravenous Every 8 hours 08/09/19 1543 08/09/19 1800   08/09/19 0645  ceFAZolin (ANCEF) IVPB 2g/100 mL premix        2 g 200 mL/hr over 30 Minutes Intravenous On call to O.R. 08/09/19 0630 08/09/19 1231       Assessment/Plan: Patient Active Problem List   Diagnosis Date Noted  . Protein-calorie malnutrition, severe 08/11/2019  . Malnutrition ([place]) 08/09/2019  . Nausea and vomiting   . Regurgitation of food   . Aperistalsis of esophagus   . Calcaneus fracture, right 06/19/2017  . Chronic pain 06/19/2017  . Closed fracture of right distal tibia 06/17/2017  . Anemia 09/05/2015  .  GERD (gastroesophageal reflux disease) 09/05/2015  . IBS (irritable bowel syndrome) 09/05/2015  . Depression 09/05/2015  . Chronic interstitial cystitis 09/05/2015  . Iron deficiency anemia 09/05/2015   s/p Procedure(s): LAPAROSCOPIC FEEDING JEJUNOSTOMY 08/09/2019  -She states she will not stay in house until Monday; she is comfortable managing j-tube; instructed on flushing until home health arrives Monday and can get her set up with home feeding pump and supplies.  -Requesting to go home today   LOS: 0 days   Sharon Mt. Dema Severin, M.D. [facility] Use [email or web address] to contact on call provider

## 2019-08-12 NOTE — Discharge Instructions (Signed)
POST OP INSTRUCTIONS  DIET: As tolerated. Flush tube twice daily with tap water as instructed by nursing.  1. Take your usually prescribed home medications unless otherwise directed.  2. PAIN CONTROL: a. Pain is best controlled by a usual combination of three different methods TOGETHER: i. Ice/Heat ii. Over the counter pain medication iii. Prescription pain medication b. Most patients will experience some swelling and bruising around the surgical site.  Ice packs or heating pads (30-60 minutes up to 6 times a day) will help. Some people prefer to use ice alone, heat alone, alternating between ice & heat.  Experiment to what works for you.  Swelling and bruising can take several weeks to resolve.   c. It is helpful to take an over-the-counter pain medication regularly for the first few weeks: i. Acetaminophen (Tylenol) - you may take 650mg  every 6 hours as needed. You can take this with motrin as they act differently on the body. If you are taking a narcotic pain medication that has acetaminophen in it, do not take over the counter tylenol at the same time.  d. A  prescription for pain medication should be given to you upon discharge.  Take your pain medication as prescribed if your pain is not adequatly controlled with the over-the-counter pain reliefs mentioned above.  3. Avoid getting constipated.  Between the surgery and the pain medications, it is common to experience some constipation.  Increasing fluid intake and taking a fiber supplement (such as Metamucil, Citrucel, FiberCon, MiraLax, etc) 1-2 times a day regularly will usually help prevent this problem from occurring.  A mild laxative (prune juice, Milk of Magnesia, MiraLax, etc) should be taken according to package directions if there are no bowel movements after 48 hours.    4. Dressing: Keep j tube site dry when bathing. Do not submerge under water.  5. ACTIVITIES as tolerated:   a. Avoid heavy lifting (>10lbs or 1 gallon of milk)  for the next 6 weeks. b. You may resume regular (light) daily activities beginning the next day--such as daily self-care, walking, climbing stairs--gradually increasing activities as tolerated.  If you can walk 30 minutes without difficulty, it is safe to try more intense activity such as jogging, treadmill, bicycling, low-impact aerobics.  c. DO NOT PUSH THROUGH PAIN.  Let pain be your guide: If it hurts to do something, don't do it. d. Dennis Bast may drive when you are no longer taking prescription pain medication, you can comfortably wear a seatbelt, and you can safely maneuver your car and apply brakes.  6. FOLLOW UP in our office a. Please call [facility] at [phone] to set up an appointment to see your surgeon in the office for a follow-up appointment approximately 2 weeks after your surgery. b. Make sure that you call for this appointment the day you arrive home to insure a convenient appointment time.  9. If you have disability or family leave forms that need to be completed, you may have them completed by your primary care physician's office; for return to work instructions, please ask our office staff and they will be happy to assist you in obtaining this documentation   When to call us [phone]: 1. Poor pain control 2. Reactions / problems with new medications (rash/itching, etc)  3. Fever over 101.5 F (38.5 C) 4. Inability to urinate 5. Nausea/vomiting 6. Worsening swelling or bruising 7. Continued bleeding from incision. 8. Increased pain, redness, or drainage from the incision  The clinic staff is available  to answer your questions during regular business hours (8:30am-5pm).  Please don't hesitate to call and ask to speak to one of our nurses for clinical concerns.   A surgeon from [facility] is always on call at the hospitals   If you have a medical emergency, go to the nearest emergency room or call 911.  [facility], [place] [address]  [place], [place], [place]  [address] MAIN: [phone] FAX: [phone] [email or web address]

## 2019-08-13 NOTE — Discharge Summary (Signed)
Patient ID: [name] MRN: [id number] DOB/AGE: [birth year] 47 y.o.  Admit date: 08/09/2019 Discharge date: 08/13/2019  Discharge Diagnoses Patient Active Problem List   Diagnosis Date Noted  . Protein-calorie malnutrition, severe 08/11/2019  . Malnutrition ([place]) 08/09/2019  . Nausea and vomiting   . Regurgitation of food   . Aperistalsis of esophagus   . Calcaneus fracture, right 06/19/2017  . Chronic pain 06/19/2017  . Closed fracture of right distal tibia 06/17/2017  . Anemia 09/05/2015  . GERD (gastroesophageal reflux disease) 09/05/2015  . IBS (irritable bowel syndrome) 09/05/2015  . Depression 09/05/2015  . Chronic interstitial cystitis 09/05/2015  . Iron deficiency anemia 09/05/2015    Hospital Course: She was admitted postoperatively where she recovered appropriately. She was taught how to care for, empty/record and troubleshoot her JP drain. She did well. She was tolerating diet. Pain controlled. JP serosanguinous. She requested and felt comfortable with discharge home.   Allergies as of 08/12/2019      Reactions   Sulfamethoxazole Anaphylaxis   Morphine And Related Other (See Comments)   Delirium. Makes me go "crazy"   Xanax Xr [alprazolam Er] Other (See Comments)   Enhanced depression; feels gloomy      Medication List    STOP taking these medications   ascorbic acid 500 MG tablet Commonly known as: VITAMIN C   docusate sodium 100 MG capsule Commonly known as: COLACE   famotidine 20 MG tablet Commonly known as: Pepcid   lubiprostone 24 MCG capsule Commonly known as: AMITIZA     TAKE these medications   acetaminophen 325 MG tablet Commonly known as: TYLENOL Take 1-2 tablets (325-650 mg total) by mouth every 6 (six) hours as needed for mild pain (pain score 1-3 or temp > 100.5).   albuterol 108 (90 Base) MCG/ACT inhaler Commonly known as: VENTOLIN HFA Inhale 1-2 puffs into the lungs every 4 (four) hours as needed for shortness of breath or  wheezing.   BC HEADACHE PO Take 1 packet by mouth daily as needed (pain/headache).   fluconazole 100 MG tablet Commonly known as: Diflucan Take 2 tablets by mouth day 1 then take 1 tablet daily   gabapentin 600 MG tablet Commonly known as: NEURONTIN Take 600 mg by mouth 3 (three) times daily. What changed: Another medication with the same name was removed. Continue taking this medication, and follow the directions you see here.   hydrOXYzine 25 MG tablet Commonly known as: ATARAX/VISTARIL Take 25 mg by mouth at bedtime as needed for sleep.   metoCLOPramide 5 MG tablet Commonly known as: REGLAN TAKE 1 TABLET BY MOUTH 4 TIMES A DAY   pantoprazole 40 MG tablet Commonly known as: PROTONIX Take 1 tablet (40 mg total) by mouth in the morning, at noon, and at bedtime.   polyvinyl alcohol 1.4 % ophthalmic solution Commonly known as: LIQUIFILM TEARS Place 1 drop into both eyes as needed for dry eyes.   promethazine 25 MG tablet Commonly known as: PHENERGAN Take 25 mg by mouth 3 (three) times daily as needed for nausea/vomiting.   QUEtiapine 200 MG tablet Commonly known as: SEROQUEL Take 200 mg by mouth at bedtime.   sertraline 50 MG tablet Commonly known as: ZOLOFT Take 50 mg by mouth daily.   sucralfate 1 GM/10ML suspension Commonly known as: CARAFATE Take 10 mLs (1 g total) by mouth 4 (four) times daily. 3 times daily and at bedtime.   traMADol 50 MG tablet Commonly known as: ULTRAM Take 50-100 mg by mouth every  6 (six) hours as needed for moderate pain.   traZODone 50 MG tablet Commonly known as: DESYREL Take 50 mg by mouth at bedtime.         Follow-up Information    Johnathan Hausen, MD Follow up in 2 week(s).   Specialty: General Surgery Contact information: [place] [address] [phone]        Care, [facility] Follow up.   Specialty: Home Health Services Why: agency will provide home health services. Contact  information: [address] [place] [address] [phone]               Sharon Mt. Dema Severin, M.D. [facility]

## 2019-08-15 ENCOUNTER — Telehealth: Payer: Self-pay | Admitting: Internal Medicine

## 2019-08-15 NOTE — Telephone Encounter (Signed)
Spoke with Nira Conn and let her know that the orders for the tube feedings need to come from the surgeons office as they placed the tube.

## 2019-08-15 NOTE — Telephone Encounter (Signed)
Heather from [place] is calling. states patient is just out of hospital and has a J tube. She states that the hospital did not sign the home health orders and asking if Dr. Hilarie Fredrickson would give a verbal okay since he is her doctor. She is wanting to talk to you about the orders.

## 2019-08-18 ENCOUNTER — Telehealth: Payer: Self-pay | Admitting: Internal Medicine

## 2019-08-18 NOTE — Telephone Encounter (Signed)
Pt states she used to take dulcolax prior to tube placement for constipation and it worked for her. Discussed with home health nurse that pt may resume the dulcolax. Asking if pt needs follow-up with Dr. Hilarie Fredrickson. Scheduled pt to see him 09/06/19@3 :40pm. Message left on nurse voicemail regarding appt as we were disconnected.

## 2019-08-18 NOTE — Telephone Encounter (Signed)
Estill Bamberg from [facility] is requesting a call back from a nurse in regards to her tube feeding and pt has not had a bowel movement in 10 days

## 2019-08-21 LAB — GLUCOSE, CAPILLARY
Glucose-Capillary: 112 mg/dL — ABNORMAL HIGH (ref 70–99)
Glucose-Capillary: 131 mg/dL — ABNORMAL HIGH (ref 70–99)
Glucose-Capillary: 146 mg/dL — ABNORMAL HIGH (ref 70–99)
Glucose-Capillary: 153 mg/dL — ABNORMAL HIGH (ref 70–99)

## 2019-09-06 ENCOUNTER — Ambulatory Visit: Payer: Medicare Other | Admitting: Internal Medicine

## 2019-09-14 DIAGNOSIS — M6282 Rhabdomyolysis: Secondary | ICD-10-CM | POA: Diagnosis not present

## 2019-09-15 ENCOUNTER — Ambulatory Visit (HOSPITAL_COMMUNITY)
Admission: RE | Admit: 2019-09-15 | Discharge: 2019-09-15 | Disposition: A | Discharge status: Home / Self Care | Payer: Medicare Other | Source: Ambulatory Visit | Attending: Interventional Radiology | Admitting: Interventional Radiology

## 2019-09-15 ENCOUNTER — Other Ambulatory Visit (HOSPITAL_COMMUNITY): Payer: Self-pay | Admitting: Interventional Radiology

## 2019-09-15 ENCOUNTER — Other Ambulatory Visit: Payer: Self-pay

## 2019-09-15 DIAGNOSIS — R633 Feeding difficulties, unspecified: Secondary | ICD-10-CM

## 2019-09-15 DIAGNOSIS — K9413 Enterostomy malfunction: Secondary | ICD-10-CM | POA: Insufficient documentation

## 2019-09-15 HISTORY — PX: IR REPLC DUODEN/JEJUNO TUBE PERCUT W/FLUORO: IMG2334

## 2019-09-15 MED ORDER — LIDOCAINE HCL 1 % IJ SOLN
INTRAMUSCULAR | Status: AC
  Filled 2019-09-15: qty 20

## 2019-09-15 MED ORDER — IOHEXOL 300 MG/ML  SOLN
50.0000 mL | Freq: Once | INTRAMUSCULAR | Status: AC | PRN
  Administered 2019-09-15: 7 mL

## 2019-09-15 MED ORDER — LIDOCAINE VISCOUS HCL 2 % MT SOLN
OROMUCOSAL | Status: AC
  Administered 2019-09-15: 5 mL
  Filled 2019-09-15: qty 15

## 2019-09-15 NOTE — Procedures (Signed)
Interventional Radiology Procedure Note  Procedure: 18 fr J TUBE REPLACEMENT    Complications: None  Estimated Blood Loss:  MIN  Findings: FULL REPORT IN PACS    Tamera Punt, MD

## 2020-01-14 DIAGNOSIS — R1312 Dysphagia, oropharyngeal phase: Secondary | ICD-10-CM | POA: Diagnosis not present

## 2020-02-04 DIAGNOSIS — S39012A Strain of muscle, fascia and tendon of lower back, initial encounter: Secondary | ICD-10-CM | POA: Diagnosis not present

## 2020-02-04 DIAGNOSIS — N390 Urinary tract infection, site not specified: Secondary | ICD-10-CM | POA: Diagnosis not present

## 2020-02-04 DIAGNOSIS — R300 Dysuria: Secondary | ICD-10-CM | POA: Diagnosis not present

## 2020-02-11 NOTE — Progress Notes (Signed)
02/11/2020 [name]  + [id number] 1970-07-13   Chief Complaint: Nausea   History of Present Illness: [name] is a 50 year old female with a past medical history of anxiety, depression, migraine headaches, hypothyroidism, interstitial cystitis,GERD, severe esophagitis, peptic ulcer disease causing a gastric outlet obstruction s/p partial gastrectomy with bilateral vagotomy June 2018, malnutrition and chronic constipation. She was last seen in office by Dr. Hilarie Fredrickson on 05/09/2019. At that time, she continued to have active heartburn and dysphagia despite taking PPI twice daily and Famotidine.  Her persistent reflux symptoms were thought to be due to esophageal stasis and elevated acid secretion. Prior gastrin level was normal.  EGD 09/26/2018 showed grade D acute and erosive ulcerative esophagitis with evidence of an antrectomy with visible sutures without ulcerations and chronic inactive gastritis.  She was not a candidate for [employer] fundoplication due to lack of esophageal peristalsis which was confirmed by high resolution esophageal manometry 03/15/2019.  She was referred to rheumatologist Dr. Amil Amen to rule out scleroderma which could contribute to her esophageal symptoms.  She stated seeing Dr. Amil Amen on 05/26/2019 and laboratory studies were negative for scleroderma and no further follow-up was recommended.  I will request a copy of Dr. Melissa Noon consultation for further review.  Unortunately, she continued to lose weight and a J-tube was placed on 07/20/2019 with nighttime tube feedings Osmolite 1.5 at 60cc/hr x8 hours. She initially gained weight, highest weight was  92 pounds November into December 2021.  However, she has lost 10 pounds over the past 4 weeks because she reduced her nighttime tube feedings to twice weekly as she cannot afford the daily supply of tube feedings [employer] covers 80% of cost which results in $ 80 out of pocket per month").  Weight today is 82lbs. She  typically eats a  frozen prepared meal or 2 Junior Whopper's several days weekly 1 hour before going to bed.  Eating is a chore and not enjoyable. She has constant nausea, it never goes away.  She has dry heaves or vomits at least once daily.  No hematemesis.  She is no longer taking Promethazine, Zofran or Reglan as these medications were ineffective.  She continues to take Pantoprazole 40mg  po tid. She has chronic constipation.  She is no longer taking Linzess which stopped working. She typically goes 5 to 6 days without passing a bowel movement, she  then takes 4-5 OTC laxative tablets once weekly which results in passing a large amount of loose diarrhea stool.  She passes a small formed stool once weekly.  No rectal bleeding or black stool.  She reported undergoing a colonoscopy 5 years ago which was normal.  I am unable to locate a colonoscopy procedure report in epic.  She does not recall where she had the colonoscopy completed.   CBC Latest Ref Rng & Units 08/09/2019 08/09/2019 05/16/2019  WBC 4.0 - 10.5 K/uL 12.8(H) 7.9 8.1  Hemoglobin 12.0 - 15.0 g/dL 13.0 11.5(L) 11.8(L)  Hematocrit 36.0 - 46.0 % 41.7 38.2 36.2  Platelets 150 - 400 K/uL 243 239 227.0    CMP Latest Ref Rng & Units 08/09/2019 05/16/2019 09/06/2018  Glucose 70 - 99 mg/dL - 97 79  BUN 6 - 23 mg/dL - 11 14  Creatinine 0.44 - 1.00 mg/dL 0.72 0.71 0.68  Sodium 135 - 145 mEq/L - 141 140  Potassium 3.5 - 5.1 mEq/L - 3.6 3.9  Chloride 96 - 112 mEq/L - 108 108  CO2 19 -  32 mEq/L - 28 23  Calcium 8.4 - 10.5 mg/dL - 8.5 8.7  Total Protein 6.0 - 8.3 g/dL - 5.7(L) 6.2  Total Bilirubin 0.2 - 1.2 mg/dL - 0.2 0.2  Alkaline Phos 39 - 117 U/L - 74 77  AST 0 - 37 U/L - 12 16  ALT 0 - 35 U/L - 6 11    Current Outpatient Medications on File Prior to Visit  Medication Sig Dispense Refill  . acetaminophen (TYLENOL) 325 MG tablet Take 1-2 tablets (325-650 mg total) by mouth every 6 (six) hours as needed for mild pain (pain score 1-3 or temp >  100.5).    Marland Kitchen albuterol (VENTOLIN HFA) 108 (90 Base) MCG/ACT inhaler Inhale 1-2 puffs into the lungs every 4 (four) hours as needed for shortness of breath or wheezing.    . Aspirin-Salicylamide-Caffeine (BC HEADACHE PO) Take 1 packet by mouth daily as needed (pain/headache).     . gabapentin (NEURONTIN) 600 MG tablet Take 600 mg by mouth 3 (three) times daily.    . hydrOXYzine (ATARAX/VISTARIL) 25 MG tablet Take 25 mg by mouth at bedtime as needed for sleep.    . pantoprazole (PROTONIX) 40 MG tablet Take 1 tablet (40 mg total) by mouth in the morning, at noon, and at bedtime. 270 tablet 1  . polyvinyl alcohol (LIQUIFILM TEARS) 1.4 % ophthalmic solution Place 1 drop into both eyes as needed for dry eyes.    . promethazine (PHENERGAN) 25 MG tablet Take 25 mg by mouth 3 (three) times daily as needed for nausea/vomiting.  1  . QUEtiapine (SEROQUEL) 200 MG tablet Take 200 mg by mouth at bedtime.    . traMADol (ULTRAM) 50 MG tablet Take 50-100 mg by mouth every 6 (six) hours as needed for moderate pain.     . traZODone (DESYREL) 50 MG tablet Take 50 mg by mouth at bedtime.     No current facility-administered medications on file prior to visit.   Allergies  Allergen Reactions  . Sulfamethoxazole Anaphylaxis  . Morphine And Related Other (See Comments)    Delirium. Makes me go "crazy"  . Xanax Starla Link Er] Other (See Comments)    Enhanced depression; feels gloomy    Current Medications, Allergies, Past Medical History, Past Surgical History, Family History and Social History were reviewed in [employer] record.   Review of Systems:   Constitutional: Negative for fever, sweats, chills or weight loss.  Respiratory: Negative for shortness of breath.   Cardiovascular: Negative for chest pain, palpitations and leg swelling.  Gastrointestinal: See HPI.  Musculoskeletal: Negative for back pain or muscle aches.  Neurological: Negative for dizziness, headaches or  paresthesias.    Physical Exam: There were no vitals taken for this visit.  Wt Readings from Last 3 Encounters:  02/12/20 82 lb (37.2 kg)  08/12/19 (!) 84 lb 3.5 oz (38.2 kg)  07/11/19 84 lb 12.8 oz (38.5 kg)   General: Petite thin 50 year old female in no acute distress. Head: Normocephalic and atraumatic. Eyes: No scleral icterus. Conjunctiva pink . Ears: Normal auditory acuity. Mouth: Dentition intact. No ulcers or lesions.  Lungs: Clear throughout to auscultation. Heart: Regular rate and rhythm, no murmur. Abdomen: Soft, nontender and nondistended. No masses or hepatomegaly. Normal bowel sounds x 4 quadrants. Midline upper abdominal scar intact.  Rectal: Deferred.  Musculoskeletal: Symmetrical with no gross deformities. Extremities: No edema. Neurological: Alert oriented x 4. No focal deficits.  Psychological: Alert and cooperative. Normal mood and affect  Assessment and Recommendations:  69.  50 year old female with a history of peptic ulcer disease causing a gastric outlet obstruction s/p partial gastrectomy with bilateral vagotomy June 2018, GERD/severe esophagitis/esophageal aperistalsis with persistent nausea and vomiting refractory to PPI, H2 blockers, Zofran, Promethazine and Reglan  with associated weight loss. S/P J tube placement 08/09/2019 with night time tube feedings resulted in a 10 lb weight gain. Unfortunately, she could not afford the tube feedings so she reduced the night time tube feedings 60cc/hr to two nights weekly which resulted in losing 10lbs over the past 4 weeks.  -Continue Pantoprazole 40mg  po tid  -Nutritionist referral to include J-tube feeding recommendations during the night as well as daytime boluses -Once she has an affordable daily supply of Osmolite or other tube feedings recommended by the nutritionist, she will stop all oral food for 2 to 4 weeks to allow the esophagus to heal and plan on a repeat EGD in the next few months with Dr.  Hilarie Fredrickson -Patient will call our office when she has a daily supply of tube feedings -Discussed trial with Marinol 2.5 to 5 mg po bid, patient agrees to try -Patient to follow up with Dr. Hilarie Fredrickson in  4 to 6 weeks   2. Chronic constipation -Patient wishes to continue with otc stimulant laxative once weekly   3. Colon cancer screening. Patient reported competing a colonoscopy 5 years ago, records not in [place]. -Request copy of past colonoscopy report from PCP

## 2020-02-12 ENCOUNTER — Other Ambulatory Visit (INDEPENDENT_AMBULATORY_CARE_PROVIDER_SITE_OTHER): Payer: Medicare Other

## 2020-02-12 ENCOUNTER — Encounter: Payer: Self-pay | Admitting: Nurse Practitioner

## 2020-02-12 ENCOUNTER — Ambulatory Visit (INDEPENDENT_AMBULATORY_CARE_PROVIDER_SITE_OTHER): Payer: Medicare Other | Admitting: Nurse Practitioner

## 2020-02-12 ENCOUNTER — Other Ambulatory Visit: Payer: Self-pay

## 2020-02-12 VITALS — BP 110/70 | HR 90 | Ht 59.0 in | Wt 82.0 lb

## 2020-02-12 DIAGNOSIS — R634 Abnormal weight loss: Secondary | ICD-10-CM

## 2020-02-12 DIAGNOSIS — E43 Unspecified severe protein-calorie malnutrition: Secondary | ICD-10-CM

## 2020-02-12 DIAGNOSIS — R112 Nausea with vomiting, unspecified: Secondary | ICD-10-CM

## 2020-02-12 LAB — COMPREHENSIVE METABOLIC PANEL
ALT: 9 U/L (ref 0–35)
AST: 10 U/L (ref 0–37)
Albumin: 3.8 g/dL (ref 3.5–5.2)
Alkaline Phosphatase: 178 U/L — ABNORMAL HIGH (ref 39–117)
BUN: 22 mg/dL (ref 6–23)
CO2: 22 mEq/L (ref 19–32)
Calcium: 8.8 mg/dL (ref 8.4–10.5)
Chloride: 111 mEq/L (ref 96–112)
Creatinine, Ser: 0.81 mg/dL (ref 0.40–1.20)
GFR: 86.11 mL/min (ref >60.00)
Glucose, Bld: 57 mg/dL — ABNORMAL LOW (ref 70–99)
Potassium: 3.5 mEq/L (ref 3.5–5.1)
Sodium: 142 mEq/L (ref 135–145)
Total Bilirubin: 0.3 mg/dL (ref 0.2–1.2)
Total Protein: 6.1 g/dL (ref 6.0–8.3)

## 2020-02-12 LAB — CBC WITH DIFFERENTIAL/PLATELET
Basophils Absolute: 0.1 K/uL (ref 0.0–0.1)
Basophils Relative: 0.7 % (ref 0.0–3.0)
Eosinophils Absolute: 0.3 K/uL (ref 0.0–0.7)
Eosinophils Relative: 2 % (ref 0.0–5.0)
HCT: 35.8 % — ABNORMAL LOW (ref 36.0–46.0)
Hemoglobin: 11.4 g/dL — ABNORMAL LOW (ref 12.0–15.0)
Lymphocytes Relative: 20.8 % (ref 12.0–46.0)
Lymphs Abs: 3.1 K/uL (ref 0.7–4.0)
MCHC: 31.8 g/dL (ref 30.0–36.0)
MCV: 87.4 fl (ref 78.0–100.0)
Monocytes Absolute: 0.9 K/uL (ref 0.1–1.0)
Monocytes Relative: 6 % (ref 3.0–12.0)
Neutro Abs: 10.6 K/uL — ABNORMAL HIGH (ref 1.4–7.7)
Neutrophils Relative %: 70.5 % (ref 43.0–77.0)
Platelets: 268 K/uL (ref 150.0–400.0)
RBC: 4.1 Mil/uL (ref 3.87–5.11)
RDW: 19.1 % — ABNORMAL HIGH (ref 11.5–15.5)
WBC: 15.1 K/uL — ABNORMAL HIGH (ref 4.0–10.5)

## 2020-02-12 NOTE — Patient Instructions (Addendum)
If you are age 48 or younger, your body mass index should be between 19-25. Your Body mass index is 16.56 kg/m. If this is out of the aformentioned range listed, please consider follow up with your Primary Care Provider.   LABS:   Your provider has requested that you go to the basement level for lab work before leaving today. Press "B" on the elevator. The lab is located at the first door on the left as you exit the elevator.  HEALTHCARE LAWS AND MY CHART RESULTS: Due to recent changes in healthcare laws, you may see the results of your imaging and laboratory studies on MyChart before your provider has had a chance to review them.   We understand that in some cases there may be results that are confusing or concerning to you. Not all laboratory results come back in the same time frame and the provider may be waiting for multiple results in order to interpret others.  Please give [place] 48 hours in order for your provider to thoroughly review all the results before contacting the office for clarification of your results.   It was great seeing you today!  Thank you for entrusting me with your care and choosing [facility].  Noralyn Pick, CRNP

## 2020-02-13 ENCOUNTER — Telehealth: Payer: Self-pay | Admitting: Nurse Practitioner

## 2020-02-13 ENCOUNTER — Other Ambulatory Visit: Payer: Self-pay | Admitting: Nurse Practitioner

## 2020-02-13 MED ORDER — DRONABINOL 2.5 MG PO CAPS
2.5000 mg | ORAL_CAPSULE | Freq: Two times a day (BID) | ORAL | 0 refills | Status: AC
Start: 2020-02-13 — End: 2020-03-14

## 2020-02-13 NOTE — Telephone Encounter (Signed)
Please advise, I do not see an order on the order sheet.

## 2020-02-13 NOTE — Telephone Encounter (Signed)
[name], please inform the patient I sent RX for Marinol to her pharmacy, to start low dose 2.5mg  one po bid prior to meals.   Please enter a Registered Nutritionist Referral. Refer to her last office visit for DX. Referral to include daytime and nighttime tube feeding recommendations. Tube feedings must be affordable.  Patient to see nutritionist ASAP.  Let me know if you have any questions.   Thank you.

## 2020-02-15 NOTE — Telephone Encounter (Signed)
I will do my best to work on this today between patients in clinic.

## 2020-02-15 NOTE — Telephone Encounter (Signed)
referral has been placed, patient will be contacted by their office.  Medication sent but is not covered by insurance so will work on a PA.

## 2020-02-15 NOTE — Telephone Encounter (Signed)
Calling to check on status of her medication and referral to the nutritionist please.

## 2020-02-15 NOTE — Progress Notes (Signed)
Addendum: Reviewed and agree with assessment and management plan. Iona Stay M, MD  

## 2020-02-15 NOTE — Addendum Note (Signed)
Addended by: Cardell Peach I on: 02/15/2020 04:15 PM   Modules accepted: Orders

## 2020-02-19 ENCOUNTER — Other Ambulatory Visit: Payer: Self-pay | Admitting: Nurse Practitioner

## 2020-02-19 DIAGNOSIS — E43 Unspecified severe protein-calorie malnutrition: Secondary | ICD-10-CM

## 2020-02-19 DIAGNOSIS — R112 Nausea with vomiting, unspecified: Secondary | ICD-10-CM

## 2020-02-19 DIAGNOSIS — R634 Abnormal weight loss: Secondary | ICD-10-CM

## 2020-02-19 NOTE — Telephone Encounter (Signed)
PA has been denied. Please advise. Patient Name: [name] Patient DOB: August 01, 1970  Patient ID: [id number]  Status of Request: Deny Medication Name: Dronabinol Cap 2.5mg  GPI/NDC: [id number] Decision Notes: Medicare allows [place] to cover a drug only when it is a Part D drug. A Part D drug is one that is used for a "medically accepted indication." A medically accepted indication means the use is approved by the Food and Drug Administration (FDA) OR the use is supported by one of the following accepted references: (1) [place]; OR (2) Micromedex [place]. Dronabinol Cap 2.5mg  is not FDA approved for your medical condition(s): Malnutrition ([place]) Protein-calorie malnutrition, severe/weight loss/Nausea and vomiting, intractability of vomiting not specified, unspecified vomiting type. These condition(s) are not supported by one of the accepted references. Therefore your drug is denied because it is not being used for a "medically accepted indication." Reviewed by: [email or web address], Rph

## 2020-02-19 NOTE — Telephone Encounter (Signed)
PA has been sent, awaiting insurance response.

## 2020-02-20 ENCOUNTER — Telehealth: Payer: Self-pay | Admitting: Nurse Practitioner

## 2020-02-20 NOTE — Telephone Encounter (Signed)
See lab note msg I sent to Dr. Hilarie Fredrickson.

## 2020-02-20 NOTE — Telephone Encounter (Signed)
Pt is wanting to inform the nurse that the nutrionist that she was referred to charges $240/hr and her insurance does not cover it, pt would like to know what else she can do.

## 2020-02-20 NOTE — Telephone Encounter (Signed)
Spoke to patient to let her know that Dr Hilarie Fredrickson  copied Clayton Bibles, and patient clinical dietitian, who saw the patient in July for any resources or support. We will contact her once we receive advice. Patient voiced understanding.

## 2020-02-23 ENCOUNTER — Telehealth: Payer: Self-pay

## 2020-02-23 NOTE — Telephone Encounter (Signed)
Patient is calling to follow up on previous message 

## 2020-02-23 NOTE — Telephone Encounter (Signed)
-----   Message from Noralyn Pick, NP sent at 02/22/2020  4:15 PM EST ----- Regarding: FW: Feeding tube Hi Claiborne Billings,  I contacted [place] management to request any possible resource for this patient regarding her tube feedings. Attached is a message with Julia's response. Can you contact the patient tomorrow and see if she has computer access and if she is willing to sign up for the [employer] supplement assistance program?  Refer to her last office visit and multiple phone communications.   I am off work tomorrow but you can contact me if you have any questions. I really appreciate your assistance with this.   Colleen    ----- Message ----- From: Leafy Ro Sent: 02/22/2020   3:57 PM EST To: Noralyn Pick, NP Subject: Feeding tube                                   Hello Colleen,   I apologize for taking so long, it's been one of those days. I spoke to others and they said that the product that she needs is an [employer] product. Try this website https://www.hamilton.com/. [employer] has a program to help with supplements if it is the sole nutrition source. If insurance doesn't cover the Osmolite they may have grants or something if the patient qualifies. I hope this helps. Again, sorry for the delay.   [place], Care Management Phone: [phone] Email: [email or web address]

## 2020-02-23 NOTE — Telephone Encounter (Signed)
Spoke to patient this afternoon to inform her of the information received from [place] Management for tube feeding assistance. Patient states that she has access to a computer and was interested in signing up for the [place] assistance program. She was given the website provided by Claretta Fraise with [place], Care Management. Patient was asked to follow up with [place] to report her progress with the assistance program or if she has any questions or concerns. All questions answered. Patient voiced understanding.

## 2020-02-23 NOTE — Telephone Encounter (Signed)
Spoke to patient see telephone message.

## 2020-03-01 ENCOUNTER — Telehealth: Payer: Self-pay | Admitting: Nurse Practitioner

## 2020-03-01 NOTE — Telephone Encounter (Signed)
Pt is requesting a call back from a nurse to discuss how her MARINOL could be covered by her insurance.

## 2020-03-01 NOTE — Telephone Encounter (Signed)
Routing to nurse

## 2020-03-01 NOTE — Telephone Encounter (Signed)
Spoke to patient to inform her that her insurance has denied coverage for the Marinol. She states that she will pay the cost as it is helping with her nausea.

## 2020-03-11 ENCOUNTER — Telehealth: Payer: Self-pay | Admitting: Internal Medicine

## 2020-03-11 NOTE — Telephone Encounter (Signed)
Pt is requesting a call back from a nurse to discuss some complications with her feeding tube, pt would like to further discuss with the nurse.

## 2020-03-11 NOTE — Telephone Encounter (Signed)
Pt states for over a week her feeding tube has been hurting. Reports when she moves it feels like the tube is snagging on something. States it is not leaking which is kind of odd because it usually leaks. Pt states she called the MD that placed the tube and he told her to call GI. Please advise.

## 2020-03-11 NOTE — Telephone Encounter (Signed)
Would place order for IR tube check, exchange if not functional

## 2020-03-12 ENCOUNTER — Other Ambulatory Visit: Payer: Self-pay

## 2020-03-12 DIAGNOSIS — Z4659 Encounter for fitting and adjustment of other gastrointestinal appliance and device: Secondary | ICD-10-CM

## 2020-03-12 NOTE — Telephone Encounter (Signed)
Pt aware, order in epic for IR referral. IR will contact pt regarding appt.

## 2020-03-14 ENCOUNTER — Other Ambulatory Visit: Payer: Self-pay

## 2020-03-14 ENCOUNTER — Other Ambulatory Visit: Payer: Self-pay | Admitting: Internal Medicine

## 2020-03-14 ENCOUNTER — Ambulatory Visit (HOSPITAL_COMMUNITY)
Admission: RE | Admit: 2020-03-14 | Discharge: 2020-03-14 | Disposition: A | Discharge status: Home / Self Care | Payer: Medicare Other | Source: Ambulatory Visit | Attending: Internal Medicine | Admitting: Internal Medicine

## 2020-03-14 DIAGNOSIS — T85848A Pain due to other internal prosthetic devices, implants and grafts, initial encounter: Secondary | ICD-10-CM | POA: Insufficient documentation

## 2020-03-14 DIAGNOSIS — X58XXXA Exposure to other specified factors, initial encounter: Secondary | ICD-10-CM | POA: Insufficient documentation

## 2020-03-14 DIAGNOSIS — Z4659 Encounter for fitting and adjustment of other gastrointestinal appliance and device: Secondary | ICD-10-CM

## 2020-03-14 HISTORY — PX: IR SINUS/FIST TUBE CHK-NON GI: IMG673

## 2020-03-14 MED ORDER — IOHEXOL 300 MG/ML  SOLN
50.0000 mL | Freq: Once | INTRAMUSCULAR | Status: AC | PRN
  Administered 2020-03-14: 10 mL

## 2020-03-27 ENCOUNTER — Encounter: Payer: Self-pay | Admitting: Nurse Practitioner

## 2020-03-27 ENCOUNTER — Ambulatory Visit (INDEPENDENT_AMBULATORY_CARE_PROVIDER_SITE_OTHER): Payer: Medicare Other | Admitting: Nurse Practitioner

## 2020-03-27 VITALS — BP 110/70 | HR 73 | Ht 59.0 in | Wt 78.8 lb

## 2020-03-27 DIAGNOSIS — Z8711 Personal history of peptic ulcer disease: Secondary | ICD-10-CM

## 2020-03-27 DIAGNOSIS — E43 Unspecified severe protein-calorie malnutrition: Secondary | ICD-10-CM | POA: Diagnosis not present

## 2020-03-27 DIAGNOSIS — R627 Adult failure to thrive: Secondary | ICD-10-CM | POA: Diagnosis not present

## 2020-03-27 DIAGNOSIS — R634 Abnormal weight loss: Secondary | ICD-10-CM

## 2020-03-27 DIAGNOSIS — Z903 Acquired absence of stomach [part of]: Secondary | ICD-10-CM

## 2020-03-27 NOTE — Progress Notes (Signed)
03/27/2020 [name] [id number] 1970-04-17   Chief Complaint: Nausea, vomiting   History of Present Illness:  [name] is a 50 year old female with a past medical history of anxiety, depression, migraine headaches, hypothyroidism, interstitial cystitis,GERD, severe esophagitis, peptic ulcer disease causing a gastric outlet obstruction s/p partial gastrectomy with bilateral vagotomy June 2018, malnutrition and chronic constipation.  Refer to office consult note 04/11/2020 for comprehensive history review.  She is followed by Dr. Hilarie Fredrickson. She presents today for further GI follow-up. Her GI symptoms have not significant changed. Her nausea decreased on Marinol 2.5 mg po bid for about 1 to 2 weeks then increased back to her normal level of nausea.  She has dry heaves on a daily basis and she vomits up acid secretions or partially digested food once or twice daily.  One day last week she vomited up bilious type emesis with a small spot of red blood. She remains on Osmolite per night 60 ml/hr 6 hours night.  She has not changed eating pattern. She eats and goes to bed 30 minutes later.  She eats frozen foods, pizza, hamburgers and [place].  She does not cook her meals.  She is passing small formed stools once or twice weekly.  No rectal bleeding or melena.  She has lost 4 pounds over the past 6 weeks.  Today's weight is 78 pounds.  He was provided with a nutritional dietitian referral, however, she did not schedule an appointment as her insurance did not cover the nutrition she was referred to.  She agreed to contact her insurance carrier for list of nutritionist who recovered but she did not follow-up on this.  She wishes to be referred to the initial nutritionist we referred her to as her father will cover the cost.  Previously discussed maximizing her Osmolite tube feedings at night but with the assistance of a nutritionist to provide instructions for Osmolite boluses via PEG tube during the daytime  while she is at work.  No other complaints at this time.   Current Outpatient Medications on File Prior to Visit  Medication Sig Dispense Refill   acetaminophen (TYLENOL) 325 MG tablet Take 1-2 tablets (325-650 mg total) by mouth every 6 (six) hours as needed for mild pain (pain score 1-3 or temp > 100.5).     albuterol (VENTOLIN HFA) 108 (90 Base) MCG/ACT inhaler Inhale 1-2 puffs into the lungs every 4 (four) hours as needed for shortness of breath or wheezing.     Aspirin-Salicylamide-Caffeine (BC HEADACHE PO) Take 1 packet by mouth daily as needed (pain/headache).      gabapentin (NEURONTIN) 600 MG tablet Take 600 mg by mouth 3 (three) times daily.     hydrOXYzine (ATARAX/VISTARIL) 25 MG tablet Take 25 mg by mouth at bedtime as needed for sleep.     pantoprazole (PROTONIX) 40 MG tablet Take 1 tablet (40 mg total) by mouth in the morning, at noon, and at bedtime. 270 tablet 1   polyvinyl alcohol (LIQUIFILM TEARS) 1.4 % ophthalmic solution Place 1 drop into both eyes as needed for dry eyes.     promethazine (PHENERGAN) 25 MG tablet Take 25 mg by mouth 3 (three) times daily as needed for nausea/vomiting.  1   QUEtiapine (SEROQUEL) 200 MG tablet Take 200 mg by mouth at bedtime.     traMADol (ULTRAM) 50 MG tablet Take 50-100 mg by mouth every 6 (six) hours as needed for moderate pain.      traZODone (  DESYREL) 50 MG tablet Take 50 mg by mouth at bedtime.     No current facility-administered medications on file prior to visit.   Allergies  Allergen Reactions   Sulfamethoxazole Anaphylaxis   Morphine And Related Other (See Comments)    Delirium. Makes me go "crazy"   Xanax [name]] Other (See Comments)    Enhanced depression; feels gloomy     Current Medications, Allergies, Past Medical History, Past Surgical History, Family History and Social History were reviewed in [employer] record.   Review of Systems:   Constitutional: Negative for  fever, sweats, chills or weight loss.  Respiratory: Negative for shortness of breath.   Cardiovascular: Negative for chest pain, palpitations and leg swelling.  Gastrointestinal: See HPI.  Musculoskeletal: Negative for back pain or muscle aches.  Neurological: Negative for dizziness, headaches or paresthesias.    Physical Exam: BP 110/70    Pulse 73    Ht 4\' 11"  (1.499 m)    Wt 78 lb 12.8 oz (35.7 kg)    SpO2 98%    BMI 15.92 kg/m    Wt Readings from Last 3 Encounters:  03/27/20 78 lb 12.8 oz (35.7 kg)  02/12/20 82 lb (37.2 kg)  08/12/19 (!) 84 lb 3.5 oz (38.2 kg)   General: chronically ill female in no acute distress. Head: Normocephalic and atraumatic. Eyes: No scleral icterus. Conjunctiva pink . Ears: Normal auditory acuity. Mouth: Dentition intact. No ulcers or lesions.  Lungs: Clear throughout to auscultation. Heart: Regular rate and rhythm, no murmur. Abdomen: Soft, nontender and nondistended. No masses or hepatomegaly. Normal bowel sounds x 4 quadrants.  Rectal: deferred.  Musculoskeletal: Symmetrical with no gross deformities. Extremities: No edema. Neurological: Alert oriented x 4. No focal deficits.  Psychological: Alert and cooperative. Normal mood and affect  Assessment and Recommendations:  45.  50 year old female with a history of peptic ulcer disease causing a gastric outlet obstruction s/p partial gastrectomy with bilateral vagotomy June 2018, GERD/severe esophagitis/esophageal aperistalsis with persistent nausea and vomiting refractory to PPI, H2 blockers, Zofran, Promethazine and Reglan with associated weight loss and failure to thrive.  S/P J tube placement 08/09/2019 with night time tube feedings. She continues to lose weight.  -Increase Marinol 2.5mg  to 2 tabs in am and 1 tab in the pm -Nutritionist consult to provide nighttime Osmolite tube feeding via pump and daytime Osmolite bolus feedings with close monitoring for refeeding syndrome -Consider trial with  Mirtazapine, to discuss further with Dr. Germain Osgood  2. Chronic constipation -Patient wishes to continue with otc stimulant laxative once weekly   3. Colon cancer screening. Patient reported competing a colonoscopy 5 years ago, records not in [place]. -Request copy of past colonoscopy report from PCP

## 2020-03-27 NOTE — Patient Instructions (Addendum)
If you are age 48 or younger, your body mass index should be between 19-25. Your Body mass index is 15.92 kg/m. If this is out of the aformentioned range listed, please consider follow up with your Primary Care Provider.   MEDICATION: We have sent the following medication to your pharmacy for you to pick up at your convenience: Marinol 2.5MG  2 tablets in the morning and 1 tablet in the afternoon.  We have scheduled you a follow up with Dr. Henrene Pastor on 05/15/20 at 2:40pm.  RECOMMENDATIONS: Avoid eating within 2-3 hours before going to bed.  We resent the nutational referral. Please let us know if you do not hear from them in a couple weeks.  It was great seeing you today! Thank you for entrusting me with your care and choosing [facility].  Noralyn Pick, CRNP

## 2020-03-29 ENCOUNTER — Other Ambulatory Visit (HOSPITAL_COMMUNITY): Payer: Self-pay | Admitting: Nurse Practitioner

## 2020-03-29 ENCOUNTER — Telehealth: Payer: Self-pay | Admitting: Nurse Practitioner

## 2020-03-29 MED ORDER — DRONABINOL 2.5 MG PO CAPS
ORAL_CAPSULE | ORAL | 0 refills | Status: DC
Start: 2020-03-29 — End: 2020-03-29

## 2020-03-29 MED ORDER — DRONABINOL 2.5 MG PO CAPS: ORAL_CAPSULE | ORAL | 0 refills | Status: DC

## 2020-03-29 NOTE — Telephone Encounter (Signed)
I called the patient and I sent in RX,  I had difficulties with [employer] as it was not recognizing my fingerprint which is required to send in this scheduled drug. I was able to use [employer] app on phone and Rx was sent.

## 2020-03-29 NOTE — Telephone Encounter (Signed)
Inbound call from patient checking on status on script; is very upset.  States she does not have any left for the weekend and should of received script by now.  Please advise.

## 2020-04-30 ENCOUNTER — Encounter: Payer: Medicare Other | Attending: Nurse Practitioner | Admitting: Dietician

## 2020-04-30 ENCOUNTER — Encounter: Payer: Self-pay | Admitting: Dietician

## 2020-04-30 ENCOUNTER — Other Ambulatory Visit: Payer: Self-pay

## 2020-04-30 DIAGNOSIS — R627 Adult failure to thrive: Secondary | ICD-10-CM | POA: Diagnosis not present

## 2020-04-30 DIAGNOSIS — R634 Abnormal weight loss: Secondary | ICD-10-CM | POA: Insufficient documentation

## 2020-04-30 DIAGNOSIS — Z8711 Personal history of peptic ulcer disease: Secondary | ICD-10-CM | POA: Insufficient documentation

## 2020-04-30 DIAGNOSIS — E43 Unspecified severe protein-calorie malnutrition: Secondary | ICD-10-CM | POA: Insufficient documentation

## 2020-04-30 DIAGNOSIS — Z903 Acquired absence of stomach [part of]: Secondary | ICD-10-CM | POA: Insufficient documentation

## 2020-04-30 NOTE — Progress Notes (Signed)
Medical Nutrition Therapy  Appointment Start time:  1400  Appointment End time:  1500  Primary concerns today: Malnutrition  Referral diagnosis: R62.7 Failure to Thrive in adult, E43 Protein-Calorie malnutrition, Severe, R63.4 Loss of weight, Z87.11 History of peptic ulcer disease, Z90.3 History of partial Gastrectomy Preferred learning style: No preference indicated Learning readiness: Ready   NUTRITION ASSESSMENT   Anthropometrics  Ht: 4'11" Wt: 76 lbs Body mass index is 15.35 kg/m.   Clinical Medical Hx: Gastrectomy, Jejunostomy feeding tube, GERD, Malnutrition Medications: Marinol, Gabapentin, Hydroxyzine Labs: Glucose - 57 (low), Alkaline Phosphatase - 178 (high) Notable Signs/Symptoms: Fatigue, Severe N/V, severe muscle wasting (NFPE)  Lifestyle & Dietary Hx Pt has history of distal gastrectomy in 2018 due to life threatening ulcer.  Pt has a J-PEG tube. Pt uses Osmolite 1.5. Pt does continuous feed overnight (2 10mL/hr for ~6 hrs, 2 8 oz bottles) and tolerates it pretty well. Pt will not administer nocturnal feeds on days they have to go into work because they are afraid they are going to be sick. Pt is taking marinol for nausea and it helps tremendously. Pt reports swallowing food and that it needs to come up within an hour. Pt only tries to eat solid food at night, and can tolerate it. Lays down at 30 degrees, reflux is minimal. Pt reports times of vomiting the food they ate before going to bed and it will be undigested.Pt reports feeling nauseas, weakness, tied at appointment.  Pt reports common foods they eat at night are whopper juniors, pizza. Pt does not tolerate dairy Pt goes to sleep around 11:00 - 11:30 and wakes around 7 am. Drinks a little soda, tries to drink water. Pt reports using women's Dulcolax pills to have bowel movements. Pt took 5 pills on 04/28/2020 and has not had a bowel movement as of 2:30 PM on 04/30/2020.    Estimated daily fluid intake:  ~761mL Supplements: None Sleep: Sleeps well Stress / self-care: Low Current average weekly physical activity: N/A  24-Hr Dietary Recall Unable to eat all day except for a few grapes.  Did not do nocturnal feeding last night either.   Estimated Energy Needs via EN Calories: 1000 Carbohydrate: 135g Protein: 42g Fat: 32g   NUTRITION DIAGNOSIS  [place]-3.1 Underweight As related to inadequate protein energy intake.  As evidenced by BMI of 15.35 kg/m2, weight loss of 7% in 3 months, severe N/V when eating solid foods, and inconsistent administration of nocturnal EN feedings.   NUTRITION INTERVENTION  Nutrition education (E-1) on the following topics:  . Protein-energy malnutrition. o Advised pt to increase caloric intake via Osmolite 1.5 to ~1,000 kcal per day. Educated pt on the importance of regaining muscle mass on overall mortality. Recommend pt gain weight at a rate of .5 - 1.0 lbs per week. Advise pt to eat whatever solid food they are able to tolerate and keep down to increase calories. . Enteral Nutrition o Educated pt on the proper rate to increase continuous feedings over night to assess tolerance and safely increase caloric intake. Educated pt on the proper way to administer an afternoon bolus feeding gradually to assess tolerance. Advise pt to use ice chips or small sips of water throughout the day to achieve 1,000 mL of water intake per day.  Handouts Provided Include   [employer] [facility] Patient Assistance Program Application  Learning Style & Readiness for Change Teaching method utilized: Visual & Auditory  Demonstrated degree of understanding via: Teach Back  Barriers to learning/adherence to lifestyle  change: GI  Goals Established by Pt  Increase your intake of Osmolite 1.5 during your nocturnal feeding. Increase your night time feeding rate to 70 mL/hr containers overnight.  Begin a bolus feeding during the around 11:00 AM - 1:00 PM. Always start with a flush of 30 mL  of warm water. Administer 1/2 container of Osmolite 1.5 slowly, taking about 10-15 minutes to complete. Wait 30-45 minutes to assess tolerance.  Try sucking on ice chips, or shaved ice to increase your water intake.  Prioritize your nutrition. Administer your nocturnal feeding EVERY night!   MONITORING & EVALUATION Dietary intake, weekly physical activity, and weight gain PRN.  Next Steps  Patient is to increase caloric intake via enteral feedings.

## 2020-04-30 NOTE — Patient Instructions (Addendum)
Increase your intake of Osmolite 1.5 during your nocturnal feeding. Increase your night time feeding rate to 70 mL/hr containers overnight.  Begin a bolus feeding during the around 11:00 AM - 1:00 PM. Always start with a flush of 30 mL of warm water. Administer 1/2 container of Osmolite 1.5 slowly, taking about 10-15 minutes to complete. Wait 30-45 minutes to assess tolerance.  Try sucking on ice chips, or shaved ice to increase your water intake.  Prioritize your nutrition. Administer your nocturnal feeding EVERY night!  Speak to your gastroenterologist about what needs to be done to increase your supply of Osmolite 1.5

## 2020-05-01 ENCOUNTER — Telehealth: Payer: Self-pay | Admitting: Nurse Practitioner

## 2020-05-01 NOTE — Telephone Encounter (Signed)
Beth, I see this patient has a follow up appointment with Dr. Henrene Pastor, not sure when that was set up. However, patient sees Dr. Hilarie Fredrickson. Pls contact patient and cancel her 05/15/2020 appointment with Dr. Henrene Pastor and reschedule her with her primary GI Dr. Hilarie Fredrickson. thx

## 2020-05-01 NOTE — Telephone Encounter (Addendum)
Spoke with her and moved her to the correct provider. She has seen the RD. She is to be taking in 3 bottles (210ml) of Osmolite daily now. 2.5 @ at night and 0.5 bottle day. Needs a new Rx for this. She is requesting a refill for Marinol as well. [facility] [place]. Thanks

## 2020-05-01 NOTE — Telephone Encounter (Signed)
Opening on the schedule of Dr Hilarie Fredrickson 05/14/20 at 2:30 pm. Called the patient's phone. No answer. Left a message asking she return our call about moving the appointment.

## 2020-05-02 ENCOUNTER — Other Ambulatory Visit: Payer: Self-pay | Admitting: Nurse Practitioner

## 2020-05-02 MED ORDER — DRONABINOL 2.5 MG PO CAPS: ORAL_CAPSULE | ORAL | 0 refills | Status: DC

## 2020-05-02 NOTE — Telephone Encounter (Signed)
Beth, I renewed her Marinol Rx. Pls send in RX for Osmolite 3 bottles Q day, send a 3 month supply if feasible if not one month supply with 3 additional refills. Thx

## 2020-05-02 NOTE — Telephone Encounter (Signed)
Enteral order form received. Completed the order for 3 cans of Osmolite daily. Faxed the order form to [phone].

## 2020-05-02 NOTE — Progress Notes (Signed)
Dr. Orion Crook, pls review nutritionist consult.  Patient has office visit with you on 05/14/2020

## 2020-05-02 NOTE — Telephone Encounter (Signed)
Spoke with [employer] representative.  She will fax the order form for the patient's new order. She will fax to [phone].

## 2020-05-02 NOTE — Telephone Encounter (Signed)
Order was faxed and confirmed at 12:20pm today. Most likely the form has not made it to the Enteral Feeding dept yet.  However, I re-faxed it and will make a follow up phone call tomorrow.

## 2020-05-03 NOTE — Telephone Encounter (Signed)
Patient did not let me know she was out of Osmolite when we spoke.  Called [employer]. Left message for return call.

## 2020-05-03 NOTE — Telephone Encounter (Signed)
Axed form again to the 2 different fax numbers provided by the patient.

## 2020-05-03 NOTE — Telephone Encounter (Signed)
Patient called states she called them and they are telling her they have not received any fax from our office she is requesting we call them.

## 2020-05-03 NOTE — Telephone Encounter (Signed)
Patient is calling to follow up said she has no bags and needs something done by today also provided other fax numbers [phone] and [phone].

## 2020-05-03 NOTE — Telephone Encounter (Signed)
Spoke with Lovena Le , customer rep with [employer].  She has given me new fax numbers [phone] and [phone]. I have a new phone number for contacting [facility] as well. [phone]. This is the correct number for customer service in the [place] region. I have spoke with the patient and given her an update as well as shared with her the new phone number to contact customer care.

## 2020-05-06 ENCOUNTER — Other Ambulatory Visit: Payer: Self-pay

## 2020-05-06 MED ORDER — OSMOLITE 1.2 CAL PO LIQD: ORAL | 99 refills | Status: DC

## 2020-05-06 NOTE — Telephone Encounter (Signed)
[employer] called to provide a better Fax [phone] ATTN Nutrition Care team.

## 2020-05-06 NOTE — Telephone Encounter (Signed)
Faxed to the number provided. Using the order form faxed to me on 05/02/20.

## 2020-05-06 NOTE — Telephone Encounter (Signed)
Inbound call from patient requesting a call from a nurse please.  States provider has not received fax for orders.  Please advise.

## 2020-05-10 ENCOUNTER — Encounter: Payer: Self-pay | Admitting: *Deleted

## 2020-05-14 ENCOUNTER — Encounter: Payer: Self-pay | Admitting: Internal Medicine

## 2020-05-14 ENCOUNTER — Other Ambulatory Visit (INDEPENDENT_AMBULATORY_CARE_PROVIDER_SITE_OTHER): Payer: Medicare Other

## 2020-05-14 ENCOUNTER — Other Ambulatory Visit: Payer: Self-pay

## 2020-05-14 ENCOUNTER — Ambulatory Visit (INDEPENDENT_AMBULATORY_CARE_PROVIDER_SITE_OTHER): Payer: Medicare Other | Admitting: Internal Medicine

## 2020-05-14 VITALS — BP 100/70 | HR 82 | Ht 59.5 in | Wt 74.2 lb

## 2020-05-14 DIAGNOSIS — E43 Unspecified severe protein-calorie malnutrition: Secondary | ICD-10-CM

## 2020-05-14 DIAGNOSIS — K2100 Gastro-esophageal reflux disease with esophagitis, without bleeding: Secondary | ICD-10-CM | POA: Diagnosis not present

## 2020-05-14 DIAGNOSIS — Z903 Acquired absence of stomach [part of]: Secondary | ICD-10-CM | POA: Diagnosis not present

## 2020-05-14 DIAGNOSIS — R627 Adult failure to thrive: Secondary | ICD-10-CM | POA: Diagnosis not present

## 2020-05-14 DIAGNOSIS — K5909 Other constipation: Secondary | ICD-10-CM

## 2020-05-14 LAB — CBC WITH DIFFERENTIAL/PLATELET
Basophils Absolute: 0 10*3/uL (ref 0.0–0.1)
Basophils Relative: 0.4 % (ref 0.0–3.0)
Eosinophils Absolute: 0.1 10*3/uL (ref 0.0–0.7)
Eosinophils Relative: 1.4 % (ref 0.0–5.0)
HCT: 39.4 % (ref 36.0–46.0)
Hemoglobin: 12.8 g/dL (ref 12.0–15.0)
Lymphocytes Relative: 27 % (ref 12.0–46.0)
Lymphs Abs: 2.8 10*3/uL (ref 0.7–4.0)
MCHC: 32.6 g/dL (ref 30.0–36.0)
MCV: 88.4 fl (ref 78.0–100.0)
Monocytes Absolute: 1 10*3/uL (ref 0.1–1.0)
Monocytes Relative: 9.9 % (ref 3.0–12.0)
Neutro Abs: 6.4 10*3/uL (ref 1.4–7.7)
Neutrophils Relative %: 61.3 % (ref 43.0–77.0)
Platelets: 296 10*3/uL (ref 150.0–400.0)
RBC: 4.46 Mil/uL (ref 3.87–5.11)
RDW: 14.8 % (ref 11.5–15.5)
WBC: 10.5 10*3/uL (ref 4.0–10.5)

## 2020-05-14 LAB — IBC + FERRITIN
Ferritin: 26.5 ng/mL (ref 10.0–291.0)
Iron: 53 ug/dL (ref 42–145)
Saturation Ratios: 12 % — ABNORMAL LOW (ref 20.0–50.0)
Transferrin: 315 mg/dL (ref 212.0–360.0)

## 2020-05-14 LAB — COMPREHENSIVE METABOLIC PANEL
ALT: 8 U/L (ref 0–35)
AST: 12 U/L (ref 0–37)
Albumin: 4 g/dL (ref 3.5–5.2)
Alkaline Phosphatase: 102 U/L (ref 39–117)
BUN: 22 mg/dL (ref 6–23)
CO2: 26 mEq/L (ref 19–32)
Calcium: 9.5 mg/dL (ref 8.4–10.5)
Chloride: 105 mEq/L (ref 96–112)
Creatinine, Ser: 0.79 mg/dL (ref 0.40–1.20)
GFR: 88.57 mL/min (ref >60.00)
Glucose, Bld: 84 mg/dL (ref 70–99)
Potassium: 4.4 mEq/L (ref 3.5–5.1)
Sodium: 138 mEq/L (ref 135–145)
Total Bilirubin: 0.3 mg/dL (ref 0.2–1.2)
Total Protein: 6.6 g/dL (ref 6.0–8.3)

## 2020-05-14 LAB — MAGNESIUM: Magnesium: 2.1 mg/dL (ref 1.5–2.5)

## 2020-05-14 LAB — PHOSPHORUS: Phosphorus: 3.5 mg/dL (ref 2.3–4.6)

## 2020-05-14 MED ORDER — LANSOPRAZOLE 3 MG/ML SUSP: ORAL | 2 refills | Status: DC

## 2020-05-14 MED ORDER — ONDANSETRON 4 MG PO TBDP: 4.0000 mg | ORAL_TABLET | Freq: Four times a day (QID) | ORAL | 1 refills | Status: DC | PRN

## 2020-05-14 NOTE — Patient Instructions (Addendum)
Your provider has requested that you go to the basement level for lab work before leaving today. Press "B" on the elevator. The lab is located at the first door on the left as you exit the elevator.  We have sent the following medications to your pharmacy for you to pick up at your convenience: Lansoprazole-10 ml twice daily (in place of pantoprazole) ondansentron-4 mcg-dissolve under the tongue every 6 hours as needed  DISCONTINUE pantoprazole  Continue Marinol.  Continue tube feedings as directed by nutrition- 1.5 overnight at a rate of 70 ml/hr and 0.5 cans bolus midday.  Please follow up with Dr Hilarie Fredrickson at 9:30 am on 06/06/20.  If you are age 87 or younger, your body mass index should be between 19-25. Your Body mass index is 14.75 kg/m. If this is out of the aformentioned range listed, please consider follow up with your Primary Care Provider.   Due to recent changes in healthcare laws, you may see the results of your imaging and laboratory studies on MyChart before your provider has had a chance to review them.  We understand that in some cases there may be results that are confusing or concerning to you. Not all laboratory results come back in the same time frame and the provider may be waiting for multiple results in order to interpret others.  Please give [place] 48 hours in order for your provider to thoroughly review all the results before contacting the office for clarification of your results.

## 2020-05-15 ENCOUNTER — Ambulatory Visit: Payer: Medicare Other | Admitting: Internal Medicine

## 2020-05-15 ENCOUNTER — Encounter: Payer: Self-pay | Admitting: Internal Medicine

## 2020-05-15 NOTE — Progress Notes (Signed)
Subjective:    Patient ID: [name], female    DOB: 1970/12/25, 50 y.o.   MRN: [id number]  HPI Eren Pickerill is a 50 year old female with a history of profound malnutrition with a J-tube in place, GERD with severe esophagitis, prior peptic ulcer disease causing GOO and status post partial gastrectomy with bilateral vagotomy in 2018, history of constipation, interstitial cystitis, hypothyroidism, anxiety and depression who is here for follow-up.  She has been seen most recently on several occasions by Carl Best, NP.  Renee reports today that she simply feels terrible and at times she feels like "I am going to die".  She is having dry heaves at night and really not able to tolerate much by mouth.  She has been trying to eat large meals just before bed but even this does not seem to be working anymore.  Her last meal was Saturday.  She has had the J-tube which is functional but unfortunately has only very recently met with a dietitian to better understand and implement her tube feeding regimen.  Since this appointment she has increased her nocturnal tube feeds to 70 mL/h to achieve 3.5 cans of Osmolite 1.5.  She is also trying to do a bolus feed of one half bottle of the same tube feed formula at midday.  For the last day or so she has been able to do this.  She admits that she would skip tube feeds at night when she works because she seemed to feel worse the next morning.  She reports she has missed a lot of work.  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  She does remain on full disability for her interstitial cystitis.  Bowel movements are rather infrequent but no blood or melena.  She is discouraged because her weight fluctuates but she is down 3 to 4 pounds since being seen by the nutritionist about 3 weeks ago.  She has been trying to take the pantoprazole twice daily.  The Marinol started by [facility] does seem to help.  She  uses promethazine for nausea.  She is trying to avoid NSAIDs though does use some BC powder at times.   Review of Systems As per HPI, otherwise negative  Current Medications, Allergies, Past Medical History, Past Surgical History, Family History and Social History were reviewed in [facility] record.     Objective:   Physical Exam BP 100/70   Pulse 82   Ht 4' 11.5" (1.511 m)   Wt 74 lb 4 oz (33.7 kg)   BMI 14.75 kg/m  Gen: awake, alert, very thin and chronically ill-appearing but in no acute distress HEENT: anicteric CV: RRR, no mrg Pulm: CTA b/l Abd: soft, scaphoid, J-tube in place in the left mid abdomen without induration or edema, positive bowel sounds, diffusely tender without rebound  Ext: no c/c/e Neuro: nonfocal     Assessment & Plan:  50 year old female with a history of profound malnutrition with a J-tube in place, GERD with severe esophagitis, prior peptic ulcer disease causing GOO and status post partial gastrectomy with bilateral vagotomy in 2018, history of constipation, interstitial cystitis, hypothyroidism, anxiety and depression who is here for follow-up.  1.  Severe malnutrition --in the setting of severe reflux and esophagitis, severe gastroparesis due to prior partial gastrectomy and bilateral vagotomy.  She has a J-tube but unfortunately has not been able to achieve the goal tube feeds.  Some of this is due to her not having adequate appointments with nutrition to help her calculate her tube feed needs.  This seems to be in place now and I have encouraged her to use the tube feedings as prescribed to meet the caloric goal.  If she can do so she should be able to slowly gain muscle mass and improve overall mortality.  The recommended weight gain is at a rate of 0.5 to 1 pound per week. -- Osmolite 1.5; 3.5 bottles per night at a rate of 70 mL/h -- Osmolite 1.5; 0.5 bottles per day as a bolus feed between noon and 2 PM -- Avoid solid foods by  mouth given her severe esophagitis and gastroparesis, can have comfort foods and liquids -- I am not convinced that she will be able to achieve this goal despite her effort.  If she continues to struggle we may need to hospitalize her for several days to get her on track. --She will be at risk for refeeding syndrome though this should be minimized with the advised caloric intake per nutrition.  I am going to check her electrolytes today including magnesium and phosphorus. --Continue Marinol at current dose  2.  GERD with severe esophagitis/history of peptic ulcer disease and gastroparesis --see #1.  Also unwilling to change to a liquid PPI hopefully to enhance absorption -- Discontinue pantoprazole -- Begin liquid omeprazole 40 mg twice daily or liquid lansoprazole 30 mg twice daily  3.  Chronic constipation --can use as needed Dulcolax per box instruction for now.  With increasing tube feeds her bowel frequency should improve  She has for a work note and I will provide 1  30 minutes total spent today including patient facing time, coordination of care, reviewing medical history/procedures/pertinent radiology studies, and documentation of the encounter.

## 2020-05-16 ENCOUNTER — Telehealth: Payer: Self-pay | Admitting: Internal Medicine

## 2020-05-16 ENCOUNTER — Telehealth: Payer: Self-pay | Admitting: *Deleted

## 2020-05-16 NOTE — Telephone Encounter (Signed)
See result note.  

## 2020-05-16 NOTE — Telephone Encounter (Signed)
Inbound call from patient requesting lab results. 

## 2020-05-16 NOTE — Telephone Encounter (Signed)
Pharmacy called indicating that patient's insurance will not cover omeprazole or lansoprazole in liquid form. "product not covered" indicating that we cannot even attempt prior authorization. Is patient able to swallow applesauce? Maybe we could provide omeprazole capsules and have her open and mix in applesauce?

## 2020-05-17 NOTE — Telephone Encounter (Signed)
Can try that but she needs to have very limited PO intake given symptoms and esophagitis

## 2020-05-21 NOTE — Telephone Encounter (Signed)
Left message for patient to call back  

## 2020-05-22 MED ORDER — OMEPRAZOLE 40 MG PO CPDR: DELAYED_RELEASE_CAPSULE | ORAL | 2 refills | Status: DC

## 2020-05-22 NOTE — Telephone Encounter (Signed)
Patient return call. Also, have questions about liquid medication and work note. Best contact number [phone]

## 2020-05-22 NOTE — Telephone Encounter (Signed)
I have spoken to patient to advise that due to insurance constraints, we will have to try her on omeprazole capsules. She can open capsule, sprinkle in applesauce and eat. Patient is agreeable to this plan. Rx sent to pharmacy. In addition, I have advised that work note was sent on 05/15/20 to her home address and should be coming at any point to her. If she still does not receive this letter within the next couple of days, she is free to come to the office to pick up a copy or I am glad to resend the letter to her home address. She also verbalizes understanding of this information.

## 2020-06-06 ENCOUNTER — Ambulatory Visit (INDEPENDENT_AMBULATORY_CARE_PROVIDER_SITE_OTHER): Payer: Medicare Other | Admitting: Internal Medicine

## 2020-06-06 ENCOUNTER — Encounter: Payer: Self-pay | Admitting: Internal Medicine

## 2020-06-06 VITALS — BP 88/58 | HR 78 | Ht 59.5 in | Wt 79.2 lb

## 2020-06-06 DIAGNOSIS — R627 Adult failure to thrive: Secondary | ICD-10-CM | POA: Diagnosis not present

## 2020-06-06 DIAGNOSIS — Z903 Acquired absence of stomach [part of]: Secondary | ICD-10-CM

## 2020-06-06 DIAGNOSIS — K2100 Gastro-esophageal reflux disease with esophagitis, without bleeding: Secondary | ICD-10-CM

## 2020-06-06 DIAGNOSIS — E43 Unspecified severe protein-calorie malnutrition: Secondary | ICD-10-CM | POA: Diagnosis not present

## 2020-06-06 MED ORDER — DRONABINOL 2.5 MG PO CAPS: ORAL_CAPSULE | ORAL | 3 refills | Status: DC

## 2020-06-06 MED ORDER — OMEPRAZOLE 40 MG PO CPDR: 40.0000 mg | DELAYED_RELEASE_CAPSULE | Freq: Two times a day (BID) | ORAL | 3 refills | Status: DC

## 2020-06-06 NOTE — Progress Notes (Signed)
Subjective:    Patient ID: [name], female    DOB: [birth year], 50 y.o.   MRN: [id number]  HPI Maegen Muldrew is a 50 year old female with a history of profound malnutrition with G-tube in place, GERD with severe esophagitis, prior PUD causing gastric outlet obstruction and status post partial gastrectomy and bilateral vagotomy in 2018, chronic constipation, interstitial cystitis, hypothyroidism, anxiety and depression who is here for follow-up.  She is here alone today and was last seen on 05/14/2020.  Since last being here she has been adherent to the tube feeding protocol as recommended by nutrition.  With this she has been able to gain 5 pounds in just over 3 weeks.  She is feeling better though certainly not completely well.  She is still trying to eat some food during the day which if she does she will have regurgitation and vomiting and she is also eating some meals at night which occasionally will come back up the next morning.  She changed to omeprazole which she has been opening up for administration but is still having heartburn symptom.  Marinol works very well for her nausea, but it is expensive.  She uses some Zofran and promethazine as well on occasion.  She reports staying hungry nearly all the time.  She has been taking time off of her part-time job at [employer].   Review of Systems As per HPI, otherwise negative  Current Medications, Allergies, Past Medical History, Past Surgical History, Family History and Social History were reviewed in [employer] record.     Objective:   Physical Exam BP (!) 88/58 (BP Location: Right Arm, Patient Position: Sitting, Cuff Size: Normal)   Pulse 78   Ht 4' 11.5" (1.511 m)   Wt 79 lb 4 oz (35.9 kg)   SpO2 98%   BMI 15.74 kg/m  Gen: awake, alert, NAD HEENT: anicteric Abd: soft, NT/ND, +BS throughout, J-tube left middle abdomen Ext: no c/c/e Neuro: nonfocal  CBC    Component Value Date/Time   WBC 10.5  05/14/2020 1537   RBC 4.46 05/14/2020 1537   HGB 12.8 05/14/2020 1537   HGB 7.4 (L) 09/05/2015 1350   HCT 39.4 05/14/2020 1537   HCT 27.1 (L) 09/05/2015 1350   PLT 296.0 05/14/2020 1537   PLT 241 09/05/2015 1350   MCV 88.4 05/14/2020 1537   MCV 71.9 (L) 09/05/2015 1350   MCH 31.0 08/09/2019 1612   MCHC 32.6 05/14/2020 1537   RDW 14.8 05/14/2020 1537   RDW 20.3 (H) 09/05/2015 1350   LYMPHSABS 2.8 05/14/2020 1537   LYMPHSABS 3.0 09/05/2015 1350   MONOABS 1.0 05/14/2020 1537   MONOABS 0.9 09/05/2015 1350   EOSABS 0.1 05/14/2020 1537   EOSABS 0.2 09/05/2015 1350   BASOSABS 0.0 05/14/2020 1537   BASOSABS 0.0 09/05/2015 1350   CMP     Component Value Date/Time   NA 138 05/14/2020 1537   NA 142 09/05/2015 1350   K 4.4 05/14/2020 1537   K 4.0 09/05/2015 1350   CL 105 05/14/2020 1537   CO2 26 05/14/2020 1537   CO2 22 09/05/2015 1350   GLUCOSE 84 05/14/2020 1537   GLUCOSE 87 09/05/2015 1350   BUN 22 05/14/2020 1537   BUN 10.1 09/05/2015 1350   CREATININE 0.79 05/14/2020 1537   CREATININE 0.7 09/05/2015 1350   CALCIUM 9.5 05/14/2020 1537   CALCIUM 8.8 06/17/2017 0719   CALCIUM 8.7 09/05/2015 1350   PROT 6.6 05/14/2020 1537   PROT 6.0 (  L) 09/05/2015 1350   ALBUMIN 4.0 05/14/2020 1537   ALBUMIN 2.8 (L) 09/05/2015 1350   AST 12 05/14/2020 1537   AST 10 09/05/2015 1350   ALT 8 05/14/2020 1537   ALT <9 09/05/2015 1350   ALKPHOS 102 05/14/2020 1537   ALKPHOS 78 09/05/2015 1350   BILITOT 0.3 05/14/2020 1537   BILITOT <0.30 09/05/2015 1350   GFRNONAA >60 08/09/2019 1612   GFRAA >60 08/09/2019 1612   Phos - normal Mg normal  Iron/TIBC/Ferritin/ %Sat    Component Value Date/Time   IRON 53 05/14/2020 1537   IRON <11 (L) 09/05/2015 1350   TIBC 406 09/05/2015 1350   FERRITIN 26.5 05/14/2020 1537   FERRITIN 4 (L) 09/05/2015 1350   IRONPCTSAT 12.0 (L) 05/14/2020 1537   IRONPCTSAT Not calculated due to Iron < linear range. 09/05/2015 1350       Assessment & Plan:   50 year old female with a history of profound malnutrition with G-tube in place, GERD with severe esophagitis, prior PUD causing gastric outlet obstruction and status post partial gastrectomy and bilateral vagotomy in 2018, chronic constipation, interstitial cystitis, hypothyroidism, anxiety and depression who is here for follow-up.  1.  Severe malnutrition --in the setting of severe reflux and esophagitis with severe gastroparesis due to partial gastrectomy and vagotomy. -- We are making progress here as she has been able to gain 5 pounds and she is feeling better.  Certainly a long way to go with improvement in nutrition but certainly progress.  We discussed this today and I encouraged her to continue tube feeding regimen as follows and I am also going to have her increase her boluses during the day: --Osmolite 1.5; 3.5 bottles per night at a rate of 70 mL/h -- Osmolite 1.5; 0.5 bottles per day, do this now twice daily (which is an increase from once daily) --avoid solid foods by mouth given severe esophagitis and gastroparesis as well as vomiting -- Continue Marinol at current dose; can use ondansetron and promethazine if needed for refractory nausea or vomiting  2.  GERD with severe esophagitis/history of PUD and gastroparesis --still some heartburn symptoms.  I think she should avoid no solid foods as she simply has very poor esophageal clearance and gastroparesis. -- Tube feeds as above -- Increase omeprazole to 40 mg twice daily; open capsules and sprinkle with applesauce or pudding for administration  3.  Chronic constipation --no longer an issue with using tube feedings as required  I would like her to follow-up with nutrition again to ensure that she is on the right formulation as she gains weight I would like to see her in 6 to 8 weeks I told her she should remain out of work for now  20 minutes total spent today including patient facing time, coordination of care, reviewing medical  history/procedures/pertinent radiology studies, and documentation of the encounter.

## 2020-06-06 NOTE — Patient Instructions (Addendum)
If you are age 48 or older, your body mass index should be between 23-30. Your Body mass index is 15.74 kg/m. If this is out of the aforementioned range listed, please consider follow up with your Primary Care Provider.  If you are age 32 or younger, your body mass index should be between 19-25. Your Body mass index is 15.74 kg/m. If this is out of the aformentioned range listed, please consider follow up with your Primary Care Provider.   __________________________________________________________  The [facility] providers would like to encourage you to use [facility] to communicate with providers for non-urgent requests or questions.  Due to long hold times on the telephone, sending your provider a message by [facility] may be a faster and more efficient way to get a response.  Please allow 48 business hours for a response.  Please remember that this is for non-urgent requests.   Increase your omeprazole to 40 mg TWICE daily.  Continue Marinol.  Continue phenergan and zofran as needed.  Continue tube feedings: Same regime at night but increase to 2 bolus feedings during the day.  Please follow up on 8/16/222 at 9:10am  Thank you for entrusting me with your care and for choosing [facility], Dr. Zenovia Jarred

## 2020-06-20 ENCOUNTER — Inpatient Hospital Stay: Admission: RE | Admit: 2020-06-20 | Payer: Medicare Other | Source: Ambulatory Visit

## 2020-07-18 ENCOUNTER — Other Ambulatory Visit (HOSPITAL_COMMUNITY): Payer: Self-pay | Admitting: Surgery

## 2020-07-18 DIAGNOSIS — Z934 Other artificial openings of gastrointestinal tract status: Secondary | ICD-10-CM

## 2020-07-23 ENCOUNTER — Ambulatory Visit (HOSPITAL_COMMUNITY)
Admission: RE | Admit: 2020-07-23 | Discharge: 2020-07-23 | Disposition: A | Discharge status: Home / Self Care | Payer: Medicare Other | Source: Ambulatory Visit | Attending: Surgery | Admitting: Surgery

## 2020-07-23 ENCOUNTER — Other Ambulatory Visit: Payer: Self-pay

## 2020-07-23 DIAGNOSIS — Z934 Other artificial openings of gastrointestinal tract status: Secondary | ICD-10-CM | POA: Diagnosis present

## 2020-07-23 HISTORY — PX: IR REPLC DUODEN/JEJUNO TUBE PERCUT W/FLUORO: IMG2334

## 2020-07-23 MED ORDER — LIDOCAINE VISCOUS HCL 2 % MT SOLN
OROMUCOSAL | Status: AC
  Filled 2020-07-23: qty 15

## 2020-07-23 MED ORDER — IOHEXOL 300 MG/ML  SOLN
10.0000 mL | Freq: Once | INTRAMUSCULAR | Status: AC | PRN
  Administered 2020-07-23: 8 mL

## 2020-07-26 ENCOUNTER — Other Ambulatory Visit (HOSPITAL_COMMUNITY): Payer: Medicare Other

## 2020-08-19 DIAGNOSIS — I10 Essential (primary) hypertension: Secondary | ICD-10-CM | POA: Diagnosis present

## 2020-08-27 ENCOUNTER — Encounter: Payer: Self-pay | Admitting: Internal Medicine

## 2020-08-27 ENCOUNTER — Ambulatory Visit (INDEPENDENT_AMBULATORY_CARE_PROVIDER_SITE_OTHER): Payer: Medicare Other | Admitting: Internal Medicine

## 2020-08-27 VITALS — BP 102/64 | HR 84 | Ht 59.5 in | Wt 80.2 lb

## 2020-08-27 DIAGNOSIS — F339 Major depressive disorder, recurrent, unspecified: Secondary | ICD-10-CM | POA: Diagnosis not present

## 2020-08-27 DIAGNOSIS — E440 Moderate protein-calorie malnutrition: Secondary | ICD-10-CM | POA: Diagnosis not present

## 2020-08-27 DIAGNOSIS — K911 Postgastric surgery syndromes: Secondary | ICD-10-CM

## 2020-08-27 DIAGNOSIS — K3184 Gastroparesis: Secondary | ICD-10-CM | POA: Diagnosis not present

## 2020-08-27 DIAGNOSIS — K2100 Gastro-esophageal reflux disease with esophagitis, without bleeding: Secondary | ICD-10-CM

## 2020-08-27 MED ORDER — ONDANSETRON 4 MG PO TBDP: 4.0000 mg | ORAL_TABLET | Freq: Four times a day (QID) | ORAL | 1 refills | Status: DC | PRN

## 2020-08-27 MED ORDER — OMEPRAZOLE 40 MG PO CPDR: 40.0000 mg | DELAYED_RELEASE_CAPSULE | Freq: Two times a day (BID) | ORAL | 3 refills | Status: DC

## 2020-08-27 MED ORDER — PROMETHAZINE HCL 25 MG PO TABS
25.0000 mg | ORAL_TABLET | Freq: Three times a day (TID) | ORAL | 1 refills | Status: DC | PRN
Start: 2020-08-27 — End: 2022-07-05

## 2020-08-27 NOTE — Patient Instructions (Signed)
We have sent the following medications to your pharmacy for you to pick up at your convenience: omeprazole, zofran and phenergan.   Stay on current schedule of 2 feedings daily.   Please try to get daily bolus feedings in daily.   Thank you for entrusting me with your care and for choosing [facility], Dr. Zenovia Jarred

## 2020-08-27 NOTE — Progress Notes (Signed)
Subjective:    Patient ID: [name], female    DOB: 1970/06/13, 50 y.o.   MRN: [id number]  HPI Kyrin Ketcherside is a 50 year old female with a history of malnutrition with J-tube in place, GERD with severe esophagitis, prior peptic ulcer disease causing gastric outlet obstruction status post partial gastrectomy and bilateral vagotomy in 2018, postsurgical gastroparesis, chronic constipation, interstitial cystitis, hypothyroidism, depression who is here for follow-up.  She is here alone today and was last seen on 06/06/2020.  She reports that recently her depression has been significant.  She reports that she at times "feels like giving up".  She states at times she will not feed using her feeding tube because of her poor quality of life.  She is using tube feeds nocturnally most days and is intermittently taking the bolus feedings during the day.  She is still trying to eat at night regular food.  She reports that if she gets less than 6 hours of sleep she will have sickness and vomiting when she wakes up.  If she is able to sleep for 7+ hours she does not have vomiting in the morning.  She is using her omeprazole 20 mg twice daily and opening the capsules for administration.  She is having some nausea and will intermittently use Zofran and Phenergan.  She is having right rib pain which she attributes to a work injury.  She does not have a psychiatrist or psychologist.  She states she does not do well talking about her depression.  Her primary care office manages her depression and she is currently using buspirone, quetiapine and trazodone.  She states these are the only medications that have "ever worked for me".  She has gone back to work at [employer].   Review of Systems As per HPI, otherwise negative  Current Medications, Allergies, Past Medical History, Past Surgical History, Family History and Social History were reviewed in [employer] record.    Objective:    Physical Exam BP 102/64   Pulse 84   Ht 4' 11.5" (1.511 m)   Wt 80 lb 3.2 oz (36.4 kg)   BMI 15.93 kg/m  Gen: awake, alert, NAD HEENT: anicteric, op clear CV: RRR, no mrg, pinpoint pain in the right mid axillary line over the lower rib cage Pulm: CTA b/l Abd: soft, NT/ND, +BS throughout, feeding tube intact Ext: no c/c/e Neuro: nonfocal      Assessment & Plan:  50 year old female with a history of malnutrition with J-tube in place, GERD with severe esophagitis, prior peptic ulcer disease causing gastric outlet obstruction status post partial gastrectomy and bilateral vagotomy in 2018, postsurgical gastroparesis, chronic constipation, interstitial cystitis, hypothyroidism, depression who is here for follow-up.    Malnutrition --in setting of severe reflux with esophagitis, postsurgical gastroparesis.  J-tube is in place and her weight has been stable but she has not gained weight.  I am concerned that her depression is contributing to her nonadherence to the previous feeding tube regimen.  Fortunately her weight is stable and up about 1 pound in 3 months. --Continue Osmolite 1.5; 3.5 bottles per night at a rate of 70 mL/h --I encouraged her to resume the Osmolite 1.5 0.5 bottles per day as bolus feedings, this can be done twice daily  2.  GERD with esophagitis/postsurgical gastroparesis/nausea --when she gets less than adequate sleep her nighttime meal does not have time to adequately empty and she will wake up with vomiting.  Ideally she would use  her feeding tube only but she does like eating for comfort.   -- Continue omeprazole 40 mg twice daily; open capsules and administer with applesauce --Ondansetron 4 mg every 6 hours as needed nausea --Promethazine 12.5 to 25 mg every 8 hours as needed for refractory nausea --Marinol definitively helpful but caused prohibitive at this point, she can continue current dose when she can afford it. --EGD recommended to evaluate status of her  esophagitis but she cannot afford this at this time  3.  Depression --significant issue now and contributing to her malnutrition as she is not adherent with her tube feedings as recommended above.  She is missing meals which is certainly worsening overall health.  We discussed how these 2 things are intricately tied together between her physical and mental health.  I offered psychology or psychiatry referral but she does not wish to pursue this.  I will send a note to her primary care regarding this issue to see if therapy can be altered or changed. --I called Cyndi Bender, PAC at [facility] regarding this issue. --She is not on trazodone now, but mirtazapine 30 mg  --Saw PCP yesterday but more about back pain, they will reach out to her for follow-up regarding depression --I appreciate their help here.  4.  History of chronic constipation --less of an issue with tube feedings.  45-monthfollow-up with me  30 minutes total spent today including patient facing time, coordination of care, reviewing medical history/procedures/pertinent radiology studies, and documentation of the encounter.

## 2020-09-23 ENCOUNTER — Other Ambulatory Visit (HOSPITAL_COMMUNITY): Payer: Self-pay | Admitting: Surgery

## 2020-09-23 ENCOUNTER — Ambulatory Visit (HOSPITAL_COMMUNITY)
Admission: RE | Admit: 2020-09-23 | Discharge: 2020-09-23 | Disposition: A | Discharge status: Home / Self Care | Payer: Medicare Other | Source: Ambulatory Visit | Attending: Surgery | Admitting: Surgery

## 2020-09-23 ENCOUNTER — Other Ambulatory Visit: Payer: Self-pay

## 2020-09-23 DIAGNOSIS — Z434 Encounter for attention to other artificial openings of digestive tract: Secondary | ICD-10-CM | POA: Diagnosis not present

## 2020-09-23 DIAGNOSIS — Z934 Other artificial openings of gastrointestinal tract status: Secondary | ICD-10-CM

## 2020-09-23 HISTORY — PX: IR REPLC DUODEN/JEJUNO TUBE PERCUT W/FLUORO: IMG2334

## 2020-09-23 HISTORY — PX: IR GJ TUBE CHANGE: IMG1440

## 2020-09-23 MED ORDER — LIDOCAINE VISCOUS HCL 2 % MT SOLN
OROMUCOSAL | Status: AC
  Filled 2020-09-23: qty 15

## 2020-09-23 MED ORDER — IOHEXOL 240 MG/ML SOLN
50.0000 mL | Freq: Once | INTRAMUSCULAR | Status: AC | PRN
  Administered 2020-09-23: 20 mL via INTRAVENOUS

## 2020-09-23 MED ORDER — LIDOCAINE VISCOUS HCL 2 % MT SOLN
OROMUCOSAL | Status: DC | PRN
  Administered 2020-09-23: 15 mL via OROMUCOSAL

## 2020-09-24 ENCOUNTER — Encounter (HOSPITAL_COMMUNITY): Payer: Self-pay

## 2020-09-24 ENCOUNTER — Other Ambulatory Visit (HOSPITAL_COMMUNITY): Payer: Self-pay | Admitting: Surgery

## 2020-09-24 DIAGNOSIS — Z934 Other artificial openings of gastrointestinal tract status: Secondary | ICD-10-CM

## 2020-11-08 ENCOUNTER — Telehealth: Payer: Self-pay | Admitting: Internal Medicine

## 2020-11-08 NOTE — Telephone Encounter (Signed)
Spoke with pt  and scheduled her to see Dr. Hilarie Fredrickson 12/10/20@1 :30pm. Pt aware of appt. Pt reports she is doing the tube feedings every night and she is not gaining weight. Pt states she weighs 81lbs. Pts last weight in the office was 80lb.

## 2020-11-08 NOTE — Telephone Encounter (Signed)
Patient called and said she was supposed to have been scheduled an appointment in November for Dr. Hilarie Fredrickson as she has to see him regarding her feeding tube.  There are no appointments until January.  Please call patient and advise.

## 2020-12-09 ENCOUNTER — Other Ambulatory Visit: Payer: Self-pay | Admitting: Internal Medicine

## 2020-12-09 NOTE — Telephone Encounter (Signed)
Patient called this morning stating she was completely out of the Marinol and needs it refilled today, if possible.  She has an appointment with Dr. Hilarie Fredrickson tomorrow, so if she can get enough to last until that appointment, she would appreciate it.  Can you please call her and let her know if you are able to fill it?  Thank you.

## 2020-12-10 ENCOUNTER — Encounter: Payer: Self-pay | Admitting: Internal Medicine

## 2020-12-10 ENCOUNTER — Ambulatory Visit (INDEPENDENT_AMBULATORY_CARE_PROVIDER_SITE_OTHER): Payer: Medicare Other | Admitting: Internal Medicine

## 2020-12-10 VITALS — BP 96/68 | HR 88 | Ht 59.0 in | Wt 77.0 lb

## 2020-12-10 DIAGNOSIS — K209 Esophagitis, unspecified without bleeding: Secondary | ICD-10-CM | POA: Diagnosis not present

## 2020-12-10 DIAGNOSIS — Z934 Other artificial openings of gastrointestinal tract status: Secondary | ICD-10-CM

## 2020-12-10 DIAGNOSIS — E440 Moderate protein-calorie malnutrition: Secondary | ICD-10-CM | POA: Diagnosis not present

## 2020-12-10 DIAGNOSIS — Z903 Acquired absence of stomach [part of]: Secondary | ICD-10-CM | POA: Diagnosis not present

## 2020-12-10 DIAGNOSIS — K3184 Gastroparesis: Secondary | ICD-10-CM | POA: Diagnosis not present

## 2020-12-10 DIAGNOSIS — K911 Postgastric surgery syndromes: Secondary | ICD-10-CM

## 2020-12-10 NOTE — Progress Notes (Signed)
Subjective:    Patient ID: [name], female    DOB: June 16, 1970, 50 y.o.   MRN: [id number]  HPI [name] is a 50 year old female with a history of malnutrition with J-tube in place, history of severe peptic ulcer disease causing gastric outlet obstruction and leading to partial gastrectomy and bilateral vagotomy in 2018, severe postsurgical gastroparesis, chronic constipation, ICD, hypothyroidism and depression who is here for follow-up.  She was last seen on 08/27/2020.  She continues to feel hopeless and struggles on a daily basis with her nutrition.  She is unable to eat anything during the day because she will have vomiting.  She is still trying to eat late at night and then lie down for bed.  She typically will eat cheeseburgers from fast food restaurants around 1030 each evening and then lie down.  Sometimes in the morning she will still have vomiting of what she ate the night before.  She reports adherence with her nocturnal tube feeds usually 2-1/2 cans of Osmolite 1.5 overnight and 0.5 cans as a bolus feeding during the day.  She only boluses 2 to 3 days/week.  She is trying to work and therefore boluses cumbersome.  She reports being tired of feeding herself through the tube.  She reports using her omeprazole 40 twice daily.  Marinol is definitively helpful for her nausea and without it she is nearly miserable.  This is expensive but she is paying $45 every 10 days for 3 times a day Marinol.  Her constipation has been significant of late going less than once weekly.  She is using Dulcolax.  Oxycodone has been started for chronic back pain.  Review of Systems As per HPI, otherwise negative  Current Medications, Allergies, Past Medical History, Past Surgical History, Family History and Social History were reviewed in [employer] record.    Objective:   Physical Exam BP 96/68   Pulse 88   Ht 4\' 11"  (1.499 m)   Wt 77 lb (34.9 kg)   BMI 15.55 kg/m  Gen:  awake, alert, NAD HEENT: anicteric CV: RRR, no mrg Pulm: CTA b/l Abd: soft, thin, feeding tube in place left middle abdomen , dressing is clean dry and intact around the tube, NT/ND, +BS throughout Ext: no c/c/e Neuro: nonfocal     Assessment & Plan:  50 year old female with a history of malnutrition with J-tube in place, history of severe peptic ulcer disease causing gastric outlet obstruction and leading to partial gastrectomy and bilateral vagotomy in 2018, severe postsurgical gastroparesis, chronic constipation, ICD, hypothyroidism and depression who is here for follow-up.   Malnutrition with J-tube in place/history of severe likely NSAID induced PUD leading to gastric outlet obstruction/status post partial gastrectomy and bilateral vagotomy/severe postsurgical gastroparesis/esophagitis likely due to gastroparesis--very very difficult situation for Joslyne.  She dislikes her feeding tube but understands that currently it is essential for her life.  She simply cannot get enough calories and by mouth to not remain catabolic.  She asks if there are any other options.  She fears that she will simply not be able to keep going this way. At this point I am unsure what other options we have.  My hope would be that she would increase her feedings per night to get to 3.5 cans per night and then bolus feed 1 can/day.  She is under this goal currently and does not feel that she can tolerate additional feedings or a higher rate overnight.  This likely explains her ongoing  weight loss and failure to overall improved.  She had been making progress but she appears to be catabolic again likely due to intake less than goal.  I discussed whether there would be surgical options for her.  Would a diverting more proximal gastrojejunostomy help with her severe gastroparesis.  I am going to see if Dr. Redmond Pulling with [facility] can see her to consider any surgical options for her.  If this were possible perhaps we  could get rid of her J-tube.  -- Continue omeprazole 40 mg twice daily --Marinol at current dose 3 times daily --Can use ondansetron and promethazine as prescribed for refractory nausea --We considered EGD though she is concerned about cost.  We will see if she has surgical opinion and they may want [place] to perform an EGD prior to any operation.  40 minutes total spent today including patient facing time, coordination of care, reviewing medical history/procedures/pertinent radiology studies, and documentation of the encounter.

## 2020-12-10 NOTE — Patient Instructions (Signed)
Start Miralax -dissolve 17 grams( 1 capful) in  at least 8 ounces of water daily.   Continue Omeprazole twice daily as directed.   You have been referred to [facility]-Dr Greer Pickerel. Make certain to bring a list of current medications, including any over the counter medications or vitamins. Also bring your co-pay if you have one as well as your insurance cards. [facility] is located at [address]. Please contact them at [phone] if you have not heard from their office in 1-2 weeks.  If you are age 48 or younger, your body mass index should be between 19-25. Your Body mass index is 15.55 kg/m. If this is out of the aformentioned range listed, please consider follow up with your Primary Care Provider.   ________________________________________________________  The [facility] providers would like to encourage you to use [facility] to communicate with providers for non-urgent requests or questions.  Due to long hold times on the telephone, sending your provider a message by [facility] may be a faster and more efficient way to get a response.  Please allow 48 business hours for a response.  Please remember that this is for non-urgent requests.  Thank you for choosing me and [facility].  Dr. Hilarie Fredrickson

## 2020-12-14 ENCOUNTER — Other Ambulatory Visit: Payer: Self-pay | Admitting: Internal Medicine

## 2020-12-19 ENCOUNTER — Other Ambulatory Visit (HOSPITAL_COMMUNITY): Payer: Self-pay | Admitting: Surgery

## 2020-12-19 DIAGNOSIS — Z934 Other artificial openings of gastrointestinal tract status: Secondary | ICD-10-CM

## 2020-12-23 ENCOUNTER — Other Ambulatory Visit (HOSPITAL_COMMUNITY): Payer: Medicare Other

## 2020-12-26 ENCOUNTER — Telehealth: Payer: Self-pay | Admitting: Internal Medicine

## 2020-12-26 NOTE — Telephone Encounter (Signed)
Spoke with Lilia Pro at [facility]. She said the surgeons are currently reviewing the patient's case and they will let her know what is decided; whether they will do the surgery or if they will refer her somewhere else. Called patient to let her know. Pt verbalized understanding and had no concerns at end of call.

## 2020-12-26 NOTE — Telephone Encounter (Signed)
Spoke with patient and let her know that I called [facility] and left a voicemail. Told pt I would let her know what I found out.

## 2020-12-26 NOTE — Telephone Encounter (Signed)
Inbound call from patient. States she have tried to contact [facility] for her referral and have not heard back

## 2020-12-27 ENCOUNTER — Other Ambulatory Visit (HOSPITAL_COMMUNITY): Payer: Self-pay | Admitting: Surgery

## 2020-12-27 ENCOUNTER — Other Ambulatory Visit: Payer: Self-pay

## 2020-12-27 ENCOUNTER — Ambulatory Visit (HOSPITAL_COMMUNITY)
Admission: RE | Admit: 2020-12-27 | Discharge: 2020-12-27 | Disposition: A | Discharge status: Home / Self Care | Payer: Medicare Other | Source: Ambulatory Visit | Attending: Surgery | Admitting: Surgery

## 2020-12-27 DIAGNOSIS — Z434 Encounter for attention to other artificial openings of digestive tract: Secondary | ICD-10-CM | POA: Diagnosis present

## 2020-12-27 DIAGNOSIS — Z934 Other artificial openings of gastrointestinal tract status: Secondary | ICD-10-CM

## 2020-12-27 HISTORY — PX: IR REPLC DUODEN/JEJUNO TUBE PERCUT W/FLUORO: IMG2334

## 2020-12-27 MED ORDER — IOHEXOL 300 MG/ML  SOLN
10.0000 mL | Freq: Once | INTRAMUSCULAR | Status: AC | PRN
  Administered 2020-12-27: 10 mL

## 2020-12-27 MED ORDER — LIDOCAINE HCL 1 % IJ SOLN
INTRAMUSCULAR | Status: AC
  Filled 2020-12-27: qty 20

## 2020-12-27 MED ORDER — LIDOCAINE HCL 1 % IJ SOLN
INTRAMUSCULAR | Status: DC | PRN
  Administered 2020-12-27: 5 mL

## 2020-12-30 ENCOUNTER — Other Ambulatory Visit: Payer: Self-pay

## 2020-12-30 ENCOUNTER — Other Ambulatory Visit (HOSPITAL_COMMUNITY): Payer: Self-pay | Admitting: Surgery

## 2020-12-30 ENCOUNTER — Ambulatory Visit (HOSPITAL_COMMUNITY)
Admission: RE | Admit: 2020-12-30 | Discharge: 2020-12-30 | Disposition: A | Discharge status: Home / Self Care | Payer: Medicare Other | Source: Ambulatory Visit | Attending: Surgery | Admitting: Surgery

## 2020-12-30 DIAGNOSIS — T85598A Other mechanical complication of other gastrointestinal prosthetic devices, implants and grafts, initial encounter: Secondary | ICD-10-CM | POA: Diagnosis present

## 2020-12-30 DIAGNOSIS — Y848 Other medical procedures as the cause of abnormal reaction of the patient, or of later complication, without mention of misadventure at the time of the procedure: Secondary | ICD-10-CM | POA: Diagnosis not present

## 2020-12-30 HISTORY — PX: IR CM INJ ANY COLONIC TUBE W/FLUORO: IMG2336

## 2020-12-30 NOTE — Progress Notes (Signed)
Patient is s/p J tube replacement on 12/27/20.   Patient presents to [facility] IR today for leakage from the J tube.   3 mL of NS was aspirated from the retention balloon, contrast was injected which showed contrast in the small bowel.  The J tube was flushed with 5 mL NS.  Patient was to be discharged home, however, a leakage from the end-port site noted.  Dr. Kathlene Cote called to the IR room for eval, the end port cap might be defected.  Placed an adaptor to the end-port, no further leakage noted.   Patient was encouraged to contact IR for further questions and concerns.  Patent verbalized understanding.   Armando Gang Carlosdaniel Grob PA-C 12/30/2020 11:29 AM

## 2021-01-02 ENCOUNTER — Emergency Department (HOSPITAL_COMMUNITY): Payer: Medicare Other

## 2021-01-02 ENCOUNTER — Inpatient Hospital Stay (HOSPITAL_COMMUNITY)
Admission: EM | Admit: 2021-01-02 | Discharge: 2021-01-05 | DRG: 196 | Disposition: A | Discharge status: Home / Self Care | Payer: Medicare Other | Attending: Internal Medicine | Admitting: Internal Medicine

## 2021-01-02 DIAGNOSIS — Z8711 Personal history of peptic ulcer disease: Secondary | ICD-10-CM

## 2021-01-02 DIAGNOSIS — D649 Anemia, unspecified: Secondary | ICD-10-CM | POA: Diagnosis present

## 2021-01-02 DIAGNOSIS — K5900 Constipation, unspecified: Secondary | ICD-10-CM | POA: Diagnosis present

## 2021-01-02 DIAGNOSIS — G8929 Other chronic pain: Secondary | ICD-10-CM | POA: Diagnosis present

## 2021-01-02 DIAGNOSIS — Z20822 Contact with and (suspected) exposure to covid-19: Secondary | ICD-10-CM | POA: Diagnosis present

## 2021-01-02 DIAGNOSIS — R0902 Hypoxemia: Secondary | ICD-10-CM | POA: Diagnosis not present

## 2021-01-02 DIAGNOSIS — E039 Hypothyroidism, unspecified: Secondary | ICD-10-CM | POA: Diagnosis present

## 2021-01-02 DIAGNOSIS — J849 Interstitial pulmonary disease, unspecified: Secondary | ICD-10-CM | POA: Diagnosis not present

## 2021-01-02 DIAGNOSIS — Z882 Allergy status to sulfonamides status: Secondary | ICD-10-CM

## 2021-01-02 DIAGNOSIS — K311 Adult hypertrophic pyloric stenosis: Secondary | ICD-10-CM | POA: Diagnosis present

## 2021-01-02 DIAGNOSIS — K3184 Gastroparesis: Secondary | ICD-10-CM | POA: Diagnosis present

## 2021-01-02 DIAGNOSIS — R109 Unspecified abdominal pain: Secondary | ICD-10-CM

## 2021-01-02 DIAGNOSIS — Z885 Allergy status to narcotic agent status: Secondary | ICD-10-CM

## 2021-01-02 DIAGNOSIS — Z79899 Other long term (current) drug therapy: Secondary | ICD-10-CM

## 2021-01-02 DIAGNOSIS — R1311 Dysphagia, oral phase: Secondary | ICD-10-CM | POA: Diagnosis present

## 2021-01-02 DIAGNOSIS — K224 Dyskinesia of esophagus: Secondary | ICD-10-CM | POA: Diagnosis present

## 2021-01-02 DIAGNOSIS — F17210 Nicotine dependence, cigarettes, uncomplicated: Secondary | ICD-10-CM | POA: Diagnosis present

## 2021-01-02 DIAGNOSIS — J9601 Acute respiratory failure with hypoxia: Secondary | ICD-10-CM | POA: Diagnosis present

## 2021-01-02 DIAGNOSIS — K219 Gastro-esophageal reflux disease without esophagitis: Secondary | ICD-10-CM | POA: Diagnosis present

## 2021-01-02 DIAGNOSIS — E43 Unspecified severe protein-calorie malnutrition: Secondary | ICD-10-CM | POA: Diagnosis present

## 2021-01-02 DIAGNOSIS — Z9049 Acquired absence of other specified parts of digestive tract: Secondary | ICD-10-CM

## 2021-01-02 DIAGNOSIS — Z681 Body mass index (BMI) 19 or less, adult: Secondary | ICD-10-CM

## 2021-01-02 DIAGNOSIS — Z931 Gastrostomy status: Secondary | ICD-10-CM

## 2021-01-02 DIAGNOSIS — M549 Dorsalgia, unspecified: Secondary | ICD-10-CM | POA: Diagnosis present

## 2021-01-02 DIAGNOSIS — F419 Anxiety disorder, unspecified: Secondary | ICD-10-CM | POA: Diagnosis present

## 2021-01-02 DIAGNOSIS — Z903 Acquired absence of stomach [part of]: Secondary | ICD-10-CM

## 2021-01-02 DIAGNOSIS — J189 Pneumonia, unspecified organism: Secondary | ICD-10-CM

## 2021-01-02 DIAGNOSIS — E870 Hyperosmolality and hypernatremia: Secondary | ICD-10-CM | POA: Diagnosis present

## 2021-01-02 HISTORY — DX: Anxiety disorder, unspecified: F41.9

## 2021-01-02 NOTE — ED Provider Notes (Signed)
[facility] EMERGENCY DEPARTMENT Provider Note   CSN: [id number] Arrival date & time: 01/02/21  2246     History Chief Complaint  Patient presents with   Shortness of Breath    Pt arrives with [facility] from home; pt's father called 911 because pt's O2 sat was 79 on room air. Pt has feeding tube and was "in and out of somnolence" upon EMS arrival. Pt's brother stated pt's O2 sat was "in the 40's" earlier in the day. Pt was positive for the flu today per EMS. Pt placed on 15L NRB by EMS and given 800 ml NS.  EMS vitals: 130/80 BP 90 HR     [name] is a 50 y.o. female.  Patient is a 50 year old female with past medical history of peptic ulcer disease with surgical resection and aperistalsis of the stomach.  Patient has an indwelling feeding tube and has caloric deficiency and malnutrition.  She presents today for evaluation of shortness of breath.  She reports feeling poorly for the past 2 days with cough.  She was seen at the doctor's office this morning and was told she had influenza.  She was discharged to home.  This evening she became more short of breath.  EMS was called and her saturations were in the 60s.  She does have a history of tobacco use and tells me she continues to smoke 1 pack of cigarettes per day.  Patient placed on nonrebreather and transported here.  Patient is somewhat of a poor historian and has limited insight into her medical issues.    The history is provided by the patient.  Shortness of Breath Severity:  Moderate Onset quality:  Sudden Duration:  2 days Timing:  Constant Progression:  Worsening Chronicity:  New Context: URI   Relieved by:  Nothing Worsened by:  Nothing Ineffective treatments:  None tried Associated symptoms: cough   Associated symptoms: no fever       No past medical history on file.  There are no problems to display for this patient.      OB History   No obstetric history on file.     No  family history on file.     Home Medications Prior to Admission medications   Not on File    Allergies    Patient has no allergy information on record.  Review of Systems   Review of Systems  Constitutional:  Negative for fever.  Respiratory:  Positive for cough and shortness of breath.   All other systems reviewed and are negative.  Physical Exam Updated Vital Signs BP 139/87 (BP Location: Right Arm)    Pulse 92    Temp 98 F (36.7 C) (Oral)    Resp 19    SpO2 97%   Physical Exam Vitals and nursing note reviewed.  Constitutional:      General: She is not in acute distress.    Appearance: She is not diaphoretic.     Comments: Patient is cachectic and appears older than stated age.  She is in no acute distress.  HENT:     Head: Normocephalic and atraumatic.  Cardiovascular:     Rate and Rhythm: Normal rate and regular rhythm.     Heart sounds: No murmur heard.   No friction rub. No gallop.  Pulmonary:     Effort: Pulmonary effort is normal. No respiratory distress.     Breath sounds: Examination of the right-middle field reveals rhonchi. Examination of the left-middle field reveals  rhonchi. Rhonchi present. No wheezing.  Abdominal:     General: Bowel sounds are normal. There is no distension.     Palpations: Abdomen is soft.     Tenderness: There is no abdominal tenderness.  Musculoskeletal:        General: Normal range of motion.     Cervical back: Normal range of motion and neck supple.     Right lower leg: No tenderness. No edema.     Left lower leg: No tenderness. No edema.  Skin:    General: Skin is warm and dry.  Neurological:     General: No focal deficit present.     Mental Status: She is alert and oriented to person, place, and time.    ED Results / Procedures / Treatments   Labs (all labs ordered are listed, but only abnormal results are displayed) Labs Reviewed  RESP PANEL BY RT-PCR (FLU A&B, COVID) [id number]  COMPREHENSIVE METABOLIC PANEL  CBC WITH  DIFFERENTIAL/PLATELET  BRAIN NATRIURETIC PEPTIDE  TROPONIN I (HIGH SENSITIVITY)    EKG None  Radiology No results found.  Procedures Procedures   Medications Ordered in ED Medications - No data to display  ED Course  I have reviewed the triage vital signs and the nursing notes.  Pertinent labs & imaging results that were available during my care of the patient were reviewed by me and considered in my medical decision making (see chart for details).    MDM Rules/Calculators/A&P  Patient presenting here with complaints of shortness of breath and URI symptoms.  She was told earlier today that she had influenza by her primary doctor, however her influenza and COVID swab here are both negative.  She was brought here by EMS with saturations in the 60s and on supplemental oxygen by nonrebreather.  Oxygen was changed to 6 L by nasal cannula after she became hypoxic into the 70s on room air.  Patient's work-up shows groundglass appearance to both lungs, progressive from prior studies.  I have uncertain as to the etiology of this, but CHF and infectious process are on the differential.  Patient given Rocephin and Zithromax as well as a 20 mg dose of Lasix.  Care discussed with Dr. Hal Hope from the hospitalist service who will admit.  CRITICAL CARE Performed by: Veryl Speak Total critical care time: 35 minutes Critical care time was exclusive of separately billable procedures and treating other patients. Critical care was necessary to treat or prevent imminent or life-threatening deterioration. Critical care was time spent personally by me on the following activities: development of treatment plan with patient and/or surrogate as well as nursing, discussions with consultants, evaluation of patient's response to treatment, examination of patient, obtaining history from patient or surrogate, ordering and performing treatments and interventions, ordering and review of laboratory studies,  ordering and review of radiographic studies, pulse oximetry and re-evaluation of patient's condition.   Final Clinical Impression(s) / ED Diagnoses Final diagnoses:  None    Rx / DC Orders ED Discharge Orders     None        Veryl Speak, MD 01/03/21 [phone]

## 2021-01-03 ENCOUNTER — Inpatient Hospital Stay (HOSPITAL_COMMUNITY): Payer: Medicare Other

## 2021-01-03 ENCOUNTER — Other Ambulatory Visit: Payer: Self-pay

## 2021-01-03 ENCOUNTER — Encounter (HOSPITAL_COMMUNITY): Payer: Self-pay | Admitting: Internal Medicine

## 2021-01-03 ENCOUNTER — Emergency Department (HOSPITAL_COMMUNITY): Payer: Medicare Other

## 2021-01-03 DIAGNOSIS — J189 Pneumonia, unspecified organism: Secondary | ICD-10-CM | POA: Insufficient documentation

## 2021-01-03 DIAGNOSIS — K311 Adult hypertrophic pyloric stenosis: Secondary | ICD-10-CM | POA: Diagnosis present

## 2021-01-03 DIAGNOSIS — E43 Unspecified severe protein-calorie malnutrition: Secondary | ICD-10-CM

## 2021-01-03 DIAGNOSIS — D649 Anemia, unspecified: Secondary | ICD-10-CM | POA: Diagnosis present

## 2021-01-03 DIAGNOSIS — R0609 Other forms of dyspnea: Secondary | ICD-10-CM

## 2021-01-03 DIAGNOSIS — J9601 Acute respiratory failure with hypoxia: Secondary | ICD-10-CM

## 2021-01-03 DIAGNOSIS — E870 Hyperosmolality and hypernatremia: Secondary | ICD-10-CM | POA: Diagnosis present

## 2021-01-03 DIAGNOSIS — R0902 Hypoxemia: Secondary | ICD-10-CM

## 2021-01-03 DIAGNOSIS — R1311 Dysphagia, oral phase: Secondary | ICD-10-CM | POA: Diagnosis present

## 2021-01-03 DIAGNOSIS — F17210 Nicotine dependence, cigarettes, uncomplicated: Secondary | ICD-10-CM | POA: Diagnosis present

## 2021-01-03 DIAGNOSIS — R109 Unspecified abdominal pain: Secondary | ICD-10-CM | POA: Diagnosis not present

## 2021-01-03 DIAGNOSIS — Z882 Allergy status to sulfonamides status: Secondary | ICD-10-CM | POA: Diagnosis not present

## 2021-01-03 DIAGNOSIS — K219 Gastro-esophageal reflux disease without esophagitis: Secondary | ICD-10-CM | POA: Diagnosis present

## 2021-01-03 DIAGNOSIS — G8929 Other chronic pain: Secondary | ICD-10-CM | POA: Diagnosis present

## 2021-01-03 DIAGNOSIS — F419 Anxiety disorder, unspecified: Secondary | ICD-10-CM | POA: Diagnosis present

## 2021-01-03 DIAGNOSIS — Z931 Gastrostomy status: Secondary | ICD-10-CM | POA: Diagnosis not present

## 2021-01-03 DIAGNOSIS — Z79899 Other long term (current) drug therapy: Secondary | ICD-10-CM | POA: Diagnosis not present

## 2021-01-03 DIAGNOSIS — J849 Interstitial pulmonary disease, unspecified: Secondary | ICD-10-CM | POA: Diagnosis present

## 2021-01-03 DIAGNOSIS — K224 Dyskinesia of esophagus: Secondary | ICD-10-CM | POA: Diagnosis present

## 2021-01-03 DIAGNOSIS — K3184 Gastroparesis: Secondary | ICD-10-CM | POA: Diagnosis present

## 2021-01-03 DIAGNOSIS — Z9049 Acquired absence of other specified parts of digestive tract: Secondary | ICD-10-CM | POA: Diagnosis not present

## 2021-01-03 DIAGNOSIS — Z681 Body mass index (BMI) 19 or less, adult: Secondary | ICD-10-CM | POA: Diagnosis not present

## 2021-01-03 DIAGNOSIS — Z8711 Personal history of peptic ulcer disease: Secondary | ICD-10-CM | POA: Diagnosis not present

## 2021-01-03 DIAGNOSIS — E039 Hypothyroidism, unspecified: Secondary | ICD-10-CM | POA: Diagnosis present

## 2021-01-03 DIAGNOSIS — K5900 Constipation, unspecified: Secondary | ICD-10-CM | POA: Diagnosis present

## 2021-01-03 DIAGNOSIS — Z20822 Contact with and (suspected) exposure to covid-19: Secondary | ICD-10-CM | POA: Diagnosis present

## 2021-01-03 DIAGNOSIS — M549 Dorsalgia, unspecified: Secondary | ICD-10-CM | POA: Diagnosis present

## 2021-01-03 DIAGNOSIS — Z885 Allergy status to narcotic agent status: Secondary | ICD-10-CM | POA: Diagnosis not present

## 2021-01-03 DIAGNOSIS — K2100 Gastro-esophageal reflux disease with esophagitis, without bleeding: Secondary | ICD-10-CM | POA: Diagnosis not present

## 2021-01-03 HISTORY — DX: Acute respiratory failure with hypoxia: J96.01

## 2021-01-03 LAB — RESP PANEL BY RT-PCR (FLU A&B, COVID) ARPGX2
Influenza A by PCR: NEGATIVE
Influenza B by PCR: NEGATIVE
SARS Coronavirus 2 by RT PCR: NEGATIVE

## 2021-01-03 LAB — CBC
HCT: 36.8 % (ref 36.0–46.0)
Hemoglobin: 11.5 g/dL — ABNORMAL LOW (ref 12.0–15.0)
MCH: 26.9 pg (ref 26.0–34.0)
MCHC: 31.3 g/dL (ref 30.0–36.0)
MCV: 86 fL (ref 80.0–100.0)
Platelets: 276 10*3/uL (ref 150–400)
RBC: 4.28 MIL/uL (ref 3.87–5.11)
RDW: 18.1 % — ABNORMAL HIGH (ref 11.5–15.5)
WBC: 11.4 10*3/uL — ABNORMAL HIGH (ref 4.0–10.5)
nRBC: 0 % (ref 0.0–0.2)

## 2021-01-03 LAB — ECHOCARDIOGRAM COMPLETE
AR max vel: 1.85 cm2
AV Area VTI: 1.85 cm2
AV Area mean vel: 1.62 cm2
AV Mean grad: 3 mmHg
AV Peak grad: 6.8 mmHg
Ao pk vel: 1.3 m/s
Area-P 1/2: 5.58 cm2
Calc EF: 60.5 %
Height: 59.5 in
S' Lateral: 2.7 cm
Single Plane A2C EF: 61.9 %
Single Plane A4C EF: 60.4 %
Weight: 1232 oz

## 2021-01-03 LAB — RESPIRATORY PANEL BY PCR

## 2021-01-03 LAB — CBC WITH DIFFERENTIAL/PLATELET
Abs Immature Granulocytes: 0.11 10*3/uL — ABNORMAL HIGH (ref 0.00–0.07)
Basophils Absolute: 0 10*3/uL (ref 0.0–0.1)
Basophils Relative: 0 %
Eosinophils Absolute: 0 10*3/uL (ref 0.0–0.5)
Eosinophils Relative: 0 %
HCT: 36.2 % (ref 36.0–46.0)
Hemoglobin: 11.3 g/dL — ABNORMAL LOW (ref 12.0–15.0)
Immature Granulocytes: 1 %
Lymphocytes Relative: 11 %
Lymphs Abs: 1.3 10*3/uL (ref 0.7–4.0)
MCH: 26.5 pg (ref 26.0–34.0)
MCHC: 31.2 g/dL (ref 30.0–36.0)
MCV: 85 fL (ref 80.0–100.0)
Monocytes Absolute: 0.4 10*3/uL (ref 0.1–1.0)
Monocytes Relative: 3 %
Neutro Abs: 10.1 10*3/uL — ABNORMAL HIGH (ref 1.7–7.7)
Neutrophils Relative %: 85 %
Platelets: 284 10*3/uL (ref 150–400)
RBC: 4.26 MIL/uL (ref 3.87–5.11)
RDW: 18.1 % — ABNORMAL HIGH (ref 11.5–15.5)
WBC: 11.9 10*3/uL — ABNORMAL HIGH (ref 4.0–10.5)
nRBC: 0 % (ref 0.0–0.2)

## 2021-01-03 LAB — COMPREHENSIVE METABOLIC PANEL
ALT: 19 U/L (ref 0–44)
AST: 21 U/L (ref 15–41)
Albumin: 2.7 g/dL — ABNORMAL LOW (ref 3.5–5.0)
Alkaline Phosphatase: 103 U/L (ref 38–126)
Anion gap: 9 (ref 5–15)
BUN: 23 mg/dL — ABNORMAL HIGH (ref 6–20)
CO2: 17 mmol/L — ABNORMAL LOW (ref 22–32)
Calcium: 8.4 mg/dL — ABNORMAL LOW (ref 8.9–10.3)
Chloride: 114 mmol/L — ABNORMAL HIGH (ref 98–111)
Creatinine, Ser: 0.75 mg/dL (ref 0.44–1.00)
GFR, Estimated: >60 mL/min (ref >60)
Glucose, Bld: 130 mg/dL — ABNORMAL HIGH (ref 70–99)
Potassium: 3.7 mmol/L (ref 3.5–5.1)
Sodium: 140 mmol/L (ref 135–145)
Total Bilirubin: 0.7 mg/dL (ref 0.3–1.2)
Total Protein: 5.6 g/dL — ABNORMAL LOW (ref 6.5–8.1)

## 2021-01-03 LAB — TROPONIN I (HIGH SENSITIVITY)
Troponin I (High Sensitivity): 20 ng/L — ABNORMAL HIGH (ref <18)
Troponin I (High Sensitivity): 28 ng/L — ABNORMAL HIGH (ref <18)

## 2021-01-03 LAB — PROCALCITONIN: Procalcitonin: 0.93 ng/mL

## 2021-01-03 LAB — STREP PNEUMONIAE URINARY ANTIGEN: Strep Pneumo Urinary Antigen: NEGATIVE

## 2021-01-03 LAB — CREATININE, SERUM
Creatinine, Ser: 0.86 mg/dL (ref 0.44–1.00)
GFR, Estimated: >60 mL/min (ref >60)

## 2021-01-03 LAB — C-REACTIVE PROTEIN: CRP: 19.4 mg/dL — ABNORMAL HIGH (ref <1.0)

## 2021-01-03 LAB — HIV ANTIBODY (ROUTINE TESTING W REFLEX): HIV Screen 4th Generation wRfx: NONREACTIVE

## 2021-01-03 LAB — PREGNANCY, URINE: Preg Test, Ur: NEGATIVE

## 2021-01-03 LAB — BRAIN NATRIURETIC PEPTIDE: B Natriuretic Peptide: 295 pg/mL — ABNORMAL HIGH (ref 0.0–100.0)

## 2021-01-03 MED ORDER — QUETIAPINE FUMARATE 100 MG PO TABS
200.0000 mg | ORAL_TABLET | Freq: Every day | ORAL | Status: DC
  Administered 2021-01-03 – 2021-01-04 (×2): 200 mg via ORAL
  Filled 2021-01-03 (×2): qty 2
  Filled 2021-01-03: qty 1
  Filled 2021-01-03: qty 2

## 2021-01-03 MED ORDER — SODIUM CHLORIDE 0.9 % IV SOLN
1.0000 g | Freq: Once | INTRAVENOUS | Status: AC
  Administered 2021-01-03: 03:00:00 1 g via INTRAVENOUS
  Filled 2021-01-03: qty 10

## 2021-01-03 MED ORDER — SODIUM CHLORIDE 0.9 % IV SOLN
3.0000 g | Freq: Four times a day (QID) | INTRAVENOUS | Status: DC
  Administered 2021-01-03 – 2021-01-05 (×9): 3 g via INTRAVENOUS
  Filled 2021-01-03 (×12): qty 8

## 2021-01-03 MED ORDER — HYDROXYZINE HCL 25 MG PO TABS
50.0000 mg | ORAL_TABLET | Freq: Every day | ORAL | Status: DC
  Administered 2021-01-03 – 2021-01-04 (×2): 50 mg via ORAL
  Filled 2021-01-03 (×2): qty 2

## 2021-01-03 MED ORDER — PANTOPRAZOLE SODIUM 40 MG IV SOLR
40.0000 mg | Freq: Two times a day (BID) | INTRAVENOUS | Status: DC
  Administered 2021-01-03 – 2021-01-05 (×5): 40 mg via INTRAVENOUS
  Filled 2021-01-03 (×5): qty 40

## 2021-01-03 MED ORDER — DRONABINOL 2.5 MG PO CAPS
2.5000 mg | ORAL_CAPSULE | Freq: Every day | ORAL | Status: DC
  Administered 2021-01-03 – 2021-01-05 (×3): 2.5 mg via ORAL
  Filled 2021-01-03 (×3): qty 1

## 2021-01-03 MED ORDER — ACETAMINOPHEN 650 MG RE SUPP: 650.0000 mg | Freq: Four times a day (QID) | RECTAL | Status: DC | PRN

## 2021-01-03 MED ORDER — IOHEXOL 350 MG/ML SOLN
100.0000 mL | Freq: Once | INTRAVENOUS | Status: AC | PRN
  Administered 2021-01-03: 02:00:00 70 mL via INTRAVENOUS

## 2021-01-03 MED ORDER — SODIUM CHLORIDE 0.9 % IV SOLN
2.0000 g | INTRAVENOUS | Status: DC
  Administered 2021-01-03: 08:00:00 2 g via INTRAVENOUS
  Filled 2021-01-03: qty 20

## 2021-01-03 MED ORDER — ONDANSETRON HCL 4 MG/2ML IJ SOLN
4.0000 mg | Freq: Once | INTRAMUSCULAR | Status: AC
  Administered 2021-01-03: 02:00:00 4 mg via INTRAVENOUS
  Filled 2021-01-03: qty 2

## 2021-01-03 MED ORDER — ACETAMINOPHEN 325 MG PO TABS
650.0000 mg | ORAL_TABLET | Freq: Four times a day (QID) | ORAL | Status: DC | PRN
  Administered 2021-01-04: 12:00:00 650 mg via ORAL
  Filled 2021-01-03: qty 2

## 2021-01-03 MED ORDER — TRAZODONE HCL 50 MG PO TABS
100.0000 mg | ORAL_TABLET | Freq: Every day | ORAL | Status: DC
  Administered 2021-01-03 – 2021-01-04 (×2): 100 mg via ORAL
  Filled 2021-01-03 (×2): qty 2

## 2021-01-03 MED ORDER — ENOXAPARIN SODIUM 40 MG/0.4ML IJ SOSY
40.0000 mg | PREFILLED_SYRINGE | INTRAMUSCULAR | Status: DC
  Administered 2021-01-03 – 2021-01-04 (×2): 40 mg via SUBCUTANEOUS
  Filled 2021-01-03 (×2): qty 0.4

## 2021-01-03 MED ORDER — OXYCODONE-ACETAMINOPHEN 5-325 MG PO TABS
1.0000 | ORAL_TABLET | Freq: Four times a day (QID) | ORAL | Status: DC
  Administered 2021-01-03 – 2021-01-05 (×10): 1 via ORAL
  Filled 2021-01-03 (×10): qty 1

## 2021-01-03 MED ORDER — OXYCODONE HCL 5 MG PO TABS
5.0000 mg | ORAL_TABLET | Freq: Four times a day (QID) | ORAL | Status: DC
  Administered 2021-01-03 – 2021-01-05 (×10): 5 mg via ORAL
  Filled 2021-01-03 (×10): qty 1

## 2021-01-03 MED ORDER — ONDANSETRON 4 MG PO TBDP: 4.0000 mg | ORAL_TABLET | Freq: Four times a day (QID) | ORAL | Status: DC | PRN

## 2021-01-03 MED ORDER — ALBUTEROL SULFATE (2.5 MG/3ML) 0.083% IN NEBU: 2.5000 mg | INHALATION_SOLUTION | Freq: Four times a day (QID) | RESPIRATORY_TRACT | Status: DC | PRN

## 2021-01-03 MED ORDER — GABAPENTIN 400 MG PO CAPS
800.0000 mg | ORAL_CAPSULE | Freq: Three times a day (TID) | ORAL | Status: DC
  Administered 2021-01-03 – 2021-01-05 (×7): 800 mg via ORAL
  Filled 2021-01-03 (×9): qty 2

## 2021-01-03 MED ORDER — SODIUM CHLORIDE 0.9 % IV SOLN
500.0000 mg | Freq: Once | INTRAVENOUS | Status: AC
  Administered 2021-01-03: 04:00:00 500 mg via INTRAVENOUS
  Filled 2021-01-03: qty 5

## 2021-01-03 MED ORDER — BUSPIRONE HCL 5 MG PO TABS
15.0000 mg | ORAL_TABLET | Freq: Two times a day (BID) | ORAL | Status: DC
  Administered 2021-01-03 – 2021-01-05 (×5): 15 mg via ORAL
  Filled 2021-01-03: qty 1
  Filled 2021-01-03: qty 2
  Filled 2021-01-03 (×3): qty 1

## 2021-01-03 MED ORDER — OXYCODONE-ACETAMINOPHEN 7.5-325 MG PO TABS: 1.0000 | ORAL_TABLET | Freq: Four times a day (QID) | ORAL | Status: DC

## 2021-01-03 MED ORDER — CYCLOBENZAPRINE HCL 5 MG PO TABS
5.0000 mg | ORAL_TABLET | Freq: Three times a day (TID) | ORAL | Status: DC | PRN
  Administered 2021-01-04 – 2021-01-05 (×2): 5 mg via ORAL
  Filled 2021-01-03 (×2): qty 1

## 2021-01-03 MED ORDER — FUROSEMIDE 10 MG/ML IJ SOLN
20.0000 mg | Freq: Once | INTRAMUSCULAR | Status: AC
  Administered 2021-01-03: 04:00:00 20 mg via INTRAVENOUS
  Filled 2021-01-03: qty 2

## 2021-01-03 MED ORDER — SODIUM CHLORIDE 0.9 % IV SOLN
500.0000 mg | INTRAVENOUS | Status: DC
  Administered 2021-01-03 – 2021-01-05 (×3): 500 mg via INTRAVENOUS
  Filled 2021-01-03 (×3): qty 5

## 2021-01-03 MED ORDER — SUCRALFATE 1 GM/10ML PO SUSP
1.0000 g | Freq: Three times a day (TID) | ORAL | Status: DC
  Administered 2021-01-03 – 2021-01-05 (×4): 1 g via ORAL
  Filled 2021-01-03 (×6): qty 10

## 2021-01-03 MED ORDER — SODIUM CHLORIDE 0.9 % IV SOLN
12.5000 mg | Freq: Four times a day (QID) | INTRAVENOUS | Status: DC | PRN
  Administered 2021-01-03 – 2021-01-05 (×4): 12.5 mg via INTRAVENOUS
  Filled 2021-01-03 (×2): qty 12.5
  Filled 2021-01-03 (×2): qty 0.5

## 2021-01-03 MED ORDER — METHYLPREDNISOLONE SODIUM SUCC 40 MG IJ SOLR
40.0000 mg | Freq: Two times a day (BID) | INTRAMUSCULAR | Status: DC
  Administered 2021-01-03 – 2021-01-05 (×4): 40 mg via INTRAVENOUS
  Filled 2021-01-03 (×4): qty 1

## 2021-01-03 MED ORDER — OSMOLITE 1.2 CAL PO LIQD
600.0000 mL | ORAL | Status: DC
  Administered 2021-01-03: 22:00:00 600 mL
  Filled 2021-01-03: qty 1000

## 2021-01-03 NOTE — ED Notes (Signed)
Pt requesting mountain dew, however pt is inducing vomiting. Pt also informed that we do not have mountain dew.

## 2021-01-03 NOTE — Progress Notes (Signed)
Pharmacy Antibiotic Note  [name] is a 50 y.o. female admitted on 01/02/2021 presenting with SOB with cough, vomiting and concern for aspiration.  Pharmacy has been consulted for Unasyn dosing.  Plan: Unasyn 3g IV every 6 hours Monitor renal function, clinical progression and LOT     Temp (24hrs), Avg:98 F (36.7 C), Min:98 F (36.7 C), Max:98 F (36.7 C)  Recent Labs  Lab 01/03/21 0004 01/03/21 0755  WBC 11.9* 11.4*  CREATININE 0.75  --     CrCl cannot be calculated (Unknown ideal weight.).    Allergies  Allergen Reactions   Morphine And Related    Sulfa Antibiotics     Bertis Ruddy, PharmD Clinical Pharmacist ED Pharmacist Phone # [phone] 01/03/2021 8:36 AM

## 2021-01-03 NOTE — Progress Notes (Signed)
PROGRESS NOTE        PATIENT DETAILS Name: [name] Age: 50 y.o. Sex: female Date of Birth: May 05, 1970 Admit Date: 01/02/2021 Admitting Physician Rise Patience, MD [id number], Ovid Curd, [place]  Brief Narrative: Patient is a 50 y.o. female with history of peptic ulcer disease causing gastric outlet obstruction-s/p partial gastrectomy/bilateral vagotomy in 2018, severe postsurgical gastroparesis, chronic constipation, hypothyroidism, depression, malnutrition with G-tube in place-presented with shortness of breath-found to have acute hypoxic respiratory failure secondary to extensive bilateral pulmonary infiltrates  Subjective: Feels nauseous-was trying to induce vomiting while I was in the room.  Per patient-not unusual for her to vomit several times a day.  Thinks her breathing is somewhat better-inquiring about when she can go home.  She appears anxious  Objective: Vitals: Blood pressure 128/80, pulse 83, temperature 98 F (36.7 C), temperature source Oral, resp. rate (!) 36, SpO2 96 %.   Exam: Gen Exam:Alert awake-not in any distress.  Appears cachectic. HEENT:atraumatic, normocephalic Chest: Bibasilar rales. CVS:S1S2 regular Abdomen:soft non tender, non distended Extremities:no edema Neurology: Non focal Skin: no rash  Pertinent Labs/Radiology: Recent Labs  Lab 01/03/21 0004 01/03/21 0755  WBC 11.9* 11.4*  HGB 11.3* 11.5*  PLT 284 276  NA 140  --   K 3.7  --   CREATININE 0.75  --   AST 21  --   ALT 19  --   ALKPHOS 103  --   BILITOT 0.7  --     Assessment/Plan: Acute hypoxic respiratory failure due to extensive bilateral infiltrates-likely? due to aspiration in the setting of esophageal dysmotility/gastroparesis: Appears comfortable at rest-was on 6 L of oxygen initially-has been titrated down to around 3 L.  Influenza/COVID PCR negative.  Reviewed prior notes from PCCM as an outpatient-has had prior groundglass opacities on imaging  studies-thought to be from aspiration-suspect worsening lung infiltrates and resultant hypoxemia is probably from chronic aspiration issues.   She has no signs of volume overload-even though BNP is minimally elevated.  We will switch to Unasyn-check procalcitonin/CRP/echo/respiratory virus panel-get PCCM input as well-to see to see if further work-up/alternative diagnosis need to be considered.  History of peptic ulcer disease causing gastric outlet obstruction-s/p partial gastrectomy/vagotomy with resultant postoperative gastroparesis/esophageal dysmotility syndrome: Follows with Dr. Hilarie Fredrickson in the outpatient setting-continue PPI-esophageal thickening seen on CTA chest-we will obtain SLP evaluation.  Per last GI note-outpatient endoscopy was being contemplated.  Since hypoxia seems to be somewhat improved-we will get GI opinion to see if patient needs endoscopic evaluation while inpatient.  Encouraged small portion meals-use antiemetics as needed.  Severe malnutrition: On J-tube feedings at home-we will obtain nutrition evaluation.  On Marinol chronically to stimulate appetite.  History of mood disorder: Continue BuSpar/Seroquel/trazodone  Body mass index is 15.29 kg/m.   Procedures: None Consults: PCCM,GI DVT Prophylaxis: Lovenox Code Status:Full code  Family Communication: None at bedside  Time spent: 35 minutes-Greater than 50% of this time was spent in counseling, explanation of diagnosis, planning of further management, and coordination of care.  Disposition Plan: Status is: Inpatient  Remains inpatient appropriate because: Hypoxia due to significant amount of pulmonary infiltrates-suspicion for aspiration issues-see above documentation-and not yet stable for discharge.   Diet: Diet Order     None         Antimicrobial agents: Anti-infectives (From admission, onward)    Start     Dose/Rate Route  Frequency Ordered Stop   01/03/21 0600  cefTRIAXone (ROCEPHIN) 2 g in sodium  chloride 0.9 % 100 mL IVPB        2 g 200 mL/hr over 30 Minutes Intravenous Every 24 hours 01/03/21 0518 01/08/21 0559   01/03/21 0600  azithromycin (ZITHROMAX) 500 mg in sodium chloride 0.9 % 250 mL IVPB        500 mg 250 mL/hr over 60 Minutes Intravenous Every 24 hours 01/03/21 0518 01/08/21 0559   01/03/21 0300  cefTRIAXone (ROCEPHIN) 1 g in sodium chloride 0.9 % 100 mL IVPB        1 g 200 mL/hr over 30 Minutes Intravenous  Once 01/03/21 0251 01/03/21 0358   01/03/21 0300  azithromycin (ZITHROMAX) 500 mg in sodium chloride 0.9 % 250 mL IVPB        500 mg 250 mL/hr over 60 Minutes Intravenous  Once 01/03/21 0251 01/03/21 0514        MEDICATIONS: Scheduled Meds:  busPIRone  15 mg Oral BID   dronabinol  2.5 mg Oral Daily   enoxaparin (LOVENOX) injection  40 mg Subcutaneous Q24H   feeding supplement (OSMOLITE 1.2 CAL)  600 mL Per Tube Q24H   gabapentin  800 mg Oral TID   hydrOXYzine  50 mg Oral QHS   oxyCODONE-acetaminophen  1 tablet Oral QID   And   oxyCODONE  5 mg Oral QID   pantoprazole (PROTONIX) IV  40 mg Intravenous Q12H   QUEtiapine  200 mg Oral QHS   traZODone  100 mg Oral QHS   Continuous Infusions:  azithromycin     cefTRIAXone (ROCEPHIN)  IV 2 g (01/03/21 0758)   promethazine (PHENERGAN) injection (IM or IVPB)     PRN Meds:.acetaminophen **OR** acetaminophen, albuterol, cyclobenzaprine, promethazine (PHENERGAN) injection (IM or IVPB)   I have personally reviewed following labs and imaging studies  LABORATORY DATA: CBC: Recent Labs  Lab 01/03/21 0004 01/03/21 0755  WBC 11.9* 11.4*  NEUTROABS 10.1*  --   HGB 11.3* 11.5*  HCT 36.2 36.8  MCV 85.0 86.0  PLT 284 630    Basic Metabolic Panel: Recent Labs  Lab 01/03/21 0004  NA 140  K 3.7  CL 114*  CO2 17*  GLUCOSE 130*  BUN 23*  CREATININE 0.75  CALCIUM 8.4*    GFR: CrCl cannot be calculated (Unknown ideal weight.).  Liver Function Tests: Recent Labs  Lab 01/03/21 0004  AST 21  ALT 19   ALKPHOS 103  BILITOT 0.7  PROT 5.6*  ALBUMIN 2.7*   No results for input(s): LIPASE, AMYLASE in the last 168 hours. No results for input(s): AMMONIA in the last 168 hours.  Coagulation Profile: No results for input(s): INR, PROTIME in the last 168 hours.  Cardiac Enzymes: No results for input(s): CKTOTAL, CKMB, CKMBINDEX, TROPONINI in the last 168 hours.  BNP (last 3 results) No results for input(s): PROBNP in the last 8760 hours.  Lipid Profile: No results for input(s): CHOL, HDL, LDLCALC, TRIG, CHOLHDL, LDLDIRECT in the last 72 hours.  Thyroid Function Tests: No results for input(s): TSH, T4TOTAL, FREET4, T3FREE, THYROIDAB in the last 72 hours.  Anemia Panel: No results for input(s): VITAMINB12, FOLATE, FERRITIN, TIBC, IRON, RETICCTPCT in the last 72 hours.  Urine analysis: No results found for: COLORURINE, APPEARANCEUR, LABSPEC, PHURINE, GLUCOSEU, HGBUR, BILIRUBINUR, KETONESUR, PROTEINUR, UROBILINOGEN, NITRITE, LEUKOCYTESUR  Sepsis Labs: Lactic Acid, Venous No results found for: LATICACIDVEN  MICROBIOLOGY: Recent Results (from the past 240 hour(s))  Resp Panel by RT-PCR (  Flu A&B, Covid) Nasopharyngeal Swab     Status: None   Collection Time: 01/03/21 12:06 AM   Specimen: Nasopharyngeal Swab; Nasopharyngeal(NP) swabs in vial transport medium  Result Value Ref Range Status   SARS Coronavirus 2 by RT PCR NEGATIVE NEGATIVE Final    Comment: (NOTE) SARS-CoV-2 target nucleic acids are NOT DETECTED.  The SARS-CoV-2 RNA is generally detectable in upper respiratory specimens during the acute phase of infection. The lowest concentration of SARS-CoV-2 viral copies this assay can detect is 138 copies/mL. A negative result does not preclude SARS-Cov-2 infection and should not be used as the sole basis for treatment or other patient management decisions. A negative result may occur with  improper specimen collection/handling, submission of specimen other than nasopharyngeal  swab, presence of viral mutation(s) within the areas targeted by this assay, and inadequate number of viral copies(<138 copies/mL). A negative result must be combined with clinical observations, patient history, and epidemiological information. The expected result is Negative.  Fact Sheet for Patients:  EntrepreneurPulse.com.au  Fact Sheet for Healthcare Providers:  IncredibleEmployment.be  This test is no t yet approved or cleared by the [place] FDA and  has been authorized for detection and/or diagnosis of SARS-CoV-2 by FDA under an Emergency Use Authorization (EUA). This EUA will remain  in effect (meaning this test can be used) for the duration of the COVID-19 declaration under Section 564(b)(1) of the Act, 21 U.S.C.section 360bbb-3(b)(1), unless the authorization is terminated  or revoked sooner.       Influenza A by PCR NEGATIVE NEGATIVE Final   Influenza B by PCR NEGATIVE NEGATIVE Final    Comment: (NOTE) The Xpert Xpress SARS-CoV-2/FLU/RSV plus assay is intended as an aid in the diagnosis of influenza from Nasopharyngeal swab specimens and should not be used as a sole basis for treatment. Nasal washings and aspirates are unacceptable for Xpert Xpress SARS-CoV-2/FLU/RSV testing.  Fact Sheet for Patients: EntrepreneurPulse.com.au  Fact Sheet for Healthcare Providers: IncredibleEmployment.be  This test is not yet approved or cleared by the [place] FDA and has been authorized for detection and/or diagnosis of SARS-CoV-2 by FDA under an Emergency Use Authorization (EUA). This EUA will remain in effect (meaning this test can be used) for the duration of the COVID-19 declaration under Section 564(b)(1) of the Act, 21 U.S.C. section 360bbb-3(b)(1), unless the authorization is terminated or revoked.  Performed at [facility], [place] [address], [place],  [address]      RADIOLOGY STUDIES/RESULTS: DG Abd 1 View  Result Date: 01/03/2021 CLINICAL DATA:  Abdominal pain. EXAM: ABDOMEN - 1 VIEW COMPARISON:  None. FINDINGS: No gaseous bowel dilatation to suggest obstruction. Apparent left-sided surgical feeding catheter. Sacral stimulator device noted. Contrast material in the bladder compatible with CT chest earlier today. IMPRESSION: No evidence of bowel obstruction. Electronically Signed   By: Misty Stanley M.D.   On: 01/03/2021 08:05   CT Angio Chest PE W and/or Wo Contrast  Result Date: 01/03/2021 CLINICAL DATA:  Pulmonary embolism suspected, high probability. Cough, dyspnea, shortness of breath. EXAM: CT ANGIOGRAPHY CHEST WITH CONTRAST TECHNIQUE: Multidetector CT imaging of the chest was performed using the standard protocol during bolus administration of intravenous contrast. Multiplanar CT image reconstructions and MIPs were obtained to evaluate the vascular anatomy. CONTRAST:  61mL OMNIPAQUE IOHEXOL 350 MG/ML SOLN COMPARISON:  07/07/2019. FINDINGS: Cardiovascular: The heart is normal in size and there is no pericardial effusion. The aorta and pulmonary trunk are normal in caliber. No pulmonary artery filling defect is  identified. Mediastinum/Nodes: Prominent lymph nodes are present in the subcarinal space and right hilum measuring up to 1 cm in short axis diameter. No left hilar adenopathy. No axillary lymphadenopathy bilaterally. Thyroid gland and trachea are within normal limits. There is thickening of the walls of the mid to distal esophagus. Lungs/Pleura: Fibrotic changes are noted in the lungs bilaterally. There are hazy ground-glass airspace opacities bilaterally, greater on the right than on the left. No effusion or pneumothorax. Upper Abdomen: Surgical changes are present in the epigastric region. There is a small amount of perihepatic free fluid. Musculoskeletal: Old healed rib fractures are noted on the right. Compression fractures are noted at T7, T8,  T11, and T12. The fracture at T7 is unchanged from the previous exam. Review of the MIP images confirms the above findings. IMPRESSION: 1. No evidence of pulmonary embolism. 2. Extensive ground-glass opacities in the lungs bilaterally, possible edema or infiltrate. 3. Marked thickening of the walls of the mid to distal esophagus. Endoscopy is recommended for further evaluation. 4. Stable compression deformity at T7. There are compression deformities in the thoracic spine at T8, T11, and T12, indeterminate in age. 5. Small amount of free fluid in the left upper quadrant. Electronically Signed   By: Brett Fairy M.D.   On: 01/03/2021 02:41   DG Chest Port 1 View  Result Date: 01/02/2021 CLINICAL DATA:  Cough, dyspnea EXAM: PORTABLE CHEST 1 VIEW COMPARISON:  09/11/2019 FINDINGS: Lung volumes are small, however, pulmonary insufflation is symmetric and stable since prior examination. Superimposed predominantly mid and lower lung zone interstitial and airspace infiltrate has significantly progressed in the interval since prior examination, likely inflammatory in nature. Noncardiogenic pulmonary edema is considered less likely. No pneumothorax or pleural effusion. Cardiac size within normal limits. Pulmonary vascularity is normal. IMPRESSION: Marked interval progression of mid and lower lung zone predominant pulmonary infiltrate, favored to be inflammatory. This would be better assessed with nonemergent high-resolution CT examination of the chest, if clinically indicated. Electronically Signed   By: Fidela Salisbury M.D.   On: 01/02/2021 23:48     LOS: 0 days   Oren Binet, MD  [facility]    To contact the attending provider between 7A-7P or the covering provider during after hours 7P-7A, please log into the web site [email or web address] and access using universal [place] password for that web site. If you do not have the password, please call the hospital operator.  01/03/2021, 8:29 AM

## 2021-01-03 NOTE — Plan of Care (Signed)

## 2021-01-03 NOTE — Consult Note (Signed)
° °  NAME:  [name], MRN:  [id number], DOB:  [birth year], LOS: 0 ADMISSION DATE:  01/02/2021, CONSULTATION DATE:  01/03/2021 REFERRING MD:  dr ghimire, CHIEF COMPLAINT: Shortness of breath  History of Present Illness:  Patient was admitted with shortness of breath No fever, no contact with anybody with a febrile illness History of recurrent aspirations History of gastric outlet obstruction-partial gastrectomy/bilateral vagotomy, severe gastroparesis Known history of aspiration events Pertinent  Medical History   Past Medical History:  Diagnosis Date   Anxiety    GERD (gastroesophageal reflux disease)     Significant Hospital Events: Including procedures, antibiotic start and stop dates in addition to other pertinent events   CT scan of the chest with bilateral groundglass changes  Interim History / Subjective:  Shortness of breath is better  Objective   Blood pressure 112/72, pulse 94, temperature 98.8 F (37.1 C), temperature source Oral, resp. rate 20, height 4' 11.5" (1.511 m), weight 34.9 kg, SpO2 100 %.        Intake/Output Summary (Last 24 hours) at 01/03/2021 1201 Last data filed at 01/03/2021 1037 Gross per 24 hour  Intake 847.93 ml  Output 700 ml  Net 147.93 ml   Filed Weights   01/03/21 0911  Weight: 34.9 kg    Examination: General: Middle-aged, chronically ill-appearing HENT: Dry oral mucosa Lungs: Decreased air movement bilaterally Cardiovascular: S1-S2 appreciated Abdomen: Bowel sounds appreciated Extremities: No clubbing, no edema Neuro: Alert and oriented x3 GU: [facility] Problem list     Assessment & Plan:  Bilateral infiltrative process likely related to chronic aspiration and possibly an acute aspiration event Hypoxemia related to aspiration  No symptoms or signs of an acute infectious process However with aspiration, may benefit from short course of antibiotics  Severe protein calorie malnutrition -Continue  support  Short course of steroids -Solu-Medrol, 40 twice daily  Unfortunately this is a chronic process and pulmonary status would likely worsen if she continues to aspirate  Patient is an active smoker -Counseled about the need to quit smoking  Sherrilyn Rist, MD [place] [facility] Pager: See Shea Evans

## 2021-01-03 NOTE — Consult Note (Signed)
Referring Provider: Dr. Oren [name] Primary Care Physician:  [name] Bender, PA-C Primary Gastroenterologist:  Dr. Hilarie [name]  Reason for Consultation: Gastroparesis, dysmotility  HPI: [name] [name] is a 50 y.o. female with a past medical history of [name], [name] [name], [name] [name], [name] [name].   She presented to [facility] ED today with shortness of breath.  She was seen by her PCP this morning who suspected she had influenza  and she was stable and sent home. She returned home and developed worsening shortness of breath.  She presented to the ED by EMS.  Her oxygen saturations were in the 60s and she was placed on oxygen nonrebreather then transition to 6 L nasal cannula her oxygenation improved.  A chest CTA was negative for PE but showed groundglass appearance in both lungs concerning for CHF versus infectious process, prominent lymph nodes present in the subcarinal space and right hilum measuring up to 1 cm and thickening of the walls of the mid to distal esophagus. She received Rocephin, Zithromax and Lasix.  There is coronavirus 2 negative.  Influenza a and B negative. A GI consult was requested due to esophageal thickening seen on CTA.  SLP evaluation ordered but not yet completed.  Labs 01/03/2021: Sodium 140.  Potassium 3.7.  Glucose 130.  BUN 23.  Creatinine 0.75.  Albumin 2.7.  Alk phos 103.  Total bili 0.7.  AST 21.  ALT 19.  BNP 295.  WBC 11.9.  Hemoglobin 11.3.  Hematocrit 36.2.  MCV 85.0.  Platelet 284.  She complains of having dysphagia which occurs once or twice weekly.  She describes having food which gets briefly hung up in her throat, she coughs and the stuck food passes. She does not cough out the stuck food.  No  heartburn.  She is on Omeprazole 4m twice daily.  No upper or lower abdominal pain.  He states she is no longer utilizing any tube feedings for the past few weeks, further details are unclear.  (She previously was on nocturnal tube feeds usually 2-1/2 cans of Osmolite 1.5 overnight and 0.5 cans as a bolus feeding during the day and boluses 2 to 3 days/week when at work.). She typically eats one in the late evening. Nausea is typically well controlled on Marinol tid, Ondansetron and Promethazine. She is on Oxycodone for [name] back pain.  She has [name], passes small bits of stools once weekly.  She takes Dulcolax as needed.  No rectal bleeding or black stools.  She reported undergoing a colonoscopy 5 years ago which was normal.  I am unable to locate a colonoscopy procedure report in epic.  She does not recall where she had the colonoscopy completed.  She was last seen by Dr. PHilarie Fredricksonin our outpatient GI clinic 12/10/2020.  At that time, she continued to struggle with oral nutrition as well as keeping up with her tube feeds.  A diverting proximal gastrojejunostomy was discussed which would hopefully help her severe gastroparesis.  She was referred to surgeon Dr. EGreer Pickerelbut has not been seen yet. An EGD was recommended prior to pursuing any surgical intervention, however, she declined due to the cost of this procedure.  Her most recent EGD was 09/26/2018 which identified acute and erosive esophagitis, evidence of a past antrectomy without stomal ulceration noted  erythematous mucosa in the gastric body.   Chest CTA 01/03/2021: 1. No evidence of pulmonary embolism. 2. Extensive ground-glass opacities in the lungs bilaterally, possible edema or infiltrate. 3. Marked thickening of the walls of the mid to distal esophagus. Endoscopy is recommended for further evaluation. 4. Stable compression deformity at T7. There are compression deformities in the thoracic spine at T8, T11, and T12,  indeterminate in age. 5. Small amount of free fluid in the left upper quadrant.  ECHO 01/03/2021: Left ventricular ejection fraction, by estimation, is 60 to 65%. The left ventricle has normal function. The left ventricle has no regional wall motion abnormalities. Left ventricular diastolic parameters were normal. 1. 2. Right ventricular systolic function is normal. The right ventricular size is normal. The mitral valve is myxomatous. Mild to moderate mitral valve regurgitation. No evidence of mitral stenosis. There is mild late systolic prolapse of both leaflets of the mitral valve. 3. 4. The tricuspid valve is myxomatous. Tricuspid valve regurgitation is mild to moderate. The aortic valve is normal in structure. Aortic valve regurgitation is not visualized. No aortic stenosis is present. 5. The inferior vena cava is normal in size with greater than 50% respiratory variability, suggesting right atrial pressure of 3 mmHg.  EGD 04/2018: - LA Grade D acute and erosive esophagitis. Biopsied. - An antrectomy was found, characterized by visible sutures and no stomal ulceration. Suture material removed. - Scar in the gastric body. - Erythematous mucosa in the gastric body. Biopsied. - Normal examined duodenum.  Esophageal manometry the 03/15/2019: Normal relaxation of the EG junction Absent peristalsis with poor Allen's clearance Will need to exclude scleroderma esophagus or mixed connective tissue disorder She was referred to rheumatologist Dr. Amil [name] to rule out scleroderma which could contribute to her esophageal symptoms.  She stated seeing Dr. Amil [name] on 05/26/2019 and laboratory studies were negative for scleroderma and no further follow-up was recommended.    pH impedance study 03/15/2019: Elevated esophageal acid exposure in upright position Esophageal acid exposure is abnormal and upright position Overall reflux events slightly elevated, predominantly weak acid reflux No  symptom correlates with reflux events based on symptom association probability  Past Medical History:  Diagnosis Date   [name]    [name] (gastroesophageal reflux disease)     Past Surgical History:  Procedure Laterality Date   PARTIAL GASTRECTOMY      Prior to Admission medications   Medication Sig Start Date End Date Taking? Authorizing Provider  albuterol (VENTOLIN HFA) 108 (90 Base) MCG/ACT inhaler Inhale 1-2 puffs into the lungs 4 (four) times daily as needed for shortness of breath or wheezing. 12/26/20  Yes [provider]  busPIRone (BUSPAR) 15 MG tablet Take 15 mg by mouth 2 (two) times daily. 12/28/20  Yes [provider]  cyclobenzaprine (FLEXERIL) 5 MG tablet Take 5 mg by mouth 3 (three) times daily as needed for muscle spasms. 12/19/20  Yes [provider]  dronabinol (MARINOL) 2.5 MG capsule Take 2.5 mg by mouth daily. 12/30/20  Yes [provider]  gabapentin (NEURONTIN) 800 MG tablet Take 800 mg by mouth 3 (three) times daily. 12/16/20  Yes [provider]  hydrOXYzine (ATARAX) 25 MG tablet Take 50 mg by mouth at bedtime. 12/14/20  Yes [provider]  naloxone (NARCAN) nasal spray 4 mg/0.1 mL Place 1 spray into the nose once as needed. 10/08/20  Yes [provider]  Nutritional Supplements (OSMOLITE 1.5 CAL PO) Give 2.5 Cans by tube at bedtime. Pt states that she infuses  this via an infusion pump over 8-10 hours   Yes [provider]  ondansetron (ZOFRAN-ODT) 4 MG disintegrating tablet Take 4 mg by mouth every 6 (six) hours as needed for nausea or vomiting. 10/01/20  Yes [provider]  oxyCODONE-acetaminophen (PERCOCET) 7.5-325 MG tablet Take 1 tablet by mouth 4 (four) times daily. 12/18/20  Yes [provider]  QUEtiapine (SEROQUEL) 200 MG tablet Take 200 mg by mouth at bedtime. 12/14/20  Yes [provider]  traZODone (DESYREL) 50 MG tablet Take 100 mg by mouth at bedtime. 10/11/20   Yes [provider]    Current Facility-Administered Medications  Medication Dose Route Frequency Provider Last Rate Last Admin   acetaminophen (TYLENOL) tablet 650 mg  650 mg Oral Q6H PRN Rise Patience, MD       Or   acetaminophen (TYLENOL) suppository 650 mg  650 mg Rectal Q6H PRN Rise Patience, MD       albuterol (PROVENTIL) (2.5 MG/3ML) 0.083% nebulizer solution 2.5 mg  2.5 mg Inhalation QID PRN Rise Patience, MD       Ampicillin-Sulbactam (UNASYN) 3 g in sodium chloride 0.9 % 100 mL IVPB  3 g Intravenous Q6H Bertis Ruddy, RPH   Stopped at 01/03/21 1028   azithromycin (ZITHROMAX) 500 mg in sodium chloride 0.9 % 250 mL IVPB  500 mg Intravenous Q24H Rise Patience, MD   Stopped at 01/03/21 0946   busPIRone (BUSPAR) tablet 15 mg  15 mg Oral BID Rise Patience, MD   15 mg at 01/03/21 0914   cyclobenzaprine (FLEXERIL) tablet 5 mg  5 mg Oral TID PRN Rise Patience, MD       dronabinol (MARINOL) capsule 2.5 mg  2.5 mg Oral Daily Rise Patience, MD   2.5 mg at 01/03/21 0915   enoxaparin (LOVENOX) injection 40 mg  40 mg Subcutaneous Q24H Rise Patience, MD   40 mg at 01/03/21 0916   feeding supplement (OSMOLITE 1.2 CAL) liquid 600 mL  600 mL Per Tube Q24H Rise Patience, MD       gabapentin (NEURONTIN) capsule 800 mg  800 mg Oral TID Rise Patience, MD   800 mg at 01/03/21 0913   hydrOXYzine (ATARAX) tablet 50 mg  50 mg Oral QHS Rise Patience, MD       oxyCODONE-acetaminophen (PERCOCET/ROXICET) 5-325 MG per tablet 1 tablet  1 tablet Oral QID Rise Patience, MD   1 tablet at 01/03/21 8938   And   oxyCODONE (Oxy [name]/ROXICODONE) immediate release tablet 5 mg  5 mg Oral QID Rise Patience, MD   5 mg at 01/03/21 0915   pantoprazole (PROTONIX) injection 40 mg  40 mg Intravenous Q12H Jonetta Osgood, MD   40 mg at 01/03/21 0916   promethazine (PHENERGAN) 12.5 mg in sodium chloride 0.9 % 50 mL IVPB  12.5 mg  Intravenous Q6H PRN Jonetta Osgood, MD   Stopped at 01/03/21 0928   QUEtiapine (SEROQUEL) tablet 200 mg  200 mg Oral QHS Rise Patience, MD       traZODone (DESYREL) tablet 100 mg  100 mg Oral QHS Rise Patience, MD       Current Outpatient Medications  Medication Sig Dispense Refill   albuterol (VENTOLIN HFA) 108 (90 Base) MCG/ACT inhaler Inhale 1-2 puffs into the lungs 4 (four) times daily as needed for shortness of breath or wheezing.     busPIRone (BUSPAR) 15 MG tablet Take 15  mg by mouth 2 (two) times daily.     cyclobenzaprine (FLEXERIL) 5 MG tablet Take 5 mg by mouth 3 (three) times daily as needed for muscle spasms.     dronabinol (MARINOL) 2.5 MG capsule Take 2.5 mg by mouth daily.     gabapentin (NEURONTIN) 800 MG tablet Take 800 mg by mouth 3 (three) times daily.     hydrOXYzine (ATARAX) 25 MG tablet Take 50 mg by mouth at bedtime.     naloxone (NARCAN) nasal spray 4 mg/0.1 mL Place 1 spray into the nose once as needed.     Nutritional Supplements (OSMOLITE 1.5 CAL PO) Give 2.5 Cans by tube at bedtime. Pt states that she infuses this via an infusion pump over 8-10 hours     ondansetron (ZOFRAN-ODT) 4 MG disintegrating tablet Take 4 mg by mouth every 6 (six) hours as needed for nausea or vomiting.     oxyCODONE-acetaminophen (PERCOCET) 7.5-325 MG tablet Take 1 tablet by mouth 4 (four) times daily.     QUEtiapine (SEROQUEL) 200 MG tablet Take 200 mg by mouth at bedtime.     traZODone (DESYREL) 50 MG tablet Take 100 mg by mouth at bedtime.      Allergies as of 01/02/2021   (Not on File)    Family History  Family history unknown: Yes    Social History   Socioeconomic History   Marital status: Divorced    Spouse name: Not on file   Number of children: Not on file   Years of education: Not on file   Highest education level: Not on file  Occupational History   Not on file  Tobacco Use   Smoking status: Every Day    Types: Cigarettes   Smokeless tobacco:  Never  Substance and Sexual Activity   Alcohol use: Not Currently   Drug use: Never   Sexual activity: Not on file  Other Topics Concern   Not on file  Social History Narrative   Not on file   Social Determinants of Health   Financial Resource Strain: Not on file  Food Insecurity: Not on file  Transportation Needs: Not on file  Physical Activity: Not on file  Stress: Not on file  Social Connections: Not on file  Intimate Partner Violence: Not on file    Review of Systems: See HPI, all other systems reviewed and are negative  Physical Exam: Vital signs in last 24 hours: Temp:  [98 F (36.7 C)-98.8 F (37.1 C)] 98.8 F (37.1 C) (12/23 0847) Pulse Rate:  [75-94] 94 (12/23 1130) Resp:  [15-36] 20 (12/23 1130) BP: (107-148)/(72-96) 112/72 (12/23 1130) SpO2:  [94 %-100 %] 100 % (12/23 1130) Weight:  [34.9 kg] 34.9 kg (12/23 0911)   General: Alert cachectic appearing female in no acute distress.   Head:  Normocephalic and atraumatic. Eyes:  No scleral icterus. Conjunctiva pink. Ears:  Normal auditory acuity. Nose:  No deformity, discharge or lesions. Mouth:  Dentition intact. No ulcers or lesions.  Neck:  Supple. No lymphadenopathy or thyromegaly.  Lungs: Breath sounds clear, diminished in the bases.  She is on oxygen 2 to 3 L nasal cannula. Heart: Regular rate and rhythm, no murmurs. Abdomen: Flat abdomen, lack of adipose tissue.  No mass.  Nontender.  J-tube site intact.  Hypoactive bowel sounds to all 4 quadrants. Rectal: Deferred. Musculoskeletal:  Symmetrical without gross deformities.  Pulses:  Normal pulses noted. Extremities:  Without clubbing or edema. Neurologic:  Alert and  oriented x4. No focal  deficits.  Skin:  Intact without significant lesions or rashes. Psych:  Alert and cooperative. Normal mood and affect.  Intake/Output from previous day: 12/22 0701 - 12/23 0700 In: 350 [IV Piggyback:350] Out: 700 [Urine:700] Intake/Output this shift: Total  I/O In: 497.9 [IV Piggyback:497.9] Out: -   Lab Results: Recent Labs    01/03/21 0004 01/03/21 0755  WBC 11.9* 11.4*  HGB 11.3* 11.5*  HCT 36.2 36.8  PLT 284 276   BMET Recent Labs    01/03/21 0004 01/03/21 0755  NA 140  --   K 3.7  --   CL 114*  --   CO2 17*  --   GLUCOSE 130*  --   BUN 23*  --   CREATININE 0.75 0.86  CALCIUM 8.4*  --    LFT Recent Labs    01/03/21 0004  PROT 5.6*  ALBUMIN 2.7*  AST 21  ALT 19  ALKPHOS 103  BILITOT 0.7   PT/INR No results for input(s): LABPROT, INR in the last 72 hours. Hepatitis Panel No results for input(s): HEPBSAG, HCVAB, HEPAIGM, HEPBIGM in the last 72 hours.    Studies/Results: DG Abd 1 View  Result Date: 01/03/2021 CLINICAL DATA:  Abdominal pain. EXAM: ABDOMEN - 1 VIEW COMPARISON:  None. FINDINGS: No gaseous bowel dilatation to suggest obstruction. Apparent left-sided surgical feeding catheter. Sacral stimulator device noted. Contrast material in the bladder compatible with CT chest earlier today. IMPRESSION: No evidence of bowel obstruction. Electronically Signed   By: Misty Stanley M.D.   On: 01/03/2021 08:05   CT Angio Chest PE W and/or Wo Contrast  Result Date: 01/03/2021 CLINICAL DATA:  Pulmonary embolism suspected, high probability. Cough, dyspnea, shortness of breath. EXAM: CT ANGIOGRAPHY CHEST WITH CONTRAST TECHNIQUE: Multidetector CT imaging of the chest was performed using the standard protocol during bolus administration of intravenous contrast. Multiplanar CT image reconstructions and MIPs were obtained to evaluate the vascular anatomy. CONTRAST:  80m OMNIPAQUE IOHEXOL 350 MG/ML SOLN COMPARISON:  07/07/2019. FINDINGS: Cardiovascular: The heart is normal in size and there is no pericardial effusion. The aorta and pulmonary trunk are normal in caliber. No pulmonary artery filling defect is identified. Mediastinum/Nodes: Prominent lymph nodes are present in the subcarinal space and right hilum measuring up to 1  cm in short axis diameter. No left hilar adenopathy. No axillary lymphadenopathy bilaterally. Thyroid gland and trachea are within normal limits. There is thickening of the walls of the mid to distal esophagus. Lungs/Pleura: Fibrotic changes are noted in the lungs bilaterally. There are hazy ground-glass airspace opacities bilaterally, greater on the right than on the left. No effusion or pneumothorax. Upper Abdomen: Surgical changes are present in the epigastric region. There is a small amount of perihepatic free fluid. Musculoskeletal: Old healed rib fractures are noted on the right. Compression fractures are noted at T7, T8, T11, and T12. The fracture at T7 is unchanged from the previous exam. Review of the MIP images confirms the above findings. IMPRESSION: 1. No evidence of pulmonary embolism. 2. Extensive ground-glass opacities in the lungs bilaterally, possible edema or infiltrate. 3. Marked thickening of the walls of the mid to distal esophagus. Endoscopy is recommended for further evaluation. 4. Stable compression deformity at T7. There are compression deformities in the thoracic spine at T8, T11, and T12, indeterminate in age. 5. Small amount of free fluid in the left upper quadrant. Electronically Signed   By: LBrett FairyM.D.   On: 01/03/2021 02:41   [facility]  Result Date: 01/02/2021 CLINICAL DATA:  Cough, dyspnea EXAM: PORTABLE CHEST 1 VIEW COMPARISON:  09/11/2019 FINDINGS: Lung volumes are small, however, pulmonary insufflation is symmetric and stable since prior examination. Superimposed predominantly mid and lower lung zone interstitial and airspace infiltrate has significantly progressed in the interval since prior examination, likely inflammatory in nature. Noncardiogenic pulmonary edema is considered less likely. No pneumothorax or pleural effusion. Cardiac size within normal limits. Pulmonary vascularity is normal. IMPRESSION: Marked interval progression of mid and lower lung  zone predominant pulmonary infiltrate, favored to be inflammatory. This would be better assessed with nonemergent high-resolution CT examination of the chest, if clinically indicated. Electronically Signed   By: Fidela Salisbury M.D.   On: 01/02/2021 23:48    IMPRESSION/PLAN:  31) 50 year old female with a history of peptic ulcer disease, gastric outlet obstruction s/p partial gastrectomy with vagotomy 2018 with severe postsurgical gastroparesis requiring J-tube placement and tube feeds presented to the ED with SOB, poor po intake and oral phase dysphagia.  -Await SLP results/recommendations  -EGD when respiratory status stable -Recommend nutritional consult p.o. intake and tube feeding -IV fluids -Ondansetron 4 mg p.o. or IV every 6 hours as needed for nausea -CBC, BMP in a.m. -Consider general surgery consult during this admission to consider a diverting proximal gastrojejunostomy for her severe gastroparesis -Await further recommendations per Dr. Rush Landmark  2) Acute respiratory failure. CT scan showing extensive bilateral groundglass opacity concerning for pneumonia versus fluid.  On antibiotics. Received Lasix.   3) Normocytic anemia, likely due to malnutrition.  No overt GI bleed -CBC, iron, ferritin level in a.m.  Patrecia Pour Kennedy-Smith  01/03/2021, 2:23PM

## 2021-01-03 NOTE — ED Notes (Signed)
Changed pt's linen, repositioned pt in bed, readjusted purewick.

## 2021-01-03 NOTE — Evaluation (Signed)
Clinical/Bedside Swallow Evaluation Patient Details  Name: Zohra Clavel MRN: [id number] Date of Birth: 1970/06/02  Today's Date: 01/03/2021 Time: SLP Start Time (ACUTE ONLY): 1300 SLP Stop Time (ACUTE ONLY): 1330 SLP Time Calculation (min) (ACUTE ONLY): 30 min  Past Medical History:  Past Medical History:  Diagnosis Date   Anxiety    GERD (gastroesophageal reflux disease)    Past Surgical History:  Past Surgical History:  Procedure Laterality Date   PARTIAL GASTRECTOMY     HPI:  50 y.o. female presented to ED with acute respiratory failure with hypoxia. Bilateral infiltrative process likely related to chronic aspiration and possibly an acute aspiration event per Pulmonary note. PMHx of profound malnutrition, GERD with severe esophagitis, vomiting, poor esophageal clearance, prior PUD causing gastric outlet obstruction, status post partial gastrectomy and bilateral vagotomy 2018, J-tube, recently replaced 12/27/20 due to leak. Followed by [facility] - 12/10/20 last visit.   Pt reports eating some solid foods, generally at night because it is less likely to "come back up."  Has identified certain foods that she can tolerate, and these may change every few months.  She can eat [employer] burgers and whoppers without mayo.  She drinks clear liquids - Mtn. Dew is her preference, and she stated she knows it's not good for her, but she can handle it better than other drinks.    Assessment / Plan / Recommendation  Clinical Impression  Ms. Rosiland Oz participated in a clinical swallow evaluation - she demonstrated no s/sx of an oropharyngeal dysphagia.  Oral mechanism exam was normal; no focal deficits.  No s/sx of aspiration.  Swallow response was palpable and appeared to be timely. Pt self-limits quantity/pacing so that she doesn't overload her esophagus and create issues with backflow or vomiting.  There do not appear to be biomechanical issues predisposing her to prandial aspiration - doubt presence of  an oropharyngeal dysphagia.  We discussed strategies that she is all too familiar with but that offer little reprieve from her symptoms - avoiding PO intake two hours before bedtime, elevating HOB (not just neck/head but trunk).  Ms. Schmuck acknowledged being overwhelmed, depressed, and wanting to be home with her son for Christmas.  Offered active listening and support.  Recommend continued follow-up with GI; strategies to reduce potential for nocturnal aspiration; continued POs as tolerated.  Unfortunately, there is little else SLP can offer to help - our service will respectfullly sign off.   SLP Visit Diagnosis: Dysphagia, unspecified (R13.10)           Diet Recommendation   Clear liquids or per pt's preference  Medication Administration: Via alternative means    Other  Recommendations Oral Care Recommendations: Oral care BID    Recommendations for follow up therapy are one component of a multi-disciplinary discharge planning process, led by the attending physician.  Recommendations may be updated based on patient status, additional functional criteria and insurance authorization.  Follow up Recommendations No SLP follow up        Swallow Study   General HPI: 50 y.o. female presented to ED with acute respiratory failure with hypoxia. Bilateral infiltrative process likely related to chronic aspiration and possibly an acute aspiration event per Pulmonary note. PMHx of profound malnutrition, GERD with severe esophagitis, vomiting, poor esophageal clearance, prior PUD causing gastric outlet obstruction, status post partial gastrectomy and bilateral vagotomy 2018, J-tube, recently replaced 12/27/20 due to leak. Followed by [facility] - 12/10/20 last visit.   Pt reports eating some solid foods, generally at night  because it is less likely to "come back up."  Has identified certain foods that she can tolerate, and these may change every few months.  She can eat [employer] burgers and whoppers  without mayo.  She drinks clear liquids - Mtn. Dew is her preference, and she stated she knows it's not good for her, but she can handle it better than other drinks. Type of Study: Bedside Swallow Evaluation Previous Swallow Assessment: no Diet Prior to this Study: Thin liquids Temperature Spikes Noted: No Respiratory Status: Room air History of Recent Intubation: No Behavior/Cognition: Alert;Cooperative Oral Cavity Assessment: Within Functional Limits Oral Care Completed by SLP: Other (Comment) (pt completed her own oral care after given supplies and while clinician in room) Oral Cavity - Dentition: Missing dentition Vision: Functional for self-feeding Self-Feeding Abilities: Able to feed self Patient Positioning: Upright in bed Baseline Vocal Quality: Normal Volitional Cough: Strong    Oral/Motor/Sensory Function Overall Oral Motor/Sensory Function: Within functional limits   Ice Chips Ice chips: Not tested   Thin Liquid Thin Liquid: Within functional limits    Nectar Thick Nectar Thick Liquid: Not tested   Honey Thick Honey Thick Liquid: Not tested   Puree Puree: Not tested   Solid     Solid: Not tested      Juan Quam Laurice 01/03/2021,1:53 PM  Estill Bamberg L. Tivis Ringer, [place] Office number [phone] Pager [phone]

## 2021-01-03 NOTE — H&P (Signed)
History and Physical    [name] WKG:[id number] DOB: [birth year] DOA: 01/02/2021  PCP: Cyndi Bender, PA-C  Patient coming from: Home.  Chief Complaint: Shortness of breath.  HPI: [name] is a 50 y.o. female with history of gastric outlet obstruction secondary to peptic ulcer disease status post partial gastrectomy vagotomy with severe malnutrition presently on G-tube feedings has been experiencing shortness of breath for the last 2 days with cough.  Had gone to her primary care physician and over the patient was concerning for influenza and pneumonia was referred to the ER.  Patient states she was treated for pneumonia with Z-Pak 2 weeks ago.  At the time patient states she was not short short of breath.  Denies any chest pain.  Has been having nausea vomiting which patient states is chronic.  Denies any abdominal discomfort.  ED Course: In the ER patient was initially placed on nonrebreather but was able to be weaned off to 6 L.  CT angiogram of the chest was negative for pulm embolism but does show bilateral extensive infiltrates concerning for pneumonia versus fluid.  Labs show WBC count of 11.9 BNP of 295 high sensitive troponin of 28 and 20.  Bicarb is 17.  Albumin 2.7.  Pregnancy screen is pending.  COVID test negative.  Influenza test is negative in the ER.  Patient was started on empiric antibiotics and 1 dose of Lasix 20 mg IV was given.  Patient admitted for further management of acute respiratory failure with hypoxia cause not clear.  Review of Systems: As per HPI, rest all negative.   Past Medical History:  Diagnosis Date   Anxiety    GERD (gastroesophageal reflux disease)     Past Surgical History:  Procedure Laterality Date   PARTIAL GASTRECTOMY       reports that she has been smoking cigarettes. She has never used smokeless tobacco. She reports that she does not currently use alcohol. She reports that she does not use drugs.  Allergies  Allergen Reactions    Morphine And Related    Sulfa Antibiotics     Family History  Family history unknown: Yes    Prior to Admission medications   Medication Sig Start Date End Date Taking? Authorizing Provider  albuterol (VENTOLIN HFA) 108 (90 Base) MCG/ACT inhaler Inhale 1-2 puffs into the lungs 4 (four) times daily as needed for shortness of breath or wheezing. 12/26/20  Yes [provider]  busPIRone (BUSPAR) 15 MG tablet Take 15 mg by mouth 2 (two) times daily. 12/28/20  Yes [provider]  cyclobenzaprine (FLEXERIL) 5 MG tablet Take 5 mg by mouth 3 (three) times daily as needed for muscle spasms. 12/19/20  Yes [provider]  dronabinol (MARINOL) 2.5 MG capsule Take 2.5 mg by mouth daily. 12/30/20  Yes [provider]  gabapentin (NEURONTIN) 800 MG tablet Take 800 mg by mouth 3 (three) times daily. 12/16/20  Yes [provider]  hydrOXYzine (ATARAX) 25 MG tablet Take 50 mg by mouth at bedtime. 12/14/20  Yes [provider]  naloxone (NARCAN) nasal spray 4 mg/0.1 mL Place 1 spray into the nose once as needed. 10/08/20  Yes [provider]  ondansetron (ZOFRAN-ODT) 4 MG disintegrating tablet Take 4 mg by mouth every 6 (six) hours as needed for nausea or vomiting. 10/01/20  Yes [provider]  oxyCODONE-acetaminophen (PERCOCET) 7.5-325 MG tablet Take 1 tablet by mouth 4 (four) times daily. 12/18/20  Yes [provider]  QUEtiapine (SEROQUEL) 200  MG tablet Take 200 mg by mouth at bedtime. 12/14/20  Yes [provider]  traZODone (DESYREL) 50 MG tablet Take 100 mg by mouth at bedtime. 10/11/20  Yes [provider]    Physical Exam: Constitutional: Moderately built and poorly nourished. Vitals:   01/03/21 0400 01/03/21 0415 01/03/21 0445 01/03/21 0500  BP: 136/78 138/88 131/90 137/89  Pulse: 83 81 81 84  Resp: 20 (!) 30 (!) 22 (!) 24  Temp:      TempSrc:      SpO2: 98% 100% 97% 99%   Eyes: Anicteric no  pallor. ENMT: No discharge from the ears eyes nose and mouth. Neck: No mass felt.  No neck rigidity.  JVD not appreciated. Respiratory: No rhonchi or crepitations. Cardiovascular: S1-S2 heard. Abdomen: Soft mild tenderness around the PEG tube.  No guarding or rigidity. Musculoskeletal: No edema. Skin: No rash. Neurologic: Alert awake oriented to time place and person.  Moves all extremities. Psychiatric: Appears normal.  Normal affect.   Labs on Admission: I have personally reviewed following labs and imaging studies  CBC: Recent Labs  Lab 01/03/21 0004  WBC 11.9*  NEUTROABS 10.1*  HGB 11.3*  HCT 36.2  MCV 85.0  PLT 563   Basic Metabolic Panel: Recent Labs  Lab 01/03/21 0004  NA 140  K 3.7  CL 114*  CO2 17*  GLUCOSE 130*  BUN 23*  CREATININE 0.75  CALCIUM 8.4*   GFR: CrCl cannot be calculated (Unknown ideal weight.). Liver Function Tests: Recent Labs  Lab 01/03/21 0004  AST 21  ALT 19  ALKPHOS 103  BILITOT 0.7  PROT 5.6*  ALBUMIN 2.7*   No results for input(s): LIPASE, AMYLASE in the last 168 hours. No results for input(s): AMMONIA in the last 168 hours. Coagulation Profile: No results for input(s): INR, PROTIME in the last 168 hours. Cardiac Enzymes: No results for input(s): CKTOTAL, CKMB, CKMBINDEX, TROPONINI in the last 168 hours. BNP (last 3 results) No results for input(s): PROBNP in the last 8760 hours. HbA1C: No results for input(s): HGBA1C in the last 72 hours. CBG: No results for input(s): GLUCAP in the last 168 hours. Lipid Profile: No results for input(s): CHOL, HDL, LDLCALC, TRIG, CHOLHDL, LDLDIRECT in the last 72 hours. Thyroid Function Tests: No results for input(s): TSH, T4TOTAL, FREET4, T3FREE, THYROIDAB in the last 72 hours. Anemia Panel: No results for input(s): VITAMINB12, FOLATE, FERRITIN, TIBC, IRON, RETICCTPCT in the last 72 hours. Urine analysis: No results found for: COLORURINE, APPEARANCEUR, LABSPEC, PHURINE, GLUCOSEU,  HGBUR, BILIRUBINUR, KETONESUR, PROTEINUR, UROBILINOGEN, NITRITE, LEUKOCYTESUR Sepsis Labs: @LABRCNTIP (procalcitonin:4,lacticidven:4) ) Recent Results (from the past 240 hour(s))  Resp Panel by RT-PCR (Flu A&B, Covid) Nasopharyngeal Swab     Status: None   Collection Time: 01/03/21 12:06 AM   Specimen: Nasopharyngeal Swab; Nasopharyngeal(NP) swabs in vial transport medium  Result Value Ref Range Status   SARS Coronavirus 2 by RT PCR NEGATIVE NEGATIVE Final    Comment: (NOTE) SARS-CoV-2 target nucleic acids are NOT DETECTED.  The SARS-CoV-2 RNA is generally detectable in upper respiratory specimens during the acute phase of infection. The lowest concentration of SARS-CoV-2 viral copies this assay can detect is 138 copies/mL. A negative result does not preclude SARS-Cov-2 infection and should not be used as the sole basis for treatment or other patient management decisions. A negative result may occur with  improper specimen collection/handling, submission of specimen other than nasopharyngeal swab, presence of viral mutation(s) within the areas targeted by this assay, and inadequate number of  viral copies(<138 copies/mL). A negative result must be combined with clinical observations, patient history, and epidemiological information. The expected result is Negative.  Fact Sheet for Patients:  EntrepreneurPulse.com.au  Fact Sheet for Healthcare Providers:  IncredibleEmployment.be  This test is no t yet approved or cleared by the [place] FDA and  has been authorized for detection and/or diagnosis of SARS-CoV-2 by FDA under an Emergency Use Authorization (EUA). This EUA will remain  in effect (meaning this test can be used) for the duration of the COVID-19 declaration under Section 564(b)(1) of the Act, 21 U.S.C.section 360bbb-3(b)(1), unless the authorization is terminated  or revoked sooner.       Influenza A by PCR NEGATIVE NEGATIVE  Final   Influenza B by PCR NEGATIVE NEGATIVE Final    Comment: (NOTE) The Xpert Xpress SARS-CoV-2/FLU/RSV plus assay is intended as an aid in the diagnosis of influenza from Nasopharyngeal swab specimens and should not be used as a sole basis for treatment. Nasal washings and aspirates are unacceptable for Xpert Xpress SARS-CoV-2/FLU/RSV testing.  Fact Sheet for Patients: EntrepreneurPulse.com.au  Fact Sheet for Healthcare Providers: IncredibleEmployment.be  This test is not yet approved or cleared by the [place] FDA and has been authorized for detection and/or diagnosis of SARS-CoV-2 by FDA under an Emergency Use Authorization (EUA). This EUA will remain in effect (meaning this test can be used) for the duration of the COVID-19 declaration under Section 564(b)(1) of the Act, 21 U.S.C. section 360bbb-3(b)(1), unless the authorization is terminated or revoked.  Performed at [facility], [address] [place], [place] [address]      Radiological Exams on Admission: CT Angio Chest PE W and/or Wo Contrast  Result Date: 01/03/2021 CLINICAL DATA:  Pulmonary embolism suspected, high probability. Cough, dyspnea, shortness of breath. EXAM: CT ANGIOGRAPHY CHEST WITH CONTRAST TECHNIQUE: Multidetector CT imaging of the chest was performed using the standard protocol during bolus administration of intravenous contrast. Multiplanar CT image reconstructions and MIPs were obtained to evaluate the vascular anatomy. CONTRAST:  41mL OMNIPAQUE IOHEXOL 350 MG/ML SOLN COMPARISON:  07/07/2019. FINDINGS: Cardiovascular: The heart is normal in size and there is no pericardial effusion. The aorta and pulmonary trunk are normal in caliber. No pulmonary artery filling defect is identified. Mediastinum/Nodes: Prominent lymph nodes are present in the subcarinal space and right hilum measuring up to 1 cm in short axis diameter. No left hilar adenopathy. No axillary  lymphadenopathy bilaterally. Thyroid gland and trachea are within normal limits. There is thickening of the walls of the mid to distal esophagus. Lungs/Pleura: Fibrotic changes are noted in the lungs bilaterally. There are hazy ground-glass airspace opacities bilaterally, greater on the right than on the left. No effusion or pneumothorax. Upper Abdomen: Surgical changes are present in the epigastric region. There is a small amount of perihepatic free fluid. Musculoskeletal: Old healed rib fractures are noted on the right. Compression fractures are noted at T7, T8, T11, and T12. The fracture at T7 is unchanged from the previous exam. Review of the MIP images confirms the above findings. IMPRESSION: 1. No evidence of pulmonary embolism. 2. Extensive ground-glass opacities in the lungs bilaterally, possible edema or infiltrate. 3. Marked thickening of the walls of the mid to distal esophagus. Endoscopy is recommended for further evaluation. 4. Stable compression deformity at T7. There are compression deformities in the thoracic spine at T8, T11, and T12, indeterminate in age. 5. Small amount of free fluid in the left upper quadrant. Electronically Signed   By: Mickel Baas  Lovena Le M.D.   On: 01/03/2021 02:41   DG Chest Port 1 View  Result Date: 01/02/2021 CLINICAL DATA:  Cough, dyspnea EXAM: PORTABLE CHEST 1 VIEW COMPARISON:  09/11/2019 FINDINGS: Lung volumes are small, however, pulmonary insufflation is symmetric and stable since prior examination. Superimposed predominantly mid and lower lung zone interstitial and airspace infiltrate has significantly progressed in the interval since prior examination, likely inflammatory in nature. Noncardiogenic pulmonary edema is considered less likely. No pneumothorax or pleural effusion. Cardiac size within normal limits. Pulmonary vascularity is normal. IMPRESSION: Marked interval progression of mid and lower lung zone predominant pulmonary infiltrate, favored to be inflammatory.  This would be better assessed with nonemergent high-resolution CT examination of the chest, if clinically indicated. Electronically Signed   By: Fidela Salisbury M.D.   On: 01/02/2021 23:48      Assessment/Plan Principal Problem:   Acute respiratory failure with hypoxia (HCC) Active Problems:   Normochromic normocytic anemia    Acute respiratory failure with hypoxia presently on 6 L oxygen with CT scan showing extensive bilateral groundglass opacity concerning for pneumonia versus fluid.  Patient has been empirically started on antibiotics for pneumonia and 1 dose of Lasix 20 mg IV is being given.  We will check sputum cultures 2D echo response to Lasix.  We will consult pulmonary critical care. History of partial gastrectomy with vagotomy for gastric outlet obstruction subsequently which patient had severe malnutrition and presently on G-tube feeds.  Will request pharmacy to address the G-tube feeds.  Patient is on Protonix and Marinol. History of anxiety on BuSpar. Normocytic normochromic anemia could be from nutrition.  Check anemia panel with next blood draw.  As patient has acute respiratory failure requiring 6 L oxygen with bilateral infiltrates will need close monitoring for any further worsening and inpatient status.   Pregnancy screen is pending.   DVT prophylaxis: Lovenox. Code Status: Full code. Family Communication: Patient's family at the bedside. Disposition Plan: Home when stable. Consults called: We will consult pulmonologist. Admission status: Inpatient.   Rise Patience MD [facility] Pager [phone].  If 7PM-7AM, please contact night-coverage [email or web address] Password [facility]  01/03/2021, 5:19 AM

## 2021-01-04 DIAGNOSIS — E870 Hyperosmolality and hypernatremia: Secondary | ICD-10-CM

## 2021-01-04 DIAGNOSIS — K3184 Gastroparesis: Secondary | ICD-10-CM

## 2021-01-04 LAB — CBC
HCT: 36.5 % (ref 36.0–46.0)
Hemoglobin: 11.2 g/dL — ABNORMAL LOW (ref 12.0–15.0)
MCH: 26.4 pg (ref 26.0–34.0)
MCHC: 30.7 g/dL (ref 30.0–36.0)
MCV: 85.9 fL (ref 80.0–100.0)
Platelets: 329 10*3/uL (ref 150–400)
RBC: 4.25 MIL/uL (ref 3.87–5.11)
RDW: 18.2 % — ABNORMAL HIGH (ref 11.5–15.5)
WBC: 13.4 10*3/uL — ABNORMAL HIGH (ref 4.0–10.5)
nRBC: 0 % (ref 0.0–0.2)

## 2021-01-04 LAB — BASIC METABOLIC PANEL
Anion gap: 11 (ref 5–15)
BUN: 23 mg/dL — ABNORMAL HIGH (ref 6–20)
CO2: 21 mmol/L — ABNORMAL LOW (ref 22–32)
Calcium: 8.8 mg/dL — ABNORMAL LOW (ref 8.9–10.3)
Chloride: 116 mmol/L — ABNORMAL HIGH (ref 98–111)
Creatinine, Ser: 0.86 mg/dL (ref 0.44–1.00)
GFR, Estimated: >60 mL/min (ref >60)
Glucose, Bld: 133 mg/dL — ABNORMAL HIGH (ref 70–99)
Potassium: 3.6 mmol/L (ref 3.5–5.1)
Sodium: 148 mmol/L — ABNORMAL HIGH (ref 135–145)

## 2021-01-04 LAB — MAGNESIUM: Magnesium: 2.3 mg/dL (ref 1.7–2.4)

## 2021-01-04 LAB — PHOSPHORUS: Phosphorus: 3.2 mg/dL (ref 2.5–4.6)

## 2021-01-04 MED ORDER — FREE WATER
100.0000 mL | Status: DC
  Administered 2021-01-04 – 2021-01-05 (×3): 100 mL

## 2021-01-04 MED ORDER — SODIUM CHLORIDE 0.45 % IV SOLN: INTRAVENOUS | Status: AC

## 2021-01-04 MED ORDER — OSMOLITE 1.2 CAL PO LIQD: 1080.0000 mL | ORAL | Status: DC

## 2021-01-04 MED ORDER — NICOTINE 21 MG/24HR TD PT24
21.0000 mg | MEDICATED_PATCH | Freq: Every day | TRANSDERMAL | Status: DC
  Administered 2021-01-04 – 2021-01-05 (×2): 21 mg via TRANSDERMAL
  Filled 2021-01-04 (×2): qty 1

## 2021-01-04 MED ORDER — SODIUM CHLORIDE 0.9 % IV SOLN
12.5000 mg | Freq: Once | INTRAVENOUS | Status: DC
  Filled 2021-01-04: qty 0.5

## 2021-01-04 MED ORDER — ENOXAPARIN SODIUM 300 MG/3ML IJ SOLN
20.0000 mg | INTRAMUSCULAR | Status: DC
  Administered 2021-01-05: 09:00:00 20 mg via SUBCUTANEOUS
  Filled 2021-01-04: qty 0.2

## 2021-01-04 MED ORDER — OSMOLITE 1.2 CAL PO LIQD
1080.0000 mL | ORAL | Status: DC
  Filled 2021-01-04 (×2): qty 2000

## 2021-01-04 MED ORDER — ADULT MULTIVITAMIN W/MINERALS CH
1.0000 | ORAL_TABLET | Freq: Every day | ORAL | Status: DC
  Administered 2021-01-04 – 2021-01-05 (×2): 1 via ORAL
  Filled 2021-01-04 (×2): qty 1

## 2021-01-04 NOTE — Progress Notes (Signed)
Patient refused tube feeding Dr. Donalee Citrin notified.

## 2021-01-04 NOTE — Progress Notes (Addendum)
PROGRESS NOTE        PATIENT DETAILS Name: [name] Age: 50 y.o. Sex: female Date of Birth: 1970/12/25 Admit Date: 01/02/2021 Admitting Physician Rise Patience, MD KNL:[id number], Ovid Curd, [place]  Brief Narrative: Patient is a 50 y.o. female with history of peptic ulcer disease causing gastric outlet obstruction-s/p partial gastrectomy/bilateral vagotomy in 2018, severe postsurgical gastroparesis, chronic constipation, hypothyroidism, depression, malnutrition with G-tube in place-presented with shortness of breath-found to have acute hypoxic respiratory failure secondary to extensive bilateral pulmonary infiltrates  Subjective: Continues to have some nausea which is chronic.  Finally had a bowel movement this morning-after several days (which is her usual baseline).  Still on 3 L of oxygen.  Appears very frail/chronically sick looking and disheveled.  Was anxious to go home-but once her rationale for hospitalization was explained-she reluctantly agreed to stay.  Objective: Vitals: Blood pressure (!) 144/91, pulse 87, temperature 98.2 F (36.8 C), temperature source Oral, resp. rate 12, height 4' 11.5" (1.511 m), weight 34.9 kg, SpO2 96 %.   Exam: Gen Exam:Alert awake-not in any distress HEENT:atraumatic, normocephalic Chest: B/L clear to auscultation anteriorly CVS:S1S2 regular Abdomen:soft non tender, non distended Extremities:no edema Neurology: Non focal Skin: no rash   Pertinent Labs/Radiology: Recent Labs  Lab 01/03/21 0004 01/03/21 0755 01/04/21 0303  WBC 11.9*   < > 13.4*  HGB 11.3*   < > 11.2*  PLT 284   < > 329  NA 140  --  148*  K 3.7  --  3.6  CREATININE 0.75   < > 0.86  AST 21  --   --   ALT 19  --   --   ALKPHOS 103  --   --   BILITOT 0.7  --   --    < > = values in this interval not displayed.      Assessment/Plan: Acute hypoxic respiratory failure due to flare of underlying ILD due to chronic aspiration in the setting of  esophageal motility/gastroparesis: Overall improved-Down to 3 L of oxygen.  Appreciate PCCM input-recommendations are to continue steroids-x5 days, will continue Unasyn-maintain aspiration precautions.  Ambulate today-see how she does-suspect may require home O2-have asked RN to see if we can do a desaturation screen.  Echo with stable EF-not felt to have CHF at this point.  History of peptic ulcer disease causing gastric outlet obstruction-s/p partial gastrectomy/vagotomy with resultant postoperative gastroparesis/esophageal dysmotility syndrome: Follows with Dr. Hilarie Fredrickson in the outpatient setting-given significant worsening of hypoxemia-due to underlying ILD from chronic aspiration-GI consulted-Per GI note-patient prefers further work-up including EGD to be done in the outpatient setting.  Continue PPI/Carafate.  Encourage small portion meals-continue to use antiemetics as needed.  Hypernatremia: We will hydrate with hypotonic saline today-and recheck electrolytes tomorrow.  Severe malnutrition: On J-tube feedings at home-we will obtain nutrition evaluation.  On Marinol chronically to stimulate appetite.  History of mood disorder: Continue BuSpar/Seroquel/trazodone  Body mass index is 15.29 kg/m.   Procedures: None Consults: PCCM,GI DVT Prophylaxis: Lovenox Code Status:Full code  Family Communication: None at bedside  Time spent: 25 minutes-Greater than 50% of this time was spent in counseling, explanation of diagnosis, planning of further management, and coordination of care.  Disposition Plan: Status is: Inpatient  Remains inpatient appropriate because: Hypoxia due to significant amount of pulmonary infiltrates-suspicion for aspiration issues-see above documentation-and not yet stable for discharge.  Diet: Diet Order             Diet clear liquid Room service appropriate? Yes; Fluid consistency: Thin  Diet effective now                     Antimicrobial  agents: Anti-infectives (From admission, onward)    Start     Dose/Rate Route Frequency Ordered Stop   01/03/21 0845  Ampicillin-Sulbactam (UNASYN) 3 g in sodium chloride 0.9 % 100 mL IVPB        3 g 200 mL/hr over 30 Minutes Intravenous Every 6 hours 01/03/21 0837     01/03/21 0600  cefTRIAXone (ROCEPHIN) 2 g in sodium chloride 0.9 % 100 mL IVPB  Status:  Discontinued        2 g 200 mL/hr over 30 Minutes Intravenous Every 24 hours 01/03/21 0518 01/03/21 0833   01/03/21 0600  azithromycin (ZITHROMAX) 500 mg in sodium chloride 0.9 % 250 mL IVPB        500 mg 250 mL/hr over 60 Minutes Intravenous Every 24 hours 01/03/21 0518 01/08/21 0559   01/03/21 0300  cefTRIAXone (ROCEPHIN) 1 g in sodium chloride 0.9 % 100 mL IVPB        1 g 200 mL/hr over 30 Minutes Intravenous  Once 01/03/21 0251 01/03/21 0358   01/03/21 0300  azithromycin (ZITHROMAX) 500 mg in sodium chloride 0.9 % 250 mL IVPB        500 mg 250 mL/hr over 60 Minutes Intravenous  Once 01/03/21 0251 01/03/21 0514        MEDICATIONS: Scheduled Meds:  busPIRone  15 mg Oral BID   dronabinol  2.5 mg Oral Daily   enoxaparin (LOVENOX) injection  40 mg Subcutaneous Q24H   feeding supplement (OSMOLITE 1.2 CAL)  1,080 mL Per Tube Q24H   free water  100 mL Per Tube Q4H   gabapentin  800 mg Oral TID   hydrOXYzine  50 mg Oral QHS   methylPREDNISolone (SOLU-MEDROL) injection  40 mg Intravenous Q12H   multivitamin with minerals  1 tablet Oral Daily   oxyCODONE-acetaminophen  1 tablet Oral QID   And   oxyCODONE  5 mg Oral QID   pantoprazole (PROTONIX) IV  40 mg Intravenous Q12H   QUEtiapine  200 mg Oral QHS   sucralfate  1 g Oral TID WC & HS   traZODone  100 mg Oral QHS   Continuous Infusions:  sodium chloride 50 mL/hr at 01/04/21 0950   ampicillin-sulbactam (UNASYN) IV 3 g (01/04/21 0226)   azithromycin 500 mg (01/04/21 0410)   promethazine (PHENERGAN) injection (IM or IVPB) 12.5 mg (01/04/21 0955)   PRN Meds:.acetaminophen  **OR** acetaminophen, albuterol, cyclobenzaprine, promethazine (PHENERGAN) injection (IM or IVPB)   I have personally reviewed following labs and imaging studies  LABORATORY DATA: CBC: Recent Labs  Lab 01/03/21 0004 01/03/21 0755 01/04/21 0303  WBC 11.9* 11.4* 13.4*  NEUTROABS 10.1*  --   --   HGB 11.3* 11.5* 11.2*  HCT 36.2 36.8 36.5  MCV 85.0 86.0 85.9  PLT 284 276 329     Basic Metabolic Panel: Recent Labs  Lab 01/03/21 0004 01/03/21 0755 01/04/21 0303  NA 140  --  148*  K 3.7  --  3.6  CL 114*  --  116*  CO2 17*  --  21*  GLUCOSE 130*  --  133*  BUN 23*  --  23*  CREATININE 0.75 0.86 0.86  CALCIUM 8.4*  --  8.8*  MG  --   --  2.3  PHOS  --   --  3.2     GFR: Estimated Creatinine Clearance: 44.1 mL/min (by C-G formula based on SCr of 0.86 mg/dL).  Liver Function Tests: Recent Labs  Lab 01/03/21 0004  AST 21  ALT 19  ALKPHOS 103  BILITOT 0.7  PROT 5.6*  ALBUMIN 2.7*    No results for input(s): LIPASE, AMYLASE in the last 168 hours. No results for input(s): AMMONIA in the last 168 hours.  Coagulation Profile: No results for input(s): INR, PROTIME in the last 168 hours.  Cardiac Enzymes: No results for input(s): CKTOTAL, CKMB, CKMBINDEX, TROPONINI in the last 168 hours.  BNP (last 3 results) No results for input(s): PROBNP in the last 8760 hours.  Lipid Profile: No results for input(s): CHOL, HDL, LDLCALC, TRIG, CHOLHDL, LDLDIRECT in the last 72 hours.  Thyroid Function Tests: No results for input(s): TSH, T4TOTAL, FREET4, T3FREE, THYROIDAB in the last 72 hours.  Anemia Panel: No results for input(s): VITAMINB12, FOLATE, FERRITIN, TIBC, IRON, RETICCTPCT in the last 72 hours.  Urine analysis: No results found for: COLORURINE, APPEARANCEUR, LABSPEC, PHURINE, GLUCOSEU, HGBUR, BILIRUBINUR, KETONESUR, PROTEINUR, UROBILINOGEN, NITRITE, LEUKOCYTESUR  Sepsis Labs: Lactic Acid, Venous No results found for: LATICACIDVEN  MICROBIOLOGY: Recent  Results (from the past 240 hour(s))  Resp Panel by RT-PCR (Flu A&B, Covid) Nasopharyngeal Swab     Status: None   Collection Time: 01/03/21 12:06 AM   Specimen: Nasopharyngeal Swab; Nasopharyngeal(NP) swabs in vial transport medium  Result Value Ref Range Status   SARS Coronavirus 2 by RT PCR NEGATIVE NEGATIVE Final    Comment: (NOTE) SARS-CoV-2 target nucleic acids are NOT DETECTED.  The SARS-CoV-2 RNA is generally detectable in upper respiratory specimens during the acute phase of infection. The lowest concentration of SARS-CoV-2 viral copies this assay can detect is 138 copies/mL. A negative result does not preclude SARS-Cov-2 infection and should not be used as the sole basis for treatment or other patient management decisions. A negative result may occur with  improper specimen collection/handling, submission of specimen other than nasopharyngeal swab, presence of viral mutation(s) within the areas targeted by this assay, and inadequate number of viral copies(<138 copies/mL). A negative result must be combined with clinical observations, patient history, and epidemiological information. The expected result is Negative.  Fact Sheet for Patients:  EntrepreneurPulse.com.au  Fact Sheet for Healthcare Providers:  IncredibleEmployment.be  This test is no t yet approved or cleared by the [place] FDA and  has been authorized for detection and/or diagnosis of SARS-CoV-2 by FDA under an Emergency Use Authorization (EUA). This EUA will remain  in effect (meaning this test can be used) for the duration of the COVID-19 declaration under Section 564(b)(1) of the Act, 21 U.S.C.section 360bbb-3(b)(1), unless the authorization is terminated  or revoked sooner.       Influenza A by PCR NEGATIVE NEGATIVE Final   Influenza B by PCR NEGATIVE NEGATIVE Final    Comment: (NOTE) The Xpert Xpress SARS-CoV-2/FLU/RSV plus assay is intended as an aid in the  diagnosis of influenza from Nasopharyngeal swab specimens and should not be used as a sole basis for treatment. Nasal washings and aspirates are unacceptable for Xpert Xpress SARS-CoV-2/FLU/RSV testing.  Fact Sheet for Patients: EntrepreneurPulse.com.au  Fact Sheet for Healthcare Providers: IncredibleEmployment.be  This test is not yet approved or cleared by the [place] FDA and has been authorized for detection and/or diagnosis of SARS-CoV-2 by FDA under an Emergency  Use Authorization (EUA). This EUA will remain in effect (meaning this test can be used) for the duration of the COVID-19 declaration under Section 564(b)(1) of the Act, 21 U.S.C. section 360bbb-3(b)(1), unless the authorization is terminated or revoked.  Performed at [facility], [place] [address], [place], [place] [address]   Respiratory (~20 pathogens) panel by PCR     Status: None   Collection Time: 01/03/21 12:06 AM   Specimen: Nasopharyngeal Swab; Respiratory  Result Value Ref Range Status   Adenovirus NOT DETECTED NOT DETECTED Final   Coronavirus 229E NOT DETECTED NOT DETECTED Final    Comment: (NOTE) The Coronavirus on the Respiratory Panel, DOES NOT test for the novel  Coronavirus (2019 nCoV)    Coronavirus HKU1 NOT DETECTED NOT DETECTED Final   Coronavirus NL63 NOT DETECTED NOT DETECTED Final   Coronavirus OC43 NOT DETECTED NOT DETECTED Final   Metapneumovirus NOT DETECTED NOT DETECTED Final   Rhinovirus / Enterovirus NOT DETECTED NOT DETECTED Final   Influenza A NOT DETECTED NOT DETECTED Final   Influenza B NOT DETECTED NOT DETECTED Final   Parainfluenza Virus 1 NOT DETECTED NOT DETECTED Final   Parainfluenza Virus 2 NOT DETECTED NOT DETECTED Final   Parainfluenza Virus 3 NOT DETECTED NOT DETECTED Final   Parainfluenza Virus 4 NOT DETECTED NOT DETECTED Final   Respiratory Syncytial Virus NOT DETECTED NOT DETECTED Final   Bordetella pertussis NOT DETECTED  NOT DETECTED Final   Bordetella Parapertussis NOT DETECTED NOT DETECTED Final   Chlamydophila pneumoniae NOT DETECTED NOT DETECTED Final   Mycoplasma pneumoniae NOT DETECTED NOT DETECTED Final    Comment: Performed at [facility], [place]. [address] [place], [place] [address]    RADIOLOGY STUDIES/RESULTS: DG Abd 1 View  Result Date: 01/03/2021 CLINICAL DATA:  Abdominal pain. EXAM: ABDOMEN - 1 VIEW COMPARISON:  None. FINDINGS: No gaseous bowel dilatation to suggest obstruction. Apparent left-sided surgical feeding catheter. Sacral stimulator device noted. Contrast material in the bladder compatible with CT chest earlier today. IMPRESSION: No evidence of bowel obstruction. Electronically Signed   By: Misty Stanley M.D.   On: 01/03/2021 08:05   CT Angio Chest PE W and/or Wo Contrast  Result Date: 01/03/2021 CLINICAL DATA:  Pulmonary embolism suspected, high probability. Cough, dyspnea, shortness of breath. EXAM: CT ANGIOGRAPHY CHEST WITH CONTRAST TECHNIQUE: Multidetector CT imaging of the chest was performed using the standard protocol during bolus administration of intravenous contrast. Multiplanar CT image reconstructions and MIPs were obtained to evaluate the vascular anatomy. CONTRAST:  23mL OMNIPAQUE IOHEXOL 350 MG/ML SOLN COMPARISON:  07/07/2019. FINDINGS: Cardiovascular: The heart is normal in size and there is no pericardial effusion. The aorta and pulmonary trunk are normal in caliber. No pulmonary artery filling defect is identified. Mediastinum/Nodes: Prominent lymph nodes are present in the subcarinal space and right hilum measuring up to 1 cm in short axis diameter. No left hilar adenopathy. No axillary lymphadenopathy bilaterally. Thyroid gland and trachea are within normal limits. There is thickening of the walls of the mid to distal esophagus. Lungs/Pleura: Fibrotic changes are noted in the lungs bilaterally. There are hazy ground-glass airspace opacities bilaterally, greater on the  right than on the left. No effusion or pneumothorax. Upper Abdomen: Surgical changes are present in the epigastric region. There is a small amount of perihepatic free fluid. Musculoskeletal: Old healed rib fractures are noted on the right. Compression fractures are noted at T7, T8, T11, and T12. The fracture at T7 is unchanged from the previous exam.  Review of the MIP images confirms the above findings. IMPRESSION: 1. No evidence of pulmonary embolism. 2. Extensive ground-glass opacities in the lungs bilaterally, possible edema or infiltrate. 3. Marked thickening of the walls of the mid to distal esophagus. Endoscopy is recommended for further evaluation. 4. Stable compression deformity at T7. There are compression deformities in the thoracic spine at T8, T11, and T12, indeterminate in age. 5. Small amount of free fluid in the left upper quadrant. Electronically Signed   By: Brett Fairy M.D.   On: 01/03/2021 02:41   DG Chest Port 1 View  Result Date: 01/02/2021 CLINICAL DATA:  Cough, dyspnea EXAM: PORTABLE CHEST 1 VIEW COMPARISON:  09/11/2019 FINDINGS: Lung volumes are small, however, pulmonary insufflation is symmetric and stable since prior examination. Superimposed predominantly mid and lower lung zone interstitial and airspace infiltrate has significantly progressed in the interval since prior examination, likely inflammatory in nature. Noncardiogenic pulmonary edema is considered less likely. No pneumothorax or pleural effusion. Cardiac size within normal limits. Pulmonary vascularity is normal. IMPRESSION: Marked interval progression of mid and lower lung zone predominant pulmonary infiltrate, favored to be inflammatory. This would be better assessed with nonemergent high-resolution CT examination of the chest, if clinically indicated. Electronically Signed   By: Fidela Salisbury M.D.   On: 01/02/2021 23:48   ECHOCARDIOGRAM COMPLETE  Result Date: 01/03/2021    ECHOCARDIOGRAM REPORT   Patient Name:    HILLARI ZUMWALT Date of Exam: 01/03/2021 Medical Rec #:  [id number]    Height: Accession #:    [id number]   Weight: Date of Birth:  Apr 22, 1970     BSA: Patient Age:    35 years     BP:           120/79 mmHg Patient Gender: F            HR:           81 bpm. Exam Location:  Inpatient Procedure: 2D Echo, Cardiac Doppler and Color Doppler Indications:    Dyspnea  History:        Patient has no prior history of Echocardiogram examinations.  Sonographer:    Glo Herring Referring Phys: [place]  1. Left ventricular ejection fraction, by estimation, is 60 to 65%. The left ventricle has normal function. The left ventricle has no regional wall motion abnormalities. Left ventricular diastolic parameters were normal.  2. Right ventricular systolic function is normal. The right ventricular size is normal.  3. The mitral valve is myxomatous. Mild to moderate mitral valve regurgitation. No evidence of mitral stenosis. There is mild late systolic prolapse of both leaflets of the mitral valve.  4. The tricuspid valve is myxomatous. Tricuspid valve regurgitation is mild to moderate.  5. The aortic valve is normal in structure. Aortic valve regurgitation is not visualized. No aortic stenosis is present.  6. The inferior vena cava is normal in size with greater than 50% respiratory variability, suggesting right atrial pressure of 3 mmHg. FINDINGS  Left Ventricle: Left ventricular ejection fraction, by estimation, is 60 to 65%. The left ventricle has normal function. The left ventricle has no regional wall motion abnormalities. The left ventricular internal cavity size was normal in size. There is  no left ventricular hypertrophy. Left ventricular diastolic parameters were normal. Normal left ventricular filling pressure. Right Ventricle: The right ventricular size is normal. No increase in right ventricular wall thickness. Right ventricular systolic function is normal. Left Atrium: Left atrial size was normal  in size.  Right Atrium: Right atrial size was normal in size. Pericardium: There is no evidence of pericardial effusion. Mitral Valve: The mitral valve is myxomatous. There is mild late systolic prolapse of both leaflets of the mitral valve. There is mild thickening of the mitral valve leaflet(s). Mild to moderate mitral valve regurgitation, with centrally-directed jet. No  evidence of mitral valve stenosis. Tricuspid Valve: The tricuspid valve is myxomatous. Tricuspid valve regurgitation is mild to moderate. No evidence of tricuspid stenosis. Aortic Valve: The aortic valve is normal in structure. Aortic valve regurgitation is not visualized. No aortic stenosis is present. Aortic valve mean gradient measures 3.0 mmHg. Aortic valve peak gradient measures 6.8 mmHg. Aortic valve area, by VTI measures 1.85 cm. Pulmonic Valve: The pulmonic valve was normal in structure. Pulmonic valve regurgitation is not visualized. No evidence of pulmonic stenosis. Aorta: The aortic root is normal in size and structure. Venous: The inferior vena cava is normal in size with greater than 50% respiratory variability, suggesting right atrial pressure of 3 mmHg. IAS/Shunts: No atrial level shunt detected by color flow Doppler.  LEFT VENTRICLE PLAX 2D LVIDd:         4.10 cm     Diastology LVIDs:         2.70 cm     LV e' medial:    10.10 cm/s LV PW:         0.80 cm     LV E/e' medial:  8.4 LV IVS:        0.80 cm     LV e' lateral:   11.90 cm/s LVOT diam:     1.70 cm     LV E/e' lateral: 7.2 LV SV:         52 LVOT Area:     2.27 cm  LV Volumes (MOD) LV vol d, MOD A2C: 42.5 ml LV vol d, MOD A4C: 69.4 ml LV vol s, MOD A2C: 16.2 ml LV vol s, MOD A4C: 27.5 ml LV SV MOD A2C:     26.3 ml LV SV MOD A4C:     69.4 ml LV SV MOD BP:      34.8 ml RIGHT VENTRICLE             IVC RV Basal diam:  3.30 cm     IVC diam: 1.90 cm RV Mid diam:    1.60 cm RV S prime:     10.90 cm/s LEFT ATRIUM             RIGHT ATRIUM LA diam:        3.70 cm RA Area:     13.30  cm LA Vol (A2C):   30.1 ml RA Volume:   29.80 ml LA Vol (A4C):   38.3 ml LA Biplane Vol: 37.2 ml  AORTIC VALVE                    PULMONIC VALVE AV Area (Vmax):    1.85 cm     PV Vmax:       0.83 m/s AV Area (Vmean):   1.62 cm     PV Peak grad:  2.8 mmHg AV Area (VTI):     1.85 cm AV Vmax:           130.00 cm/s AV Vmean:          84.800 cm/s AV VTI:            0.279 m AV Peak Grad:      6.8  mmHg AV Mean Grad:      3.0 mmHg LVOT Vmax:         106.00 cm/s LVOT Vmean:        60.700 cm/s LVOT VTI:          0.227 m LVOT/AV VTI ratio: 0.81  AORTA Ao Root diam: 2.90 cm Ao Asc diam:  2.70 cm MITRAL VALVE MV Area (PHT): 5.58 cm    SHUNTS MV Decel Time: 136 msec    Systemic VTI:  0.23 m MV E velocity: 85.30 cm/s  Systemic Diam: 1.70 cm MV A velocity: 71.60 cm/s MV E/A ratio:  1.19 Mihai Croitoru MD Electronically signed by Sanda Klein MD Signature Date/Time: 01/03/2021/1:16:23 PM    Final      LOS: 1 day   Oren Binet, MD  [facility]    To contact the attending provider between 7A-7P or the covering provider during after hours 7P-7A, please log into the web site [email or web address] and access using universal [place] password for that web site. If you do not have the password, please call the hospital operator.  01/04/2021, 11:16 AM

## 2021-01-04 NOTE — Progress Notes (Signed)
° °  NAME:  [name], MRN:  [id number], DOB:  1970-03-24, LOS: 1 ADMISSION DATE:  01/02/2021, CONSULTATION DATE:  01/03/2021 REFERRING MD:  dr ghimire, CHIEF COMPLAINT: Shortness of breath  History of Present Illness:  Patient was admitted with shortness of breath No fever, no contact with anybody with a febrile illness History of recurrent aspirations History of gastric outlet obstruction-partial gastrectomy/bilateral vagotomy, severe gastroparesis Known history of aspiration events Pertinent  Medical History   Past Medical History:  Diagnosis Date   Anxiety    GERD (gastroesophageal reflux disease)     Significant Hospital Events: Including procedures, antibiotic start and stop dates in addition to other pertinent events   CT scan of the chest with bilateral groundglass changes  Interim History / Subjective:  No acute issues. Feels a little dyspneic but better.   Objective   Blood pressure (!) 144/91, pulse 87, temperature 98.2 F (36.8 C), temperature source Oral, resp. rate 12, height 4' 11.5" (1.511 m), weight 34.9 kg, SpO2 96 %.        Intake/Output Summary (Last 24 hours) at 01/04/2021 0945 Last data filed at 01/03/2021 1037 Gross per 24 hour  Intake 397.93 ml  Output --  Net 397.93 ml   Filed Weights   01/03/21 0911  Weight: 34.9 kg    Examination: General: Middle-aged, chronically ill-appearing, thin seems withdrawn HENT: Dry oral mucosa Lungs: Decreased air movement bilaterally Cardiovascular: S1-S2 appreciated, RR no murmur Abdomen: Bowel sounds appreciated Extremities: No clubbing, no edema Neuro: Alert and oriented x3 GU: Fair output  Hypernatremic, bicarb 21  CTA Chest 12/28 reviewed by me remarkable for emphysema and widespread ggo, mediastinal LAD and patulous esophagus  Resolved Hospital Problem list     Assessment & Plan:  # Widespread GGO # Possible ILD - DIP favored # Exertional hypoxia Certainly a component of aspiration pneumonitis  but distribution would be odd for it, I think the majority may actually be due to desquamative interstitial pneumonia and she absolutely needs to stop smoking.  - would prescribe 5 day course of steroids, start prednisone 40 mg daily tomorrow. If feels substantially more dyspneic after this change, let us know - aspiration precautions - ABX per primary - walk her for oxygen before discharge - I'd like to see her in clinic this month to follow up on her effort at smoking cessation. If she's still dyspneic despite smoking cessation then we can consider trial of a longer term course of steroids but cornerstone of DIP treatment is smoking cessation. I will arrange clinic follow up, PFTs.  Will sign off but glad to be reinvolved as condition changes  Fredirick Maudlin Pulmonary/Critical Care  Pager: See Shea Evans

## 2021-01-04 NOTE — Progress Notes (Addendum)
Initial Nutrition Assessment  DOCUMENTATION CODES:   Underweight  INTERVENTION:  Transition to continuous TF via J-tube: -Osmolite 1.2 @ 15ml/hr (1074ml) -143ml free water Q4H -MVI with minerals daily  Provides 1296 kcals, 60 grams protein, 844ml free water (1420ml total free water with flushes)   NUTRITION DIAGNOSIS:   Inadequate oral intake related to altered GI function as evidenced by other (comment) (gastroparesis resulting in J-tube placement for feeding).  GOAL:   Patient will meet greater than or equal to 90% of their needs  MONITOR:   PO intake, Weight trends, Labs, I & O's, TF tolerance, Diet advancement  REASON FOR ASSESSMENT:   Consult Enteral/tube feeding initiation and management, Assessment of nutrition requirement/status  ASSESSMENT:   Pt with PMH significant for PUD c/b GOO s/p partial gastrectomy/vagotomy 2018 c/b gastroparesis s/p J-tube placement for FTT admitted with acute hypoxic respiratory failure 2/2 PNA vs fluid.  RD working remotely and unable to reach pt via phone. Will attempt to follow-up with pt during the week to confirm TF history as MD notes are conflicting.. some notes state pt receiving most of nutrition via feeding tube with POs for pleasure while others state pt was doing bolus night time feedings. Please note it is inappropriate to provide bolus feedings through a J-tube.   RD to transition pt to continuous TF via J-tube, especially considering pt is only on clear liquid diet at this time.   No weight history available for review.  No PO Intake documented   Medications: Scheduled Meds:  busPIRone  15 mg Oral BID   dronabinol  2.5 mg Oral Daily   enoxaparin (LOVENOX) injection  40 mg Subcutaneous Q24H   feeding supplement (OSMOLITE 1.2 CAL)  600 mL Per Tube Q24H   gabapentin  800 mg Oral TID   hydrOXYzine  50 mg Oral QHS   methylPREDNISolone (SOLU-MEDROL) injection  40 mg Intravenous Q12H   oxyCODONE-acetaminophen  1 tablet  Oral QID   And   oxyCODONE  5 mg Oral QID   pantoprazole (PROTONIX) IV  40 mg Intravenous Q12H   QUEtiapine  200 mg Oral QHS   sucralfate  1 g Oral TID WC & HS   traZODone  100 mg Oral QHS  Continuous Infusions:  sodium chloride 50 mL/hr at 01/04/21 0950   ampicillin-sulbactam (UNASYN) IV 3 g (01/04/21 0226)   azithromycin 500 mg (01/04/21 0410)   promethazine (PHENERGAN) injection (IM or IVPB) 12.5 mg (01/04/21 0955)    Labs: Recent Labs  Lab 01/03/21 0004 01/03/21 0755 01/04/21 0303  NA 140  --  148*  K 3.7  --  3.6  CL 114*  --  116*  CO2 17*  --  21*  BUN 23*  --  23*  CREATININE 0.75 0.86 0.86  CALCIUM 8.4*  --  8.8*  MG  --   --  2.3  PHOS  --   --  3.2  GLUCOSE 130*  --  133*   NUTRITION - FOCUSED PHYSICAL EXAM: Unable to provide at this time; however, suspect pt meets criteria for malnutrition. Will attempt at follow-up.   Diet Order:   Diet Order             Diet clear liquid Room service appropriate? Yes; Fluid consistency: Thin  Diet effective now                   EDUCATION NEEDS:   No education needs have been identified at this time  Skin:  Skin  Assessment: Reviewed RN Assessment  Last BM:  12/23  Height:   Ht Readings from Last 1 Encounters:  01/03/21 4' 11.5" (1.511 m)    Weight:   Wt Readings from Last 1 Encounters:  01/03/21 34.9 kg    BMI:  Body mass index is 15.29 kg/m.  Estimated Nutritional Needs:   Kcal:  1200-1400  Protein:  60-70 grams  Fluid:  >1.2L     Theone Stanley., MS, RD, LDN (she/her/hers) RD pager number and weekend/on-call pager number located in [place].

## 2021-01-04 NOTE — Progress Notes (Signed)
Patient refused to walk, many attempts made. C/o weakness, n/v, etc.

## 2021-01-05 DIAGNOSIS — K2100 Gastro-esophageal reflux disease with esophagitis, without bleeding: Secondary | ICD-10-CM

## 2021-01-05 DIAGNOSIS — D649 Anemia, unspecified: Secondary | ICD-10-CM

## 2021-01-05 LAB — BASIC METABOLIC PANEL
Anion gap: 6 (ref 5–15)
BUN: 13 mg/dL (ref 6–20)
CO2: 25 mmol/L (ref 22–32)
Calcium: 8.5 mg/dL — ABNORMAL LOW (ref 8.9–10.3)
Chloride: 111 mmol/L (ref 98–111)
Creatinine, Ser: 0.53 mg/dL (ref 0.44–1.00)
GFR, Estimated: >60 mL/min (ref >60)
Glucose, Bld: 122 mg/dL — ABNORMAL HIGH (ref 70–99)
Potassium: 3.3 mmol/L — ABNORMAL LOW (ref 3.5–5.1)
Sodium: 142 mmol/L (ref 135–145)

## 2021-01-05 MED ORDER — PANTOPRAZOLE SODIUM 40 MG PO TBEC: 40.0000 mg | DELAYED_RELEASE_TABLET | Freq: Two times a day (BID) | ORAL | 3 refills | Status: AC

## 2021-01-05 MED ORDER — ORAL CARE MOUTH RINSE
15.0000 mL | Freq: Two times a day (BID) | OROMUCOSAL | Status: DC
  Administered 2021-01-05: 09:00:00 15 mL via OROMUCOSAL

## 2021-01-05 MED ORDER — SUCRALFATE 1 GM/10ML PO SUSP: 1.0000 g | Freq: Three times a day (TID) | ORAL | 1 refills | Status: DC

## 2021-01-05 MED ORDER — AMOXICILLIN-POT CLAVULANATE 875-125 MG PO TABS: 1.0000 | ORAL_TABLET | Freq: Two times a day (BID) | ORAL | 0 refills | Status: DC

## 2021-01-05 MED ORDER — POTASSIUM CHLORIDE CRYS ER 20 MEQ PO TBCR
40.0000 meq | EXTENDED_RELEASE_TABLET | Freq: Once | ORAL | Status: AC
  Administered 2021-01-05: 08:00:00 40 meq via ORAL
  Filled 2021-01-05: qty 2

## 2021-01-05 MED ORDER — PREDNISONE 20 MG PO TABS
40.0000 mg | ORAL_TABLET | Freq: Every day | ORAL | Status: DC
  Administered 2021-01-05: 08:00:00 40 mg via ORAL
  Filled 2021-01-05: qty 2

## 2021-01-05 MED ORDER — ALUM & MAG HYDROXIDE-SIMETH 200-200-20 MG/5ML PO SUSP
30.0000 mL | Freq: Four times a day (QID) | ORAL | Status: DC | PRN
  Administered 2021-01-05: 09:00:00 30 mL via ORAL
  Filled 2021-01-05: qty 30

## 2021-01-05 MED ORDER — PREDNISONE 20 MG PO TABS: 40.0000 mg | ORAL_TABLET | Freq: Every day | ORAL | 0 refills | Status: DC

## 2021-01-05 NOTE — Plan of Care (Signed)
°  Problem: Education: Goal: Knowledge of General Education information will improve Description: Including pain rating scale, medication(s)/side effects and non-pharmacologic comfort measures 01/05/2021 1429 by Morene Rankins, LPN Outcome: Adequate for Discharge 01/05/2021 1428 by Morene Rankins, LPN Outcome: Adequate for Discharge 01/05/2021 1017 by Morene Rankins, LPN Outcome: Progressing   Problem: Health Behavior/Discharge Planning: Goal: Ability to manage health-related needs will improve 01/05/2021 1429 by Morene Rankins, LPN Outcome: Adequate for Discharge 01/05/2021 1428 by Morene Rankins, LPN Outcome: Adequate for Discharge 01/05/2021 1017 by Morene Rankins, LPN Outcome: Progressing   Problem: Clinical Measurements: Goal: Ability to maintain clinical measurements within normal limits will improve 01/05/2021 1429 by Morene Rankins, LPN Outcome: Adequate for Discharge 01/05/2021 1428 by Morene Rankins, LPN Outcome: Adequate for Discharge 01/05/2021 1017 by Morene Rankins, LPN Outcome: Progressing Goal: Will remain free from infection 01/05/2021 1429 by Morene Rankins, LPN Outcome: Adequate for Discharge 01/05/2021 1428 by Morene Rankins, LPN Outcome: Adequate for Discharge 01/05/2021 1017 by Morene Rankins, LPN Outcome: Progressing Goal: Diagnostic test results will improve 01/05/2021 1429 by Morene Rankins, LPN Outcome: Adequate for Discharge 01/05/2021 1428 by Morene Rankins, LPN Outcome: Adequate for Discharge 01/05/2021 1017 by Morene Rankins, LPN Outcome: Progressing   Problem: Inadequate Intake (NI-2.1) Goal: Food and/or nutrient delivery Description: Individualized approach for food/nutrient provision. Outcome: Adequate for Discharge

## 2021-01-05 NOTE — Discharge Summary (Signed)
PATIENT DETAILS Name: [name] Age: 50 y.o. Sex: female Date of Birth: 1970/02/14 MRN: [id number]. Admitting Physician: Rise Patience, MD [id number], Tamala Julian  Admit Date: 01/02/2021 Discharge date: 01/05/2021  Recommendations for Outpatient Follow-up:  Follow up with PCP in 1-2 weeks Please obtain CMP/CBC in one week Please ensure follow-up with pulmonology and gastroenterology.  Admitted From:  Home  Disposition: [place]: No  Equipment/Devices: Oxygen 2L  Discharge Condition: Stable  CODE STATUS: FULL CODE  Diet recommendation:  Diet Order             Diet - low sodium heart healthy           Diet clear liquid Room service appropriate? Yes; Fluid consistency: Thin  Diet effective now                    Brief Summary: Patient is a 50 y.o. female with history of peptic ulcer disease causing gastric outlet obstruction-s/p partial gastrectomy/bilateral vagotomy in 2018, severe postsurgical gastroparesis, chronic constipation, hypothyroidism, depression, malnutrition with G-tube in place-presented with shortness of breath-found to have acute hypoxic respiratory failure secondary to extensive bilateral pulmonary infiltrates  Brief Hospital Course: Acute hypoxic respiratory failure due to flare of underlying ILD due to chronic aspiration in the setting of esophageal motility/gastroparesis: Treated with IV antibiotics/steroids and other supportive measures.  Initially requiring up to 6 L of oxygen-now down to 2 L of oxygen.  Patient also reports clinical improvement and is requesting to go home today (christmas day).  Per nursing staff-qualifies for home O2-I have ordered.  Pulmonology was consulted during this hospital stay-recommendations are for short course of tapering prednisone and antibiotics.  She was on Unasyn-we will transition to Augmentin on discharge.  Echo with stable EF-not felt to have CHF exacerbation.  Please ensure patient  follows with her primary pulmonologist for further continued care.  Patient aware that she needs to continue lifestyle modification to minimize risk of aspiration and aware that if aspiration continues-she may be at risk for further worsening of underlying pulmonary issues.     History of peptic ulcer disease causing gastric outlet obstruction-s/p partial gastrectomy/vagotomy with resultant postoperative gastroparesis/esophageal dysmotility syndrome: Follows with Dr. Hilarie Fredrickson in the outpatient setting-given significant worsening of hypoxemia-due to underlying ILD from chronic aspiration-GI consulted-Per GI note-patient prefers further work-up including EGD to be done in the outpatient setting.  Continue PPI/Carafate.  Encourage small portion meals-continue to use antiemetics as needed.   Hypernatremia: Resolved with hydration.   Severe malnutrition: On J-tube feedings at home-we will obtain nutrition evaluation.  On Marinol chronically to stimulate appetite.   History of mood disorder: Continue BuSpar/Seroquel/trazodone  Obesity: Estimated body mass index is 15.29 kg/m as calculated from the following:   Height as of this encounter: 4' 11.5" (1.511 m).   Weight as of this encounter: 34.9 kg.    Procedures None  Discharge Diagnoses:  Principal Problem:   Acute respiratory failure with hypoxia (HCC) Active Problems:   Normochromic normocytic anemia   Hypoxia   Abdominal pain   Discharge Instructions:  Activity:  As tolerated  Discharge Instructions     Ambulatory referral to Gastroenterology   Complete by: As directed    Hospital follow-up visit.   Ambulatory referral to Pulmonology   Complete by: As directed    Hospital discharge follow-up visit.   Reason for referral: Interstitial Lung Disease(ILD)   Call MD for:  difficulty breathing, headache or visual disturbances   Complete  by: As directed    Diet - low sodium heart healthy   Complete by: As directed    Discharge  instructions   Complete by: As directed    Follow with Primary MD  Cyndi Bender, PA-C in 1-2 weeks  Please follow-up with your primary gastroenterologist and primary pulmonologist in the next 2 weeks.  Use home oxygen 24/7.  Please get a complete blood count and chemistry panel checked by your Primary MD at your next visit, and again as instructed by your Primary MD.  Get Medicines reviewed and adjusted: Please take all your medications with you for your next visit with your Primary MD  Laboratory/radiological data: Please request your Primary MD to go over all hospital tests and procedure/radiological results at the follow up, please ask your Primary MD to get all Hospital records sent to his/her office.  In some cases, they will be blood work, cultures and biopsy results pending at the time of your discharge. Please request that your primary care M.D. follows up on these results.  Also Note the following: If you experience worsening of your admission symptoms, develop shortness of breath, life threatening emergency, suicidal or homicidal thoughts you must seek medical attention immediately by calling 911 or calling your MD immediately  if symptoms less severe.  You must read complete instructions/literature along with all the possible adverse reactions/side effects for all the Medicines you take and that have been prescribed to you. Take any new Medicines after you have completely understood and accpet all the possible adverse reactions/side effects.   Do not drive when taking Pain medications or sleeping medications (Benzodaizepines)  Do not take more than prescribed Pain, Sleep and Anxiety Medications. It is not advisable to combine anxiety,sleep and pain medications without talking with your primary care practitioner  Special Instructions: If you have smoked or chewed Tobacco  in the last 2 yrs please stop smoking, stop any regular Alcohol  and or any Recreational drug use.  Wear  Seat belts while driving.  Please note: You were cared for by a hospitalist during your hospital stay. Once you are discharged, your primary care physician will handle any further medical issues. Please note that NO REFILLS for any discharge medications will be authorized once you are discharged, as it is imperative that you return to your primary care physician (or establish a relationship with a primary care physician if you do not have one) for your post hospital discharge needs so that they can reassess your need for medications and monitor your lab values.   Increase activity slowly   Complete by: As directed       Allergies as of 01/05/2021       Reactions   Morphine And Related    Sulfa Antibiotics         Medication List     TAKE these medications    albuterol 108 (90 Base) MCG/ACT inhaler Commonly known as: VENTOLIN HFA Inhale 1-2 puffs into the lungs 4 (four) times daily as needed for shortness of breath or wheezing.   amoxicillin-clavulanate 875-125 MG tablet Commonly known as: Augmentin Take 1 tablet by mouth 2 (two) times daily.   busPIRone 15 MG tablet Commonly known as: BUSPAR Take 15 mg by mouth 2 (two) times daily.   cyclobenzaprine 5 MG tablet Commonly known as: FLEXERIL Take 5 mg by mouth 3 (three) times daily as needed for muscle spasms.   dronabinol 2.5 MG capsule Commonly known as: MARINOL Take 2.5 mg by mouth  daily.   gabapentin 800 MG tablet Commonly known as: NEURONTIN Take 800 mg by mouth 3 (three) times daily.   hydrOXYzine 25 MG tablet Commonly known as: ATARAX Take 50 mg by mouth at bedtime.   naloxone 4 MG/0.1ML Liqd nasal spray kit Commonly known as: NARCAN Place 1 spray into the nose once as needed.   ondansetron 4 MG disintegrating tablet Commonly known as: ZOFRAN-ODT Take 4 mg by mouth every 6 (six) hours as needed for nausea or vomiting.   OSMOLITE 1.5 CAL PO Give 2.5 Cans by tube at bedtime. Pt states that she infuses  this via an infusion pump over 8-10 hours   oxyCODONE-acetaminophen 7.5-325 MG tablet Commonly known as: PERCOCET Take 1 tablet by mouth 4 (four) times daily.   pantoprazole 40 MG tablet Commonly known as: Protonix Take 1 tablet (40 mg total) by mouth 2 (two) times daily.   predniSONE 20 MG tablet Commonly known as: DELTASONE Take 2 tablets (40 mg total) by mouth daily with breakfast. Start taking on: January 06, 2021   QUEtiapine 200 MG tablet Commonly known as: SEROQUEL Take 200 mg by mouth at bedtime.   sucralfate 1 GM/10ML suspension Commonly known as: CARAFATE Take 10 mLs (1 g total) by mouth 4 (four) times daily -  with meals and at bedtime.   traZODone 50 MG tablet Commonly known as: DESYREL Take 100 mg by mouth at bedtime.               Durable Medical Equipment  (From admission, onward)           Start     Ordered   01/05/21 1141  For home use only DME oxygen  Once       Question Answer Comment  Length of Need 6 Months   Mode or (Route) Nasal cannula   Liters per Minute 2   Frequency Continuous (stationary and portable oxygen unit needed)   Oxygen conserving device Yes   Oxygen delivery system Gas      01/05/21 1140            Follow-up Information     Cyndi Bender, PA-C. Schedule an appointment as soon as possible for a visit in 1 week(s).   Specialty: Physician Assistant Contact information: [place] [address] [phone]         Jerene Bears, MD. Schedule an appointment as soon as possible for a visit in 2 day(s).   Specialty: Gastroenterology Contact information: [address] [place] [address] [phone]         Julian Hy, DO Follow up in 2 week(s).   Specialty: Pulmonary Disease Contact information: [address] [place] [address] [phone]                Allergies  Allergen Reactions   Morphine And Related    Sulfa Antibiotics        Consultations:  pulmonary/intensive care and GI   Other Procedures/Studies: DG Abd 1 View  Result Date: 01/03/2021 CLINICAL DATA:  Abdominal pain. EXAM: ABDOMEN - 1 VIEW COMPARISON:  None. FINDINGS: No gaseous bowel dilatation to suggest obstruction. Apparent left-sided surgical feeding catheter. Sacral stimulator device noted. Contrast material in the bladder compatible with CT chest earlier today. IMPRESSION: No evidence of bowel obstruction. Electronically Signed   By: Misty Stanley M.D.   On: 01/03/2021 08:05   CT Angio Chest PE W and/or Wo Contrast  Result Date: 01/03/2021  CLINICAL DATA:  Pulmonary embolism suspected, high probability. Cough, dyspnea, shortness of breath. EXAM: CT ANGIOGRAPHY CHEST WITH CONTRAST TECHNIQUE: Multidetector CT imaging of the chest was performed using the standard protocol during bolus administration of intravenous contrast. Multiplanar CT image reconstructions and MIPs were obtained to evaluate the vascular anatomy. CONTRAST:  33m OMNIPAQUE IOHEXOL 350 MG/ML SOLN COMPARISON:  07/07/2019. FINDINGS: Cardiovascular: The heart is normal in size and there is no pericardial effusion. The aorta and pulmonary trunk are normal in caliber. No pulmonary artery filling defect is identified. Mediastinum/Nodes: Prominent lymph nodes are present in the subcarinal space and right hilum measuring up to 1 cm in short axis diameter. No left hilar adenopathy. No axillary lymphadenopathy bilaterally. Thyroid gland and trachea are within normal limits. There is thickening of the walls of the mid to distal esophagus. Lungs/Pleura: Fibrotic changes are noted in the lungs bilaterally. There are hazy ground-glass airspace opacities bilaterally, greater on the right than on the left. No effusion or pneumothorax. Upper Abdomen: Surgical changes are present in the epigastric region. There is a small amount of perihepatic free fluid. Musculoskeletal: Old healed rib fractures are noted on  the right. Compression fractures are noted at T7, T8, T11, and T12. The fracture at T7 is unchanged from the previous exam. Review of the MIP images confirms the above findings. IMPRESSION: 1. No evidence of pulmonary embolism. 2. Extensive ground-glass opacities in the lungs bilaterally, possible edema or infiltrate. 3. Marked thickening of the walls of the mid to distal esophagus. Endoscopy is recommended for further evaluation. 4. Stable compression deformity at T7. There are compression deformities in the thoracic spine at T8, T11, and T12, indeterminate in age. 5. Small amount of free fluid in the left upper quadrant. Electronically Signed   By: LBrett FairyM.D.   On: 01/03/2021 02:41   DG Chest Port 1 View  Result Date: 01/02/2021 CLINICAL DATA:  Cough, dyspnea EXAM: PORTABLE CHEST 1 VIEW COMPARISON:  09/11/2019 FINDINGS: Lung volumes are small, however, pulmonary insufflation is symmetric and stable since prior examination. Superimposed predominantly mid and lower lung zone interstitial and airspace infiltrate has significantly progressed in the interval since prior examination, likely inflammatory in nature. Noncardiogenic pulmonary edema is considered less likely. No pneumothorax or pleural effusion. Cardiac size within normal limits. Pulmonary vascularity is normal. IMPRESSION: Marked interval progression of mid and lower lung zone predominant pulmonary infiltrate, favored to be inflammatory. This would be better assessed with nonemergent high-resolution CT examination of the chest, if clinically indicated. Electronically Signed   By: AFidela SalisburyM.D.   On: 01/02/2021 23:48   ECHOCARDIOGRAM COMPLETE  Result Date: 01/03/2021    ECHOCARDIOGRAM REPORT   Patient Name:   RJOVANNI ECKHARTDate of Exam: 01/03/2021 Medical Rec #:  [id number]   Height: Accession #:    [id number]  Weight: Date of Birth:  [birth year]    BSA: Patient Age:    89years     BP:           120/79 mmHg Patient Gender: F             HR:           81 bpm. Exam Location:  Inpatient Procedure: 2D Echo, Cardiac Doppler and Color Doppler Indications:    Dyspnea  History:        Patient has no prior history of Echocardiogram examinations.  Sonographer:    BGlo HerringReferring Phys: [id number] 1. Left  ventricular ejection fraction, by estimation, is 60 to 65%. The left ventricle has normal function. The left ventricle has no regional wall motion abnormalities. Left ventricular diastolic parameters were normal.  2. Right ventricular systolic function is normal. The right ventricular size is normal.  3. The mitral valve is myxomatous. Mild to moderate mitral valve regurgitation. No evidence of mitral stenosis. There is mild late systolic prolapse of both leaflets of the mitral valve.  4. The tricuspid valve is myxomatous. Tricuspid valve regurgitation is mild to moderate.  5. The aortic valve is normal in structure. Aortic valve regurgitation is not visualized. No aortic stenosis is present.  6. The inferior vena cava is normal in size with greater than 50% respiratory variability, suggesting right atrial pressure of 3 mmHg. FINDINGS  Left Ventricle: Left ventricular ejection fraction, by estimation, is 60 to 65%. The left ventricle has normal function. The left ventricle has no regional wall motion abnormalities. The left ventricular internal cavity size was normal in size. There is  no left ventricular hypertrophy. Left ventricular diastolic parameters were normal. Normal left ventricular filling pressure. Right Ventricle: The right ventricular size is normal. No increase in right ventricular wall thickness. Right ventricular systolic function is normal. Left Atrium: Left atrial size was normal in size. Right Atrium: Right atrial size was normal in size. Pericardium: There is no evidence of pericardial effusion. Mitral Valve: The mitral valve is myxomatous. There is mild late systolic prolapse of both leaflets of the  mitral valve. There is mild thickening of the mitral valve leaflet(s). Mild to moderate mitral valve regurgitation, with centrally-directed jet. No  evidence of mitral valve stenosis. Tricuspid Valve: The tricuspid valve is myxomatous. Tricuspid valve regurgitation is mild to moderate. No evidence of tricuspid stenosis. Aortic Valve: The aortic valve is normal in structure. Aortic valve regurgitation is not visualized. No aortic stenosis is present. Aortic valve mean gradient measures 3.0 mmHg. Aortic valve peak gradient measures 6.8 mmHg. Aortic valve area, by VTI measures 1.85 cm. Pulmonic Valve: The pulmonic valve was normal in structure. Pulmonic valve regurgitation is not visualized. No evidence of pulmonic stenosis. Aorta: The aortic root is normal in size and structure. Venous: The inferior vena cava is normal in size with greater than 50% respiratory variability, suggesting right atrial pressure of 3 mmHg. IAS/Shunts: No atrial level shunt detected by color flow Doppler.  LEFT VENTRICLE PLAX 2D LVIDd:         4.10 cm     Diastology LVIDs:         2.70 cm     LV e' medial:    10.10 cm/s LV PW:         0.80 cm     LV E/e' medial:  8.4 LV IVS:        0.80 cm     LV e' lateral:   11.90 cm/s LVOT diam:     1.70 cm     LV E/e' lateral: 7.2 LV SV:         52 LVOT Area:     2.27 cm  LV Volumes (MOD) LV vol d, MOD A2C: 42.5 ml LV vol d, MOD A4C: 69.4 ml LV vol s, MOD A2C: 16.2 ml LV vol s, MOD A4C: 27.5 ml LV SV MOD A2C:     26.3 ml LV SV MOD A4C:     69.4 ml LV SV MOD BP:      34.8 ml RIGHT VENTRICLE  IVC RV Basal diam:  3.30 cm     IVC diam: 1.90 cm RV Mid diam:    1.60 cm RV S prime:     10.90 cm/s LEFT ATRIUM             RIGHT ATRIUM LA diam:        3.70 cm RA Area:     13.30 cm LA Vol (A2C):   30.1 ml RA Volume:   29.80 ml LA Vol (A4C):   38.3 ml LA Biplane Vol: 37.2 ml  AORTIC VALVE                    PULMONIC VALVE AV Area (Vmax):    1.85 cm     PV Vmax:       0.83 m/s AV Area (Vmean):   1.62  cm     PV Peak grad:  2.8 mmHg AV Area (VTI):     1.85 cm AV Vmax:           130.00 cm/s AV Vmean:          84.800 cm/s AV VTI:            0.279 m AV Peak Grad:      6.8 mmHg AV Mean Grad:      3.0 mmHg LVOT Vmax:         106.00 cm/s LVOT Vmean:        60.700 cm/s LVOT VTI:          0.227 m LVOT/AV VTI ratio: 0.81  AORTA Ao Root diam: 2.90 cm Ao Asc diam:  2.70 cm MITRAL VALVE MV Area (PHT): 5.58 cm    SHUNTS MV Decel Time: 136 msec    Systemic VTI:  0.23 m MV E velocity: 85.30 cm/s  Systemic Diam: 1.70 cm MV A velocity: 71.60 cm/s MV E/A ratio:  1.19 Mihai Croitoru MD Electronically signed by Sanda Klein MD Signature Date/Time: 01/03/2021/1:16:23 PM    Final      TODAY-DAY OF DISCHARGE:  Subjective:   Kanesha Cadle today has no headache,no chest abdominal pain,no new weakness tingling or numbness, feels much better wants to go home today.  Objective:   Blood pressure (!) 127/94, pulse 93, temperature 98.1 F (36.7 C), temperature source Oral, resp. rate 15, height 4' 11.5" (1.511 m), weight 34.9 kg, SpO2 98 %.  Intake/Output Summary (Last 24 hours) at 01/05/2021 1210 Last data filed at 01/05/2021 0400 Gross per 24 hour  Intake 572.18 ml  Output 0 ml  Net 572.18 ml   Filed Weights   01/03/21 0911  Weight: 34.9 kg    Exam: Awake Alert, Oriented *3, No new F.N deficits, Normal affect [place].AT,PERRAL Supple Neck,No JVD, No cervical lymphadenopathy appriciated.  Symmetrical Chest wall movement, Good air movement bilaterally, CTAB RRR,No Gallops,Rubs or new Murmurs, No Parasternal Heave +ve B.Sounds, Abd Soft, Non tender, No organomegaly appriciated, No rebound -guarding or rigidity. No Cyanosis, Clubbing or edema, No new Rash or bruise   PERTINENT RADIOLOGIC STUDIES: No results found.   PERTINENT LAB RESULTS: CBC: Recent Labs    01/03/21 0755 01/04/21 0303  WBC 11.4* 13.4*  HGB 11.5* 11.2*  HCT 36.8 36.5  PLT 276 329   CMET CMP     Component Value Date/Time   NA  142 01/05/2021 0446   K 3.3 (L) 01/05/2021 0446   CL 111 01/05/2021 0446   CO2 25 01/05/2021 0446   GLUCOSE 122 (H) 01/05/2021 0446   BUN  13 01/05/2021 0446   CREATININE 0.53 01/05/2021 0446   CALCIUM 8.5 (L) 01/05/2021 0446   PROT 5.6 (L) 01/03/2021 0004   ALBUMIN 2.7 (L) 01/03/2021 0004   AST 21 01/03/2021 0004   ALT 19 01/03/2021 0004   ALKPHOS 103 01/03/2021 0004   BILITOT 0.7 01/03/2021 0004   GFRNONAA >60 01/05/2021 0446    GFR Estimated Creatinine Clearance: 47.4 mL/min (by C-G formula based on SCr of 0.53 mg/dL). No results for input(s): LIPASE, AMYLASE in the last 72 hours. No results for input(s): CKTOTAL, CKMB, CKMBINDEX, TROPONINI in the last 72 hours. Invalid input(s): POCBNP No results for input(s): DDIMER in the last 72 hours. No results for input(s): HGBA1C in the last 72 hours. No results for input(s): CHOL, HDL, LDLCALC, TRIG, CHOLHDL, LDLDIRECT in the last 72 hours. No results for input(s): TSH, T4TOTAL, T3FREE, THYROIDAB in the last 72 hours.  Invalid input(s): FREET3 No results for input(s): VITAMINB12, FOLATE, FERRITIN, TIBC, IRON, RETICCTPCT in the last 72 hours. Coags: No results for input(s): INR in the last 72 hours.  Invalid input(s): PT Microbiology: Recent Results (from the past 240 hour(s))  Resp Panel by RT-PCR (Flu A&B, Covid) Nasopharyngeal Swab     Status: None   Collection Time: 01/03/21 12:06 AM   Specimen: Nasopharyngeal Swab; Nasopharyngeal(NP) swabs in vial transport medium  Result Value Ref Range Status   SARS Coronavirus 2 by RT PCR NEGATIVE NEGATIVE Final    Comment: (NOTE) SARS-CoV-2 target nucleic acids are NOT DETECTED.  The SARS-CoV-2 RNA is generally detectable in upper respiratory specimens during the acute phase of infection. The lowest concentration of SARS-CoV-2 viral copies this assay can detect is 138 copies/mL. A negative result does not preclude SARS-Cov-2 infection and should not be used as the sole basis for  treatment or other patient management decisions. A negative result may occur with  improper specimen collection/handling, submission of specimen other than nasopharyngeal swab, presence of viral mutation(s) within the areas targeted by this assay, and inadequate number of viral copies(<138 copies/mL). A negative result must be combined with clinical observations, patient history, and epidemiological information. The expected result is Negative.  Fact Sheet for Patients:  EntrepreneurPulse.com.au  Fact Sheet for Healthcare Providers:  IncredibleEmployment.be  This test is no t yet approved or cleared by the [place] FDA and  has been authorized for detection and/or diagnosis of SARS-CoV-2 by FDA under an Emergency Use Authorization (EUA). This EUA will remain  in effect (meaning this test can be used) for the duration of the COVID-19 declaration under Section 564(b)(1) of the Act, 21 U.S.C.section 360bbb-3(b)(1), unless the authorization is terminated  or revoked sooner.       Influenza A by PCR NEGATIVE NEGATIVE Final   Influenza B by PCR NEGATIVE NEGATIVE Final    Comment: (NOTE) The Xpert Xpress SARS-CoV-2/FLU/RSV plus assay is intended as an aid in the diagnosis of influenza from Nasopharyngeal swab specimens and should not be used as a sole basis for treatment. Nasal washings and aspirates are unacceptable for Xpert Xpress SARS-CoV-2/FLU/RSV testing.  Fact Sheet for Patients: EntrepreneurPulse.com.au  Fact Sheet for Healthcare Providers: IncredibleEmployment.be  This test is not yet approved or cleared by the [place] FDA and has been authorized for detection and/or diagnosis of SARS-CoV-2 by FDA under an Emergency Use Authorization (EUA). This EUA will remain in effect (meaning this test can be used) for the duration of the COVID-19 declaration under Section 564(b)(1) of the Act, 21  U.S.C. section 360bbb-3(b)(1), unless  the authorization is terminated or revoked.  Performed at [facility], [place] [address] [place], [place] [address]   Respiratory (~20 pathogens) panel by PCR     Status: None   Collection Time: 01/03/21 12:06 AM   Specimen: Nasopharyngeal Swab; Respiratory  Result Value Ref Range Status   Adenovirus NOT DETECTED NOT DETECTED Final   Coronavirus 229E NOT DETECTED NOT DETECTED Final    Comment: (NOTE) The Coronavirus on the Respiratory Panel, DOES NOT test for the novel  Coronavirus (2019 nCoV)    Coronavirus HKU1 NOT DETECTED NOT DETECTED Final   Coronavirus NL63 NOT DETECTED NOT DETECTED Final   Coronavirus OC43 NOT DETECTED NOT DETECTED Final   Metapneumovirus NOT DETECTED NOT DETECTED Final   Rhinovirus / Enterovirus NOT DETECTED NOT DETECTED Final   Influenza A NOT DETECTED NOT DETECTED Final   Influenza B NOT DETECTED NOT DETECTED Final   Parainfluenza Virus 1 NOT DETECTED NOT DETECTED Final   Parainfluenza Virus 2 NOT DETECTED NOT DETECTED Final   Parainfluenza Virus 3 NOT DETECTED NOT DETECTED Final   Parainfluenza Virus 4 NOT DETECTED NOT DETECTED Final   Respiratory Syncytial Virus NOT DETECTED NOT DETECTED Final   Bordetella pertussis NOT DETECTED NOT DETECTED Final   Bordetella Parapertussis NOT DETECTED NOT DETECTED Final   Chlamydophila pneumoniae NOT DETECTED NOT DETECTED Final   Mycoplasma pneumoniae NOT DETECTED NOT DETECTED Final    Comment: Performed at [facility], [place]. [address] [place], [place] [address]    FURTHER DISCHARGE INSTRUCTIONS:  Get Medicines reviewed and adjusted: Please take all your medications with you for your next visit with your Primary MD  Laboratory/radiological data: Please request your Primary MD to go over all hospital tests and procedure/radiological results at the follow up, please ask your Primary MD to get all Hospital records sent to his/her office.  In some cases, they will  be blood work, cultures and biopsy results pending at the time of your discharge. Please request that your primary care M.D. goes through all the records of your hospital data and follows up on these results.  Also Note the following: If you experience worsening of your admission symptoms, develop shortness of breath, life threatening emergency, suicidal or homicidal thoughts you must seek medical attention immediately by calling 911 or calling your MD immediately  if symptoms less severe.  You must read complete instructions/literature along with all the possible adverse reactions/side effects for all the Medicines you take and that have been prescribed to you. Take any new Medicines after you have completely understood and accpet all the possible adverse reactions/side effects.   Do not drive when taking Pain medications or sleeping medications (Benzodaizepines)  Do not take more than prescribed Pain, Sleep and Anxiety Medications. It is not advisable to combine anxiety,sleep and pain medications without talking with your primary care practitioner  Special Instructions: If you have smoked or chewed Tobacco  in the last 2 yrs please stop smoking, stop any regular Alcohol  and or any Recreational drug use.  Wear Seat belts while driving.  Please note: You were cared for by a hospitalist during your hospital stay. Once you are discharged, your primary care physician will handle any further medical issues. Please note that NO REFILLS for any discharge medications will be authorized once you are discharged, as it is imperative that you return to your primary care physician (or establish a relationship with a primary care physician if you do not have one) for  your post hospital discharge needs so that they can reassess your need for medications and monitor your lab values.  Total Time spent coordinating discharge including counseling, education and face to face time equals 35  minutes.  SignedOren Binet 01/05/2021 12:10 PM

## 2021-01-05 NOTE — TOC Transition Note (Signed)
Transition of Care [facility]) - CM/SW Discharge Note   Patient Details  Name: [name] MRN: [id number] Date of Birth: [birth year]  Transition of Care [facility]) CM/SW Contact:  Bartholomew Crews, RN Phone Number: [phone] 01/05/2021, 12:16 PM   Clinical Narrative:     Spoke with patient at the bedside to discuss post acute transition planning. Patient needing home oxygen. Referral to [employer] for new home oxygen set up. No further TOC needs identified.   Final next level of care: Home/Self Care Barriers to Discharge: No Barriers Identified   Patient Goals and CMS Choice Patient states their goals for this hospitalization and ongoing recovery are:: return home CMS Medicare.gov Compare Post Acute Care list provided to:: Patient Choice offered to / list presented to : Patient  Discharge Placement                       Discharge Plan and Services                DME Arranged: Oxygen DME Agency: [employer] Date DME Agency Contacted: 01/05/21 Time DME Agency Contacted: 1215 Representative spoke with at DME Agency: [place]: NA [place] Agency: NA        Social Determinants of Health ([place]) Interventions     Readmission Risk Interventions No flowsheet data found.

## 2021-01-05 NOTE — Progress Notes (Signed)
Pt refused free water via PEG tube, stating"my tube does not work like it is supposed. It leaks, and I do not want anything in tube tonight." RN explain importance of flashing tube, pt still continues to refuse.

## 2021-01-05 NOTE — Social Work (Signed)
°  Transition of Care [facility]) Screening Note   Patient Details  Name: [name] Date of Birth: 04-Jul-1970   Transition of Care [facility]) CM/SW Contact:    Coralee Pesa, [place] Phone Number: 01/05/2021, 10:38 AM    Transition of Care Department [facility]) has reviewed patient and no TOC needs have been identified at this time. We will continue to monitor patient advancement through interdisciplinary progression rounds. If new patient transition needs arise, please place a TOC consult.

## 2021-01-05 NOTE — Progress Notes (Addendum)
SATURATION QUALIFICATIONS: (This note is used to comply with regulatory documentation for home oxygen)  Patient Saturations on Room Air at Rest = 93%  Patient Saturations on Room Air while Ambulating = 88%  Patient Saturations on 2 Liters of oxygen while Ambulating = 94%  Please briefly explain why patient needs home oxygen:Pt oxygen saturation dropped on room air  SPO2< 90 and will need 2L O2 via [place] to maintain SPO2 >90.

## 2021-01-05 NOTE — Plan of Care (Signed)

## 2021-01-07 ENCOUNTER — Telehealth: Payer: Self-pay | Admitting: Internal Medicine

## 2021-01-07 ENCOUNTER — Encounter: Payer: Self-pay | Admitting: Hematology

## 2021-01-07 NOTE — Telephone Encounter (Signed)
Patient called and stated that she was in the ED on 12/23. Requesting a urgent F\U appointment with Dr. Hilarie Fredrickson. Please advise.

## 2021-01-07 NOTE — Telephone Encounter (Signed)
Patient has been scheduled for post hospital with [name] RNP on 01/14/21 at 1:30, she is notified of the appointment

## 2021-01-08 ENCOUNTER — Other Ambulatory Visit: Payer: Self-pay | Admitting: Internal Medicine

## 2021-01-08 ENCOUNTER — Other Ambulatory Visit (HOSPITAL_COMMUNITY): Payer: Self-pay | Admitting: Interventional Radiology

## 2021-01-08 DIAGNOSIS — K9423 Gastrostomy malfunction: Secondary | ICD-10-CM

## 2021-01-08 LAB — LEGIONELLA PNEUMOPHILA SEROGP 1 UR AG: L. pneumophila Serogp 1 Ur Ag: NEGATIVE

## 2021-01-09 ENCOUNTER — Ambulatory Visit (HOSPITAL_COMMUNITY)
Admission: RE | Admit: 2021-01-09 | Discharge: 2021-01-09 | Disposition: A | Discharge status: Home / Self Care | Payer: Medicare Other | Source: Ambulatory Visit | Attending: Interventional Radiology | Admitting: Interventional Radiology

## 2021-01-09 ENCOUNTER — Other Ambulatory Visit (HOSPITAL_COMMUNITY): Payer: Self-pay | Admitting: Interventional Radiology

## 2021-01-09 ENCOUNTER — Other Ambulatory Visit: Payer: Self-pay

## 2021-01-09 ENCOUNTER — Other Ambulatory Visit (HOSPITAL_COMMUNITY): Payer: Self-pay | Admitting: Physician Assistant

## 2021-01-09 ENCOUNTER — Encounter (HOSPITAL_COMMUNITY): Payer: Self-pay

## 2021-01-09 DIAGNOSIS — K9423 Gastrostomy malfunction: Secondary | ICD-10-CM | POA: Insufficient documentation

## 2021-01-09 MED ORDER — LIDOCAINE VISCOUS HCL 2 % MT SOLN
OROMUCOSAL | Status: AC
  Filled 2021-01-09: qty 15

## 2021-01-09 MED ORDER — IOHEXOL 300 MG/ML  SOLN
15.0000 mL | Freq: Once | INTRAMUSCULAR | Status: AC | PRN
  Administered 2021-01-09: 14:00:00 10 mL

## 2021-01-14 ENCOUNTER — Encounter: Payer: Self-pay | Admitting: Nurse Practitioner

## 2021-01-14 ENCOUNTER — Emergency Department (HOSPITAL_COMMUNITY): Payer: Medicare Other

## 2021-01-14 ENCOUNTER — Other Ambulatory Visit: Payer: Self-pay

## 2021-01-14 ENCOUNTER — Ambulatory Visit: Payer: Medicare Other | Admitting: Nurse Practitioner

## 2021-01-14 ENCOUNTER — Observation Stay (HOSPITAL_COMMUNITY)
Admission: EM | Admit: 2021-01-14 | Discharge: 2021-01-15 | Disposition: A | Discharge status: Home / Self Care | Payer: Medicare Other | Attending: Family Medicine | Admitting: Family Medicine

## 2021-01-14 ENCOUNTER — Encounter (HOSPITAL_COMMUNITY): Payer: Self-pay | Admitting: *Deleted

## 2021-01-14 ENCOUNTER — Ambulatory Visit: Payer: Medicare Other | Admitting: Pulmonary Disease

## 2021-01-14 DIAGNOSIS — G9340 Encephalopathy, unspecified: Secondary | ICD-10-CM | POA: Diagnosis not present

## 2021-01-14 DIAGNOSIS — R4182 Altered mental status, unspecified: Secondary | ICD-10-CM

## 2021-01-14 DIAGNOSIS — E43 Unspecified severe protein-calorie malnutrition: Secondary | ICD-10-CM | POA: Diagnosis present

## 2021-01-14 DIAGNOSIS — J449 Chronic obstructive pulmonary disease, unspecified: Secondary | ICD-10-CM | POA: Diagnosis not present

## 2021-01-14 DIAGNOSIS — K219 Gastro-esophageal reflux disease without esophagitis: Secondary | ICD-10-CM | POA: Diagnosis present

## 2021-01-14 DIAGNOSIS — E039 Hypothyroidism, unspecified: Secondary | ICD-10-CM | POA: Diagnosis not present

## 2021-01-14 DIAGNOSIS — F32A Depression, unspecified: Secondary | ICD-10-CM | POA: Diagnosis present

## 2021-01-14 DIAGNOSIS — D509 Iron deficiency anemia, unspecified: Secondary | ICD-10-CM | POA: Diagnosis present

## 2021-01-14 DIAGNOSIS — R933 Abnormal findings on diagnostic imaging of other parts of digestive tract: Secondary | ICD-10-CM

## 2021-01-14 DIAGNOSIS — F17210 Nicotine dependence, cigarettes, uncomplicated: Secondary | ICD-10-CM | POA: Insufficient documentation

## 2021-01-14 HISTORY — DX: Encephalopathy, unspecified: G93.40

## 2021-01-14 LAB — URINALYSIS, ROUTINE W REFLEX MICROSCOPIC
Bacteria, UA: NONE SEEN
Bilirubin Urine: NEGATIVE
Glucose, UA: NEGATIVE mg/dL
Hgb urine dipstick: NEGATIVE
Ketones, ur: NEGATIVE mg/dL
Leukocytes,Ua: NEGATIVE
Nitrite: NEGATIVE
Protein, ur: NEGATIVE mg/dL
Specific Gravity, Urine: 1.002 — ABNORMAL LOW (ref 1.005–1.030)
pH: 7 (ref 5.0–8.0)

## 2021-01-14 LAB — RAPID URINE DRUG SCREEN, HOSP PERFORMED
Amphetamines: NOT DETECTED
Barbiturates: NOT DETECTED
Benzodiazepines: NOT DETECTED
Cocaine: NOT DETECTED
Opiates: NOT DETECTED
Tetrahydrocannabinol: POSITIVE — AB

## 2021-01-14 LAB — CBC WITH DIFFERENTIAL/PLATELET
Abs Immature Granulocytes: 0.17 10*3/uL — ABNORMAL HIGH (ref 0.00–0.07)
Basophils Absolute: 0 10*3/uL (ref 0.0–0.1)
Basophils Relative: 0 %
Eosinophils Absolute: 0 10*3/uL (ref 0.0–0.5)
Eosinophils Relative: 0 %
HCT: 38 % (ref 36.0–46.0)
Hemoglobin: 11.9 g/dL — ABNORMAL LOW (ref 12.0–15.0)
Immature Granulocytes: 2 %
Lymphocytes Relative: 16 %
Lymphs Abs: 1.5 10*3/uL (ref 0.7–4.0)
MCH: 26.9 pg (ref 26.0–34.0)
MCHC: 31.3 g/dL (ref 30.0–36.0)
MCV: 86 fL (ref 80.0–100.0)
Monocytes Absolute: 0.7 10*3/uL (ref 0.1–1.0)
Monocytes Relative: 8 %
Neutro Abs: 7 10*3/uL (ref 1.7–7.7)
Neutrophils Relative %: 74 %
Platelets: 471 10*3/uL — ABNORMAL HIGH (ref 150–400)
RBC: 4.42 MIL/uL (ref 3.87–5.11)
RDW: 17.2 % — ABNORMAL HIGH (ref 11.5–15.5)
WBC: 9.4 10*3/uL (ref 4.0–10.5)
nRBC: 0 % (ref 0.0–0.2)

## 2021-01-14 LAB — COMPREHENSIVE METABOLIC PANEL
ALT: 15 U/L (ref 0–44)
AST: 16 U/L (ref 15–41)
Albumin: 3.4 g/dL — ABNORMAL LOW (ref 3.5–5.0)
Alkaline Phosphatase: 79 U/L (ref 38–126)
Anion gap: 9 (ref 5–15)
BUN: 9 mg/dL (ref 6–20)
CO2: 22 mmol/L (ref 22–32)
Calcium: 8.9 mg/dL (ref 8.9–10.3)
Chloride: 108 mmol/L (ref 98–111)
Creatinine, Ser: 0.59 mg/dL (ref 0.44–1.00)
GFR, Estimated: >60 mL/min (ref >60)
Glucose, Bld: 120 mg/dL — ABNORMAL HIGH (ref 70–99)
Potassium: 3.2 mmol/L — ABNORMAL LOW (ref 3.5–5.1)
Sodium: 139 mmol/L (ref 135–145)
Total Bilirubin: 0.6 mg/dL (ref 0.3–1.2)
Total Protein: 6.8 g/dL (ref 6.5–8.1)

## 2021-01-14 LAB — CBG MONITORING, ED: Glucose-Capillary: 123 mg/dL — ABNORMAL HIGH (ref 70–99)

## 2021-01-14 LAB — ETHANOL: Alcohol, Ethyl (B): <10 mg/dL (ref <10)

## 2021-01-14 MED ORDER — DRONABINOL 2.5 MG PO CAPS
2.5000 mg | ORAL_CAPSULE | Freq: Every day | ORAL | Status: DC
  Administered 2021-01-15: 2.5 mg via ORAL
  Filled 2021-01-14: qty 1

## 2021-01-14 MED ORDER — PANTOPRAZOLE SODIUM 40 MG PO TBEC
40.0000 mg | DELAYED_RELEASE_TABLET | Freq: Two times a day (BID) | ORAL | Status: DC
Start: 2021-01-14 — End: 2021-01-14

## 2021-01-14 MED ORDER — NALOXONE HCL 0.4 MG/ML IJ SOLN
0.4000 mg | Freq: Once | INTRAMUSCULAR | Status: DC
  Filled 2021-01-14: qty 1

## 2021-01-14 MED ORDER — POLYVINYL ALCOHOL 1.4 % OP SOLN
1.0000 [drp] | OPHTHALMIC | Status: DC | PRN
  Filled 2021-01-14: qty 15

## 2021-01-14 MED ORDER — OXYCODONE-ACETAMINOPHEN 7.5-325 MG PO TABS
1.0000 | ORAL_TABLET | Freq: Four times a day (QID) | ORAL | Status: DC
  Administered 2021-01-14 – 2021-01-15 (×3): 1 via ORAL
  Filled 2021-01-14 (×3): qty 1

## 2021-01-14 MED ORDER — PROMETHAZINE HCL 25 MG PO TABS: 25.0000 mg | ORAL_TABLET | Freq: Three times a day (TID) | ORAL | Status: DC | PRN

## 2021-01-14 MED ORDER — OSMOLITE 1.2 CAL PO LIQD
237.0000 mL | Freq: Three times a day (TID) | ORAL | Status: DC
  Filled 2021-01-14: qty 237

## 2021-01-14 MED ORDER — BUSPIRONE HCL 5 MG PO TABS
15.0000 mg | ORAL_TABLET | Freq: Two times a day (BID) | ORAL | Status: DC
  Administered 2021-01-14 – 2021-01-15 (×2): 15 mg via ORAL
  Filled 2021-01-14 (×2): qty 2

## 2021-01-14 MED ORDER — HYDROXYZINE HCL 25 MG PO TABS: 25.0000 mg | ORAL_TABLET | Freq: Every evening | ORAL | Status: DC | PRN

## 2021-01-14 MED ORDER — OSMOLITE 1.5 CAL PO LIQD
600.0000 mL | Freq: Every day | ORAL | Status: DC
  Filled 2021-01-14 (×2): qty 711

## 2021-01-14 MED ORDER — PANTOPRAZOLE SODIUM 40 MG PO TBEC
40.0000 mg | DELAYED_RELEASE_TABLET | Freq: Two times a day (BID) | ORAL | Status: DC
  Administered 2021-01-14 – 2021-01-15 (×2): 40 mg via ORAL
  Filled 2021-01-14 (×2): qty 1

## 2021-01-14 MED ORDER — ALBUTEROL SULFATE HFA 108 (90 BASE) MCG/ACT IN AERS: 1.0000 | INHALATION_SPRAY | Freq: Four times a day (QID) | RESPIRATORY_TRACT | Status: DC | PRN

## 2021-01-14 MED ORDER — OSMOLITE 1.2 CAL PO LIQD
237.0000 mL | Freq: Three times a day (TID) | ORAL | Status: DC
  Administered 2021-01-14 – 2021-01-15 (×2): 237 mL
  Filled 2021-01-14 (×4): qty 237

## 2021-01-14 MED ORDER — LORAZEPAM 2 MG/ML IJ SOLN
0.5000 mg | Freq: Once | INTRAMUSCULAR | Status: AC
  Administered 2021-01-14: 0.5 mg via INTRAVENOUS
  Filled 2021-01-14: qty 1

## 2021-01-14 MED ORDER — SUCRALFATE 1 GM/10ML PO SUSP
1.0000 g | Freq: Three times a day (TID) | ORAL | Status: DC
  Administered 2021-01-14 – 2021-01-15 (×3): 1 g via ORAL
  Filled 2021-01-14 (×3): qty 10

## 2021-01-14 MED ORDER — GABAPENTIN 400 MG PO CAPS
800.0000 mg | ORAL_CAPSULE | Freq: Three times a day (TID) | ORAL | Status: DC
  Administered 2021-01-14 – 2021-01-15 (×3): 800 mg via ORAL
  Filled 2021-01-14 (×3): qty 2

## 2021-01-14 MED ORDER — ALBUTEROL SULFATE (2.5 MG/3ML) 0.083% IN NEBU: 2.5000 mg | INHALATION_SOLUTION | Freq: Four times a day (QID) | RESPIRATORY_TRACT | Status: DC | PRN

## 2021-01-14 MED ORDER — QUETIAPINE FUMARATE 100 MG PO TABS
200.0000 mg | ORAL_TABLET | Freq: Every day | ORAL | Status: DC
  Administered 2021-01-14: 200 mg via ORAL
  Filled 2021-01-14: qty 2

## 2021-01-14 NOTE — ED Notes (Signed)
Pt appears to be sleeping at this time. Will open eyes and awaken to voice.

## 2021-01-14 NOTE — ED Provider Notes (Signed)
[facility] DEPT Provider Note   CSN: [id number] Arrival date & time: 01/14/21  1408     History  Chief Complaint  Patient presents with   Altered Mental Status    [name] is a 51 y.o. female. Level 5 caveat due to altered mental status.  Altered Mental Status Reportedly was at the gastroenterologist office.  Became more confused.  Began to take off her clothes.  Really will not provide any history here.  Will not talk or follow commands.  Is looking around and will move all extremities.  Recent admission to the hospital discharged about a week ago.  History of interstitial lung disease.  History of gastric outlet obstruction.  Chronic malnutrition.  Also discharged on Augmentin.  Reported history of mood disorder on BuSpar Seroquel trazodone.  Has J-tube for feedings.    Home Medications Prior to Admission medications   Medication Sig Start Date End Date Taking? Authorizing Provider  acetaminophen (TYLENOL) 325 MG tablet Take 1-2 tablets (325-650 mg total) by mouth every 6 (six) hours as needed for mild pain (pain score 1-3 or temp > 100.5). 06/19/17   Ainsley Spinner, PA-C  albuterol (VENTOLIN HFA) 108 (90 Base) MCG/ACT inhaler Inhale 1-2 puffs into the lungs every 4 (four) hours as needed for shortness of breath or wheezing. 08/07/19   [provider]  albuterol (VENTOLIN HFA) 108 (90 Base) MCG/ACT inhaler Inhale 1-2 puffs into the lungs 4 (four) times daily as needed for shortness of breath or wheezing. 12/26/20   [provider]  amoxicillin-clavulanate (AUGMENTIN) 875-125 MG tablet Take 1 tablet by mouth 2 (two) times daily. 01/05/21   Ghimire, Henreitta Leber, MD  Aspirin-Salicylamide-Caffeine (BC HEADACHE PO) Take 1 packet by mouth daily as needed (pain/headache).     [provider]  busPIRone (BUSPAR) 15 MG tablet Take 15 mg by mouth 2 (two) times daily. 04/30/20   [provider]  busPIRone (BUSPAR) 15 MG tablet Take  15 mg by mouth 2 (two) times daily. 12/28/20   [provider]  cyclobenzaprine (FLEXERIL) 5 MG tablet Take 5 mg by mouth 3 (three) times daily as needed for muscle spasms. 12/19/20   [provider]  dronabinol (MARINOL) 2.5 MG capsule TAKE 2 CAPSULES BY MOUTH IN THE MORNING AND 1 CAP IN THE EVENING 12/09/20   Pyrtle, Lajuan Lines, MD  dronabinol (MARINOL) 2.5 MG capsule Take 2.5 mg by mouth daily. 12/30/20   [provider]  gabapentin (NEURONTIN) 600 MG tablet Take 600 mg by mouth 3 (three) times daily. 07/23/19   [provider]  gabapentin (NEURONTIN) 800 MG tablet Take 800 mg by mouth 3 (three) times daily. 12/16/20   [provider]  hydrOXYzine (ATARAX) 25 MG tablet Take 50 mg by mouth at bedtime. 12/14/20   [provider]  hydrOXYzine (ATARAX/VISTARIL) 25 MG tablet Take 25 mg by mouth at bedtime as needed for sleep. 02/08/17   [provider]  naloxone [facility]) nasal spray 4 mg/0.1 mL Place 1 spray into the nose once as needed. 10/08/20   [provider]  Nutritional Supplements (FEEDING SUPPLEMENT, OSMOLITE 1.2 CAL,) LIQD 3 cans daily per tube feeding 05/06/20   Pyrtle, Lajuan Lines, MD  Nutritional Supplements (OSMOLITE 1.5 CAL PO) Give 2.5 Cans by tube at bedtime. Pt states that she infuses this via an infusion pump over 8-10 hours    [provider]  omeprazole (PRILOSEC) 40 MG capsule 1 CAPSULE BY MOUTH IN MORNING AND AT  BEDTIME. OPEN 1 CAPSULE AND SPRINKLE IN APPLESAUCE AND EAT. 12/16/20   Pyrtle, Lajuan Lines, MD  ondansetron (ZOFRAN ODT) 4 MG disintegrating tablet Take 1 tablet (4 mg total) by mouth every 6 (six) hours as needed for nausea or vomiting. 08/27/20   Pyrtle, Lajuan Lines, MD  ondansetron (ZOFRAN-ODT) 4 MG disintegrating tablet TAKE 1 TABLET (4 MG TOTAL) BY MOUTH EVERY 6 (SIX) HOURS AS NEEDED FOR NAUSEA OR VOMITING 01/08/21   Pyrtle, Lajuan Lines, MD  ondansetron (ZOFRAN-ODT) 4 MG disintegrating tablet Take 4 mg by mouth every 6 (six)  hours as needed for nausea or vomiting. 10/01/20   [provider]  oxyCODONE-acetaminophen (PERCOCET) 7.5-325 MG tablet Take 1 tablet by mouth 4 (four) times daily. 12/18/20   [provider]  oxyCODONE-acetaminophen (PERCOCET/ROXICET) 5-325 MG tablet Take 1 tablet by mouth 4 (four) times daily. 11/19/20   [provider]  pantoprazole (PROTONIX) 40 MG tablet Take 1 tablet (40 mg total) by mouth 2 (two) times daily. 01/05/21 01/05/22  Ghimire, Henreitta Leber, MD  polyvinyl alcohol (LIQUIFILM TEARS) 1.4 % ophthalmic solution Place 1 drop into both eyes as needed for dry eyes.    [provider]  predniSONE (DELTASONE) 20 MG tablet Take 2 tablets (40 mg total) by mouth daily with breakfast. 01/06/21   Ghimire, Henreitta Leber, MD  promethazine (PHENERGAN) 25 MG tablet Take 1 tablet (25 mg total) by mouth 3 (three) times daily as needed. 08/27/20   Pyrtle, Lajuan Lines, MD  QUEtiapine (SEROQUEL) 200 MG tablet Take 200 mg by mouth at bedtime. 06/22/19   [provider]  QUEtiapine (SEROQUEL) 200 MG tablet Take 200 mg by mouth at bedtime. 12/14/20   [provider]  sucralfate (CARAFATE) 1 GM/10ML suspension Take 10 mLs (1 g total) by mouth 4 (four) times daily -  with meals and at bedtime. 01/05/21   Ghimire, Henreitta Leber, MD  traZODone (DESYREL) 50 MG tablet Take 50 mg by mouth at bedtime.    [provider]  traZODone (DESYREL) 50 MG tablet Take 100 mg by mouth at bedtime. 10/11/20   [provider]      Allergies    Sulfamethoxazole, Morphine and related, Xanax xr [alprazolam er], Morphine and related, and Sulfa antibiotics    Review of Systems   Review of Systems  Unable to perform ROS: Mental status change   Physical Exam Updated Vital Signs BP (!) 158/80    Pulse (!) 56    Temp 98.7 F (37.1 C) (Oral)    Resp 12    SpO2 100%  Physical Exam Vitals and nursing note reviewed.  Constitutional:      Appearance: Normal appearance.  HENT:      Head: Atraumatic.  Eyes:     Comments: Pupils somewhat constricted.  Cardiovascular:     Rate and Rhythm: Regular rhythm.  Pulmonary:     Breath sounds: No wheezing or rhonchi.  Abdominal:     Tenderness: There is no abdominal tenderness. There is no guarding.     Comments: J-tube in left abdomen.  No tenderness.  Musculoskeletal:        General: No tenderness.  Skin:    General: Skin is warm.  Neurological:     Comments: Nonverbal.  Moving all extremities.  Temp to get out of bed but will not follow commands or respond to my voice.    ED Results / Procedures / Treatments   Labs (all labs ordered are listed, but only abnormal results are  displayed) Labs Reviewed  COMPREHENSIVE METABOLIC PANEL - Abnormal; Notable for the following components:      Result Value   Potassium 3.2 (*)    Glucose, Bld 120 (*)    Albumin 3.4 (*)    All other components within normal limits  CBC WITH DIFFERENTIAL/PLATELET - Abnormal; Notable for the following components:   Hemoglobin 11.9 (*)    RDW 17.2 (*)    Platelets 471 (*)    Abs Immature Granulocytes 0.17 (*)    All other components within normal limits  CBG MONITORING, ED - Abnormal; Notable for the following components:   Glucose-Capillary 123 (*)    All other components within normal limits  ETHANOL  RAPID URINE DRUG SCREEN, HOSP PERFORMED  URINALYSIS, ROUTINE W REFLEX MICROSCOPIC    EKG None  Radiology CT HEAD WO CONTRAST (5MM)  Result Date: 01/14/2021 CLINICAL DATA:  Mental status change, unknown cause. EXAM: CT HEAD WITHOUT CONTRAST TECHNIQUE: Contiguous axial images were obtained from the base of the skull through the vertex without intravenous contrast. COMPARISON:  CT head without contrast 06/16/2016 FINDINGS: Brain: No acute infarct, hemorrhage, or mass lesion is present. The ventricles are of normal size. No significant extraaxial fluid collection is present. Vascular: Fluid is present in the sphenoid sinuses bilaterally.  Scattered mucosal thickening is present throughout the ethmoid air cells. Minimal fluid is noted in the maxillary sinuses as well. The mastoid air cells are clear. Skull: Calvarium is intact. No focal lytic or blastic lesions are present. No significant extracranial soft tissue lesion is present. Sinuses/Orbits: The paranasal sinuses and mastoid air cells are clear. The globes and orbits are within normal limits. IMPRESSION: 1. Normal CT appearance of the brain. 2. Acute on chronic sinus disease. Electronically Signed   By: San Morelle M.D.   On: 01/14/2021 15:43   DG Chest Portable 1 View  Result Date: 01/14/2021 CLINICAL DATA:  Provided history: Altered mental status. EXAM: PORTABLE CHEST 1 VIEW COMPARISON:  Chest CT 01/03/2021.  Chest radiograph 01/02/2021. FINDINGS: Heart size within normal limits. Although improved from the prior chest radiograph of 01/02/2021, there is persistent airspace disease within the right mid-to-lower lung field and left lung base. Additionally, there is a 7 mm nodular opacity within the right mid lung, which is new from the prior exam (see annotation on image). No appreciable pleural effusion or pneumothorax. No acute bony abnormality identified. Lower thoracic levocurvature. IMPRESSION: Persistent, although improved, airspace disease within the right mid-to-lower lung field and left lung base. Radiographic follow-up to resolution recommended. New 7 mm nodular opacity within the right mid lung. This is likely infectious/inflammatory in this clinical setting. However, 4-6 week follow-up PA and lateral chest radiographs are recommended. If this finding persists at time of radiographic follow-up, a chest CT is recommended for further characterization. Electronically Signed   By: Kellie Simmering D.O.   On: 01/14/2021 15:18    Procedures Procedures    Medications Ordered in ED Medications  naloxone [facility]) injection 0.4 mg (0.4 mg Intravenous Not Given 01/14/21 1537)   LORazepam (ATIVAN) injection 0.5 mg (0.5 mg Intravenous Given 01/14/21 1537)    ED Course/ Medical Decision Making/ A&P Clinical Course as of 01/14/21 1623  Tue Jan 14, 2021  1619 Comprehensive metabolic panel(!) [NP]  8546 CBC with Differential(!) Mild anemia. [NP]  2703 Comprehensive metabolic panel(!) Hypokalemia. [NP]  5009 Ethanol [NP]  3818 DG Chest Portable 1 View Stable pneumonia.  Possible new nodule likely infectious. [NP]  2993 CT HEAD WO  CONTRAST (5MM) Reassuring.  Possible sinusitis. [NP]    Clinical Course User Index [NP] Davonna Belling, MD                           Medical Decision Making Amount and/or Complexity of Data Reviewed Independent Historian: caregiver External Data Reviewed: notes. Labs:  Decision-making details documented in ED Course. Radiology:  Decision-making details documented in ED Course. ECG/medicine tests:  Decision-making details documented in ED Course.  Risk Prescription drug management. Decision regarding hospitalization.   Patient with altered mental status.  Came in from gastroenterology by EMS.  Was reportedly normal and then while in the waiting room became altered.  Taking off close here.  Reportedly had been speaking after some time here but for me still sleepy.  Head CT reassuring.  Lab work reassuring.  Is on steroids and antibiotics.  Chest x-ray showed continued pneumonia.  Urinalysis still pending.  Likely will require admission to the hospital.  Care will be turned over to Dr. Karle Starch.        Final Clinical Impression(s) / ED Diagnoses Final diagnoses:  Altered mental status, unspecified altered mental status type    Rx / DC Orders ED Discharge Orders     None         Davonna Belling, MD 01/14/21 1623

## 2021-01-14 NOTE — ED Notes (Signed)
Pt

## 2021-01-14 NOTE — ED Notes (Signed)
Patient refused feeding supplements.

## 2021-01-14 NOTE — ED Notes (Signed)
Pt awakens to voice. Alert to self and place disoriented to situation and time. Speech is clear and patient is able to tell medical history

## 2021-01-14 NOTE — Progress Notes (Signed)
° °  Assessment / Plan:   #  51 yo female with acute mental status changes of unclear etiology.  --EMS called, patient transported to [place].   # History of PUD / gastric outlet obstruction s/p partial gastrectomy with vagotomy in 2018 with severe gastroparesis, malnutrition with J-tube. Persistent malnutrition, dislikes J-tube. Recently referred to [facility] to discuss surgical option such as diverting gastrojejunostomy.   # Esophageal wall thickening on chest CT scan December 2022 for which we saw her in consultation. She had been admitted for respiratory failure related to extensive bilateral pulmonary infiltrates . Outpatient EGD following resolution of respiratory failure was felt to be most appropriate --Needs EGD at some point. Given today's events she will need to be rescheduled for an office follow up prior to scheduling an EGD   [name] is a 51 y.o. female known to Dr. Hilarie Fredrickson with a past medical history of GERD with severe esophagitis, PUD causing gastric outlet obstruction s/p partial gastrectomy with bilateral vagotomy in 2018 with resultant severe gastroparesis, NSAID use / abuse, hypothyroidism, emphysema, anxiety, depression, migraines, malnutrition s/p  feeding jejunostomy.  Additional medical history as listed in [place] .   Patient was last seen in the office 11/29/222 for evaluation on ongoing malnutrition. She expressed dislike of the feeding tube. Dr Hilarie Fredrickson referred her for surgical evaluation ( ? Diverting gastrojejunostomy) . She uses Marinol  for chronic nausea / vomiting. Following that office visit patient was hospitalized in late December for acute respiratory failure  CT chest showed extensive bilateral pulmonary infiltrates, esophageal thickening for which we were consulted 01/03/21 . PCCM followed inpatient. She was treated with IV antibiotics and steroids. We decided outpatient EGD would be more reasonable given acute respiratory failure. Started on BID PPI and carafate  suspension. She was given this appointment.   Interval history:  UNABLE TO CARRY OUT VISIT TODAY. I WAS CALLED TO EXAM ROOM TO EVALUATE [name]'S ALTERED MENTAL STATUS. PER FRONT DESK STAFF PATIENT WAS WALKING / TALKING NORMALLY WHEN SHE ARRIVED. A FEW MINUTES LATER SHE DEVELOPED MENTAL STATUS CHANGES.   THIN WHITE FEMALE SITTING IN CHAIR TRYING TO REMOVE CLOTHES.  LOOKING AROUND ROOM, DOESN'T MAKE EYE CONTACT, PINPOINT PUPILS. NONVERBAl, NOT PARTICIPATING IN ANY FORM OF COMMUNICATION. DOES NOT FOLLOW COMMANDS. MAE. WEAKNESS OF BILATERAL LOWER EXTREMITIES. NORMAL RESPIRATORY EFFORT. ABDOMEN IS SOFT, MILDLY DISTENDED WITH MILD DIFFUSE TENDERNESS. J-TUBE INTACT.  02 SAT 95% BP 180/120, HR 110 WEIGHT; Patient unable to cooperate  CBG: unable to obtain right now  Patient will known to Dr. Hilarie Fredrickson who was called to the room to briefly evaluate patient. Given acute mental status changes the decision was made to have patient transported by EMS to ED for evaluation.    Current Medications: Reviewed most recent hospital discharge medications but unable to form with patient what she is actually taking.     Review of Systems: Unable to obtain  PHYSICAL EXAM :    Wt Readings from Last 3 Encounters:  01/03/21 77 lb (34.9 kg)  12/10/20 77 lb (34.9 kg)  08/27/20 80 lb 3.2 oz (36.4 kg)    Tye Savoy, NP  01/14/2021, 6:59 PM  Cc:  Cyndi Bender, PA-C

## 2021-01-14 NOTE — ED Provider Notes (Signed)
Care of the patient assumed at the change of shift, recently admitted for ILD and hypoxia. Has a J-tube for feedings. She was at [place] and began acting bizarrely, taking off her clothes. Has been on steroids recently. Awaiting UA and dispo.  Physical Exam  BP (!) 149/100    Pulse 78    Temp 98.7 F (37.1 C) (Oral)    Resp 15    SpO2 100%   Physical Exam  ED Course/Procedures   Clinical Course as of 01/14/21 1711  Tue Jan 14, 2021  1619 Comprehensive metabolic panel(!) [NP]  5520 CBC with Differential(!) Mild anemia. [NP]  8022 Comprehensive metabolic panel(!) Hypokalemia. [NP]  3361 Ethanol [NP]  2244 DG Chest Portable 1 View Stable pneumonia.  Possible new nodule likely infectious. [NP]  9753 CT HEAD WO CONTRAST (5MM) Reassuring.  Possible sinusitis. [NP]  0051 UA without signs of infection.  [CS]  1021 Patient remains somnolent, does not answer questions, mumbles to verbal stimuli. Pupils are not pinpoint at this time. Will discuss admission with the Hospitalist.  [CS]  1705 Spoke with Dr Olevia Bowens, Hospitalist, who will evaluate for admission.  [CS]    Clinical Course User Index [CS] Truddie Hidden, MD [NP] Davonna Belling, MD    Procedures  MDM  Medical Decision Making Problems Addressed: Altered mental status, unspecified altered mental status type: complicated acute illness or injury  Amount and/or Complexity of Data Reviewed Labs:  Decision-making details documented in ED Course. Radiology: independent interpretation performed. Decision-making details documented in ED Course.  Risk Decision regarding hospitalization.          Truddie Hidden, MD 01/14/21 [phone]

## 2021-01-14 NOTE — ED Triage Notes (Signed)
BIB EMS from GI office, A&O x 4 on arrival then stopped talking and started to remove clothes. She recently had a feeding tube and discharged from hosp Dec 25. Has mental health history.

## 2021-01-14 NOTE — ED Notes (Signed)
Pt is alert and oriented at this time. Pt does not remember what happened or why she is here. Pt was tearful asking what is wrong with her. She is answering questions appropriately at this time. Swallowed pills without difficulty and is on the phone calling family member at this time.

## 2021-01-14 NOTE — H&P (Signed)
History and Physical    OPEL LEJEUNE IHK:[id number] DOB: October 29, 1970 DOA: 01/14/2021  PCP: Cyndi Bender, PA-C   Patient coming from: Gastroenterologist office/home.  I have personally briefly reviewed patient's old medical records in [place]  Chief Complaint: Altered mental status.  HPI: [name] is a 51 y.o. female with medical history significant of allergic rhinitis, iron deficiency anemia, anxiety, depression, history of aspiration pneumonia, right calcaneus fracture, chronic interstitial cystitis, chronic pain syndrome, fibromyalgia, IBS, gastric outlet obstruction, gastric ulcer, GERD, hiatal hernia, urolithiasis, vitamin D deficiency, vitamin B12 deficiency, recently admitted and discharged on a prednisone taper due to interstitial pneumonia who was at the gastroenterologist office today when she became confused and started taking her close follow-up.  EMS was called and she was brought to the emergency department.  She was given lorazepam and is not able to provide further history at the moment.  ED Course: Initial vital signs were temperature 98.7 F, pulse 93, respiration 16, BP 135/91 mmHg O2 sat 97% on room air.  The patient was given lorazepam 0.5 mg IVP x1.  Lab work: Urinalysis with low specific gravity but otherwise was unremarkable.  UDS was positive for THC because the patient is on Marinol.  CBC is her white count 9.4, hemoglobin 11.9 g/dL platelets 471.  Alcohol level was normal.  CMP showed a potassium of 3.2 mmol/L, glucose of 120 mg/dL and albumin of 3.4 g/dL.  The rest of the CMP results were within normal limits.  Imaging: Portable 1 view chest radiograph showed improved airspace disease with the right mid to lower lung fields and left lung base.  Radiographic follow-up to resolution recommended.  There is a new 7 mm nodular opacity within the right midlung.  Likely infectious but 4 to 6 weeks follow-up recommended.  CT chest recommended if this is still  present on follow-up imaging.  CT head did not show any acute intracranial normality.  There was acute on chronic sinus disease.  Please see images and full radiology report for further details.  Review of Systems: As per HPI otherwise all other systems reviewed and are negative.  Past Medical History:  Diagnosis Date   Allergic rhinitis    Anemia    Anxiety    Anxiety disorder    Aspiration into airway    per pt hx on 08/07/19 aspirates food into lungs when asleep    Calcaneus fracture, right 06/19/2017   Chronic interstitial cystitis    Chronic pain 06/19/2017   COPD (chronic obstructive pulmonary disease) (HCC)    Depression    Fibromyalgia    Gastric outlet obstruction    Gastric ulcer    GERD (gastroesophageal reflux disease)    Hiatal hernia    History of kidney stones    Hypothyroidism    in the past   IBS (irritable bowel syndrome)    IDA (iron deficiency anemia)    Internal hemorrhoids    Kidney stones    Myalgia    OCD (obsessive compulsive disorder)    Ulcerative esophagitis    Vitamin D deficiency     Past Surgical History:  Procedure Laterality Date   67 HOUR [place] STUDY N/A 03/15/2019   Procedure: 24 HOUR [place] STUDY;  Surgeon: Jerene Bears, MD;  Location: [facility];  Service: Gastroenterology;  Laterality: N/A;   ABDOMINAL HYSTERECTOMY     BILROTH I PROCEDURE  06/22/2016   Dr Pauletta Browns---- due to gastric ulcer   ESOPHAGEAL MANOMETRY N/A 03/15/2019  Procedure: ESOPHAGEAL MANOMETRY (EM);  Surgeon: Jerene Bears, MD;  Location: [facility];  Service: Gastroenterology;  Laterality: N/A;   EXTERNAL EAR SURGERY     dont know specific ear sugery (inner/external)   FRACTURE SURGERY     right arm   gastrectomy and bilateral vagotomy      IR CM INJ ANY COLONIC TUBE W/FLUORO  12/30/2020   IR REPLC DUODEN/JEJUNO TUBE PERCUT W/FLUORO  09/15/2019   IR REPLC DUODEN/JEJUNO TUBE PERCUT W/FLUORO  07/23/2020   IR REPLC DUODEN/JEJUNO TUBE PERCUT W/FLUORO  09/23/2020   IR REPLC  DUODEN/JEJUNO TUBE PERCUT W/FLUORO  12/27/2020   IR SINUS/FIST TUBE CHK-NON GI  03/14/2020   LAPAROSCOPIC JEJUNOSTOMY N/A 08/09/2019   Procedure: LAPAROSCOPIC FEEDING JEJUNOSTOMY;  Surgeon: Johnathan Hausen, MD;  Location: [facility];  Service: General;  Laterality: N/A;   mutliple surgeries due to intertitial cystitis     bladder stimulator   ORIF TIBIA FRACTURE Right 06/17/2017   Procedure: OPEN REDUCTION INTERNAL FIXATION (ORIF) DISTAL TIBIA FRACTURE AND CALCANEOUS;  Surgeon: Altamese Leelanau, MD;  Location: [facility];  Service: Orthopedics;  Laterality: Right;   OTHER SURGICAL HISTORY     TENS implantation for Chronic Interstitial Cystitis   PARTIAL GASTRECTOMY      Social History  reports that she has been smoking cigarettes. She has been smoking an average of .5 packs per day. She has never used smokeless tobacco. She reports that she does not currently use alcohol. She reports that she does not use drugs.  Allergies  Allergen Reactions   Sulfamethoxazole Anaphylaxis   Morphine And Related Other (See Comments)    Delirium. Makes me go "crazy"   Xanax Xr [Alprazolam Er] Other (See Comments)    Enhanced depression; feels gloomy   Morphine And Related    Sulfa Antibiotics     Family History  Problem Relation Age of Onset   Clotting disorder Mother    Crohn's disease Mother    Heart disease Mother    Emphysema Mother    Heart disease Father    Breast cancer Maternal Aunt    Colon cancer Neg Hx    Stomach cancer Neg Hx    Rectal cancer Neg Hx    Esophageal cancer Neg Hx    Prior to Admission medications   Medication Sig Start Date End Date Taking? Authorizing Provider  acetaminophen (TYLENOL) 325 MG tablet Take 1-2 tablets (325-650 mg total) by mouth every 6 (six) hours as needed for mild pain (pain score 1-3 or temp > 100.5). 06/19/17   Ainsley Spinner, PA-C  albuterol (VENTOLIN HFA) 108 (90 Base) MCG/ACT inhaler Inhale 1-2 puffs into the lungs every 4 (four) hours as needed for shortness of  breath or wheezing. 08/07/19   [provider]  albuterol (VENTOLIN HFA) 108 (90 Base) MCG/ACT inhaler Inhale 1-2 puffs into the lungs 4 (four) times daily as needed for shortness of breath or wheezing. 12/26/20   [provider]  amoxicillin-clavulanate (AUGMENTIN) 875-125 MG tablet Take 1 tablet by mouth 2 (two) times daily. 01/05/21   Ghimire, Henreitta Leber, MD  Aspirin-Salicylamide-Caffeine (BC HEADACHE PO) Take 1 packet by mouth daily as needed (pain/headache).     [provider]  busPIRone (BUSPAR) 15 MG tablet Take 15 mg by mouth 2 (two) times daily. 04/30/20   [provider]  busPIRone (BUSPAR) 15 MG tablet Take 15 mg by mouth 2 (two) times daily. 12/28/20   [provider]  cyclobenzaprine (FLEXERIL) 5 MG tablet Take 5 mg  by mouth 3 (three) times daily as needed for muscle spasms. 12/19/20   [provider]  dronabinol (MARINOL) 2.5 MG capsule TAKE 2 CAPSULES BY MOUTH IN THE MORNING AND 1 CAP IN THE EVENING 12/09/20   Pyrtle, Lajuan Lines, MD  dronabinol (MARINOL) 2.5 MG capsule Take 2.5 mg by mouth daily. 12/30/20   [provider]  gabapentin (NEURONTIN) 600 MG tablet Take 600 mg by mouth 3 (three) times daily. 07/23/19   [provider]  gabapentin (NEURONTIN) 800 MG tablet Take 800 mg by mouth 3 (three) times daily. 12/16/20   [provider]  hydrOXYzine (ATARAX) 25 MG tablet Take 50 mg by mouth at bedtime. 12/14/20   [provider]  hydrOXYzine (ATARAX/VISTARIL) 25 MG tablet Take 25 mg by mouth at bedtime as needed for sleep. 02/08/17   [provider]  naloxone [facility]) nasal spray 4 mg/0.1 mL Place 1 spray into the nose once as needed. 10/08/20   [provider]  Nutritional Supplements (FEEDING SUPPLEMENT, OSMOLITE 1.2 CAL,) LIQD 3 cans daily per tube feeding 05/06/20   Pyrtle, Lajuan Lines, MD  Nutritional Supplements (OSMOLITE 1.5 CAL PO) Give 2.5 Cans by tube at bedtime. Pt states that she infuses  this via an infusion pump over 8-10 hours    [provider]  omeprazole (PRILOSEC) 40 MG capsule 1 CAPSULE BY MOUTH IN MORNING AND AT BEDTIME. OPEN 1 CAPSULE AND SPRINKLE IN APPLESAUCE AND EAT. 12/16/20   Pyrtle, Lajuan Lines, MD  ondansetron (ZOFRAN ODT) 4 MG disintegrating tablet Take 1 tablet (4 mg total) by mouth every 6 (six) hours as needed for nausea or vomiting. 08/27/20   Pyrtle, Lajuan Lines, MD  ondansetron (ZOFRAN-ODT) 4 MG disintegrating tablet TAKE 1 TABLET (4 MG TOTAL) BY MOUTH EVERY 6 (SIX) HOURS AS NEEDED FOR NAUSEA OR VOMITING 01/08/21   Pyrtle, Lajuan Lines, MD  ondansetron (ZOFRAN-ODT) 4 MG disintegrating tablet Take 4 mg by mouth every 6 (six) hours as needed for nausea or vomiting. 10/01/20   [provider]  oxyCODONE-acetaminophen (PERCOCET) 7.5-325 MG tablet Take 1 tablet by mouth 4 (four) times daily. 12/18/20   [provider]  oxyCODONE-acetaminophen (PERCOCET/ROXICET) 5-325 MG tablet Take 1 tablet by mouth 4 (four) times daily. 11/19/20   [provider]  pantoprazole (PROTONIX) 40 MG tablet Take 1 tablet (40 mg total) by mouth 2 (two) times daily. 01/05/21 01/05/22  Ghimire, Henreitta Leber, MD  polyvinyl alcohol (LIQUIFILM TEARS) 1.4 % ophthalmic solution Place 1 drop into both eyes as needed for dry eyes.    [provider]  predniSONE (DELTASONE) 20 MG tablet Take 2 tablets (40 mg total) by mouth daily with breakfast. 01/06/21   Ghimire, Henreitta Leber, MD  promethazine (PHENERGAN) 25 MG tablet Take 1 tablet (25 mg total) by mouth 3 (three) times daily as needed. 08/27/20   Pyrtle, Lajuan Lines, MD  QUEtiapine (SEROQUEL) 200 MG tablet Take 200 mg by mouth at bedtime. 06/22/19   [provider]  QUEtiapine (SEROQUEL) 200 MG tablet Take 200 mg by mouth at bedtime. 12/14/20   [provider]  sucralfate (CARAFATE) 1 GM/10ML suspension Take 10 mLs (1 g total) by mouth 4 (four) times daily -  with meals and at bedtime. 01/05/21   Ghimire, Henreitta Leber, MD   traZODone (DESYREL) 50 MG tablet Take 50 mg by mouth at bedtime.    [provider]  traZODone (DESYREL) 50 MG tablet Take 100 mg by mouth at bedtime. 10/11/20   [provider]    Physical Exam: Vitals:   01/14/21 1730 01/14/21 1800 01/14/21 1815 01/14/21 1830  BP: (!) 159/99 (!) 143/92 136/90 (!) 138/95  Pulse: 68 60 67 62  Resp: 16 13 14 13   Temp:      TempSrc:      SpO2: 97% 97% 97% 98%    Constitutional: Somnolent.  NAD. Eyes: PERRL, lids and conjunctivae normal ENMT: Mucous membranes are moist. Posterior pharynx clear of any exudate or lesions. Neck: normal, supple, no masses, no thyromegaly Respiratory: clear to auscultation bilaterally, no wheezing, no crackles. Normal respiratory effort. No accessory muscle use.  Cardiovascular: Regular rate and rhythm, no murmurs / rubs / gallops. No extremity edema. 2+ pedal pulses. No carotid bruits.  Abdomen: PEG tube in place.  No distention.  Soft, no tenderness, no masses palpated. No hepatosplenomegaly. Bowel sounds positive.  Musculoskeletal: no clubbing / cyanosis. Good ROM, no contractures. Normal muscle tone.  Skin: no acute rashes, lesions, ulcers on very limited examination. Neurologic: Grossly nonfocal on limited exam. Psychiatric: Somnolent.  Wakes up briefly.  Answer simple questions.  Labs on Admission: I have personally reviewed following labs and imaging studies  CBC: Recent Labs  Lab 01/14/21 1437  WBC 9.4  NEUTROABS 7.0  HGB 11.9*  HCT 38.0  MCV 86.0  PLT 471*    Basic Metabolic Panel: Recent Labs  Lab 01/14/21 1437  NA 139  K 3.2*  CL 108  CO2 22  GLUCOSE 120*  BUN 9  CREATININE 0.59  CALCIUM 8.9    GFR: Estimated Creatinine Clearance: 47.4 mL/min (by C-G formula based on SCr of 0.59 mg/dL).  Liver Function Tests: Recent Labs  Lab 01/14/21 1437  AST 16  ALT 15  ALKPHOS 79  BILITOT 0.6  PROT 6.8  ALBUMIN 3.4*    Urine analysis:    Component Value Date/Time    COLORURINE COLORLESS (A) 01/14/2021 1558   APPEARANCEUR CLEAR 01/14/2021 1558   LABSPEC 1.002 (L) 01/14/2021 1558   PHURINE 7.0 01/14/2021 1558   GLUCOSEU NEGATIVE 01/14/2021 1558   HGBUR NEGATIVE 01/14/2021 1558   BILIRUBINUR NEGATIVE 01/14/2021 1558   KETONESUR NEGATIVE 01/14/2021 1558   PROTEINUR NEGATIVE 01/14/2021 1558   UROBILINOGEN 0.2 03/07/2007 0325   NITRITE NEGATIVE 01/14/2021 1558   LEUKOCYTESUR NEGATIVE 01/14/2021 1558    Radiological Exams on Admission: CT HEAD WO CONTRAST (5MM)  Result Date: 01/14/2021 CLINICAL DATA:  Mental status change, unknown cause. EXAM: CT HEAD WITHOUT CONTRAST TECHNIQUE: Contiguous axial images were obtained from the base of the skull through the vertex without intravenous contrast. COMPARISON:  CT head without contrast 06/16/2016 FINDINGS: Brain: No acute infarct, hemorrhage, or mass lesion is present. The ventricles are of normal size. No significant extraaxial fluid collection is present. Vascular: Fluid is present in the sphenoid sinuses bilaterally. Scattered mucosal thickening is present throughout the ethmoid air cells. Minimal fluid is noted in the maxillary sinuses as well. The mastoid air cells are clear. Skull: Calvarium is intact. No focal lytic or blastic lesions are present. No significant extracranial soft tissue lesion is present. Sinuses/Orbits: The paranasal sinuses and mastoid air cells are clear. The globes and orbits are within normal limits. IMPRESSION: 1. Normal CT appearance of the brain. 2. Acute on chronic sinus disease. Electronically Signed   By: San Morelle M.D.   On: 01/14/2021 15:43   DG Chest Portable 1 View  Result Date: 01/14/2021 CLINICAL DATA:  Provided history: Altered mental status. EXAM: PORTABLE CHEST 1 VIEW COMPARISON:  Chest CT 01/03/2021.  Chest radiograph 01/02/2021. FINDINGS: Heart size within normal limits. Although improved from the prior chest radiograph of 01/02/2021, there is persistent airspace  disease within the right mid-to-lower lung field and left lung base. Additionally, there is a 7 mm nodular opacity within the right mid lung, which is new from the prior exam (see annotation on image). No appreciable pleural effusion or pneumothorax. No acute bony abnormality identified. Lower thoracic levocurvature. IMPRESSION: Persistent, although improved, airspace disease within the right mid-to-lower lung field and left lung base. Radiographic follow-up to resolution recommended. New 7 mm nodular opacity within the right mid lung. This is likely infectious/inflammatory in this clinical setting. However, 4-6 week follow-up PA and lateral chest radiographs are recommended. If this finding persists at time of radiographic follow-up, a chest CT is recommended for further characterization. Electronically Signed   By: Kellie Simmering D.O.   On: 01/14/2021 15:18    EKG: Independently reviewed.  Vent. rate 61 BPM PR interval 128 ms QRS duration 84 ms QT/QTcB 454/458 ms P-R-T axes 39 71 63 Sinus rhythm Low voltage, precordial leads Probable anteroseptal infarct, old  Assessment/Plan Principal Problem:   Acute encephalopathy Observation/MedSurg. One-to-one observation. Hold prednisone. Check TSH level. Check B12 level. Neurochecks every 4 hours. Consider psych evaluation in a.m.  Active Problems:   Hypothyroidism Not on levothyroxine. Check TSH level.    GERD (gastroesophageal reflux disease) Continue Protonix twice daily.    Depression Continue Seroquel 200 mg p.o. bedtime. Continue buspirone 15 mg p.o. 2 times daily    Iron deficiency anemia Monitor hematocrit and hemoglobin.    Protein-calorie malnutrition, severe Continue protein supplementation via PEG tube.    COPD (chronic obstructive pulmonary disease) (HCC) No dyspnea or wheezing at this time. Supplemental oxygen as needed. Bronchodilators as needed.   DVT prophylaxis: SCDs. Code Status:   Full code. Family  Communication:   Disposition Plan:   Patient is from:  Home.  Anticipated DC to:  Home.  Anticipated DC date:  24 to 48 hours.  Anticipated DC barriers: Clinical condition.  Consults called:   Admission status:  .  Severity of Illness: High severity in the setting of metabolic encephalopathy with no clear etiology.  Reubin Milan MD [facility]  How to contact the [facility] Attending or Consulting provider [place] or covering provider during after hours [place], for this patient?   Check the care team in [facility] and look for a) attending/consulting [facility] provider listed and b) the [facility] team listed Log into [email or web address] and use [place]'s universal password to access. If you do not have the password, please contact the hospital operator. Locate the [facility] provider you are looking for under [facility] and page to a number that you can be directly reached. If you still have difficulty reaching the provider, please page the [facility] (Director on Call) for the Hospitalists listed on amion for assistance.  01/14/2021, 7:22 PM   This document was prepared using Dragon voice recognition software and may contain some unintended transcription errors.

## 2021-01-15 ENCOUNTER — Other Ambulatory Visit (HOSPITAL_COMMUNITY): Payer: Medicare Other

## 2021-01-15 ENCOUNTER — Other Ambulatory Visit: Payer: Self-pay

## 2021-01-15 DIAGNOSIS — K2100 Gastro-esophageal reflux disease with esophagitis, without bleeding: Secondary | ICD-10-CM

## 2021-01-15 DIAGNOSIS — E43 Unspecified severe protein-calorie malnutrition: Secondary | ICD-10-CM | POA: Diagnosis not present

## 2021-01-15 DIAGNOSIS — F339 Major depressive disorder, recurrent, unspecified: Secondary | ICD-10-CM

## 2021-01-15 DIAGNOSIS — R4182 Altered mental status, unspecified: Secondary | ICD-10-CM | POA: Diagnosis not present

## 2021-01-15 DIAGNOSIS — E039 Hypothyroidism, unspecified: Secondary | ICD-10-CM

## 2021-01-15 DIAGNOSIS — G9340 Encephalopathy, unspecified: Secondary | ICD-10-CM | POA: Diagnosis not present

## 2021-01-15 LAB — VITAMIN B12: Vitamin B-12: 349 pg/mL (ref 180–914)

## 2021-01-15 LAB — POTASSIUM: Potassium: 3.6 mmol/L (ref 3.5–5.1)

## 2021-01-15 LAB — TSH: TSH: 9.344 u[IU]/mL — ABNORMAL HIGH (ref 0.350–4.500)

## 2021-01-15 MED ORDER — POTASSIUM CHLORIDE CRYS ER 20 MEQ PO TBCR
40.0000 meq | EXTENDED_RELEASE_TABLET | Freq: Once | ORAL | Status: AC
  Administered 2021-01-15: 40 meq via ORAL
  Filled 2021-01-15: qty 2

## 2021-01-15 MED ORDER — AMLODIPINE BESYLATE 5 MG PO TABS: 5.0000 mg | ORAL_TABLET | Freq: Every day | ORAL | 3 refills | Status: DC

## 2021-01-15 MED ORDER — LEVOTHYROXINE SODIUM 25 MCG PO TABS: 25.0000 ug | ORAL_TABLET | Freq: Every day | ORAL | 3 refills | Status: AC

## 2021-01-15 NOTE — Progress Notes (Signed)
Addendum: Reviewed and agree with assessment and management plan. Pt may end up being admitted at [facility].  Agree with need for EGD. Very difficult situation and patient with malnutrition, nonadherence to feeding J-tube.  Her adherence is complicated by depression.  Very poor p.o. intake due to severe gastroparesis in the setting of prior partial gastrectomy and vagotomy.  Severe esophagitis due to stasis and gastroparesis. I think she should visit with surgery to see if some sort of diverting gastrojejunostomy would allow her to eat with less gastroparetic type symptoms Chamberlain Steinborn, Lajuan Lines, MD

## 2021-01-15 NOTE — Discharge Summary (Signed)
Physician Discharge Summary  [name] DDU:[id number] DOB: [birth year] DOA: 01/14/2021  PCP: Cyndi Bender, PA-C  Admit date: 01/14/2021 Discharge date: 01/15/2021  Time spent: 60 minutes  Recommendations for Outpatient Follow-up:  Follow-up gastroenterology in 1 week Follow-up PCP in 1 week, check TSH in 6 to 8 weeks as outpatient  Discharge Diagnoses:  Principal Problem:   Acute encephalopathy Active Problems:   GERD (gastroesophageal reflux disease)   Depression   Iron deficiency anemia   Protein-calorie malnutrition, severe   COPD (chronic obstructive pulmonary disease) ([place])   Hypothyroidism   Altered mental status   Discharge Condition: Stable  Diet recommendation: Heart healthy diet  There were no vitals filed for this visit.  History of present illness:  51 year old female with a history of allergic rhinitis, iron-deficiency anemia, anxiety, depression, history of aspiration pneumonia, right calcaneus fracture, chronic interstitial cystitis, chronic pain syndrome, fibromyalgia, IBS, gastric outlet obstruction, gastric ulcer, GERD, hiatal hernia, urolithiasis, vitamin D deficiency, who was recently discharged from the hospital on prednisone taper due to interstitial pneumonia.  She was at gastroenterologist office yesterday when she became confused, started taking her clothes off.  EMS was called and patient was brought to the ED.  She was given lorazepam and unable to provide any history. CT head did not show any acute abnormality except for acute on chronic sinus disease  Hospital Course:   Acute encephalopathy -Resolved -Likely from steroids versus hypertensive urgency -CT head was unremarkable -Steroids have been discontinued -TSH 9.344, will start Synthroid 25 mcg daily - will need to check TSH in 6 to 8 weeks  Hypothyroidism -TSH 9.344 -Started on Synthroid 25 mcg daily as above -Recheck TSH in 6 to 8 weeks  Hypertension -Patient presented with  hypertensive urgency -Blood pressure is mildly elevated -We will start amlodipine 5 mg daily  Depression -Continue Seroquel, buspirone  GERD with ulcerative esophagitis/peptic ulcer disease -Gastroenterology was consulted -Recommended to continue PPI and Carafate -They will see as outpatient for EGD  Gastroparesis with subsequent laparoscopic jejunostomy on 7/21 -Follow-up GI as outpatient   Procedures:   Consultations:   Discharge Exam: Vitals:   01/15/21 1500 01/15/21 1558  BP: (!) 122/95 (!) 138/94  Pulse: 77 77  Resp: 20 18  Temp: 98.2 F (36.8 C) 98.4 F (36.9 C)  SpO2: 96% 95%    General: Appears in no acute distress Cardiovascular: S1-S2, regular, no murmur auscultated Respiratory: Clear to auscultation bilaterally  Discharge Instructions   Discharge Instructions     Diet - low sodium heart healthy   Complete by: As directed    Increase activity slowly   Complete by: As directed       Allergies as of 01/15/2021       Reactions   Sulfamethoxazole Anaphylaxis   Morphine And Related Other (See Comments)   Delirium. Makes me go "crazy"   Xanax Xr [alprazolam Er] Other (See Comments)   Enhanced depression; feels gloomy   Morphine And Related    Sulfa Antibiotics         Medication List     STOP taking these medications    acetaminophen 325 MG tablet Commonly known as: TYLENOL   amoxicillin-clavulanate 875-125 MG tablet Commonly known as: Augmentin   omeprazole 40 MG capsule Commonly known as: PRILOSEC   predniSONE 20 MG tablet Commonly known as: DELTASONE       TAKE these medications    albuterol 108 (90 Base) MCG/ACT inhaler Commonly known as: VENTOLIN HFA Inhale 1-2 puffs into  the lungs 4 (four) times daily as needed for shortness of breath or wheezing.   amLODipine 5 MG tablet Commonly known as: NORVASC Take 1 tablet (5 mg total) by mouth daily.   BC HEADACHE PO Take 1 packet by mouth daily as needed (pain/headache).    busPIRone 15 MG tablet Commonly known as: BUSPAR Take 15 mg by mouth 2 (two) times daily.   cyclobenzaprine 5 MG tablet Commonly known as: FLEXERIL Take 5 mg by mouth 3 (three) times daily as needed for muscle spasms.   dronabinol 2.5 MG capsule Commonly known as: MARINOL TAKE 2 CAPSULES BY MOUTH IN THE MORNING AND 1 CAP IN THE EVENING What changed:  how much to take how to take this when to take this additional instructions   gabapentin 800 MG tablet Commonly known as: NEURONTIN Take 800 mg by mouth 3 (three) times daily.   hydrOXYzine 25 MG tablet Commonly known as: ATARAX Take 25-50 mg by mouth at bedtime as needed for sleep.   levothyroxine 25 MCG tablet Commonly known as: Synthroid Take 1 tablet (25 mcg total) by mouth daily before breakfast.   naloxone 4 MG/0.1ML Liqd nasal spray kit Commonly known as: NARCAN Place 1 spray into the nose once as needed (opioid reversal).   ondansetron 4 MG disintegrating tablet Commonly known as: ZOFRAN-ODT TAKE 1 TABLET (4 MG TOTAL) BY MOUTH EVERY 6 (SIX) HOURS AS NEEDED FOR NAUSEA OR VOMITING   OSMOLITE 1.5 CAL PO Give 2.5 Cans by tube at bedtime. Pt states that she infuses this via an infusion pump over 8-10 hours   oxyCODONE-acetaminophen 7.5-325 MG tablet Commonly known as: PERCOCET Take 1 tablet by mouth 4 (four) times daily.   pantoprazole 40 MG tablet Commonly known as: Protonix Take 1 tablet (40 mg total) by mouth 2 (two) times daily.   polyvinyl alcohol 1.4 % ophthalmic solution Commonly known as: LIQUIFILM TEARS Place 1 drop into both eyes as needed for dry eyes.   promethazine 25 MG tablet Commonly known as: PHENERGAN Take 1 tablet (25 mg total) by mouth 3 (three) times daily as needed.   QUEtiapine 200 MG tablet Commonly known as: SEROQUEL Take 200 mg by mouth at bedtime.   sucralfate 1 GM/10ML suspension Commonly known as: CARAFATE Take 10 mLs (1 g total) by mouth 4 (four) times daily -  with meals  and at bedtime.   traZODone 50 MG tablet Commonly known as: DESYREL Take 100 mg by mouth at bedtime.       Allergies  Allergen Reactions   Sulfamethoxazole Anaphylaxis   Morphine And Related Other (See Comments)    Delirium. Makes me go "crazy"   Xanax Xr [Alprazolam Er] Other (See Comments)    Enhanced depression; feels gloomy   Morphine And Related    Sulfa Antibiotics     Follow-up Information     Armbruster, Carlota Raspberry, MD Follow up in 1 week(s).   Specialty: Gastroenterology Contact information: [address] [place] [address] [phone]         Cyndi Bender, PA-C Follow up in 1 week(s).   Specialty: Physician Assistant Why: Check TSH in 6 to 8 weeks as outpatient Contact information: [address] [place] [address] [phone]                  The results of significant diagnostics from this hospitalization (including imaging, microbiology, ancillary and laboratory) are listed below for reference.    Significant Diagnostic Studies: DG  Abd 1 View  Result Date: 01/03/2021 CLINICAL DATA:  Abdominal pain. EXAM: ABDOMEN - 1 VIEW COMPARISON:  None. FINDINGS: No gaseous bowel dilatation to suggest obstruction. Apparent left-sided surgical feeding catheter. Sacral stimulator device noted. Contrast material in the bladder compatible with CT chest earlier today. IMPRESSION: No evidence of bowel obstruction. Electronically Signed   By: Misty Stanley M.D.   On: 01/03/2021 08:05   CT HEAD WO CONTRAST (5MM)  Result Date: 01/14/2021 CLINICAL DATA:  Mental status change, unknown cause. EXAM: CT HEAD WITHOUT CONTRAST TECHNIQUE: Contiguous axial images were obtained from the base of the skull through the vertex without intravenous contrast. COMPARISON:  CT head without contrast 06/16/2016 FINDINGS: Brain: No acute infarct, hemorrhage, or mass lesion is present. The ventricles are of normal size. No significant extraaxial fluid collection is present.  Vascular: Fluid is present in the sphenoid sinuses bilaterally. Scattered mucosal thickening is present throughout the ethmoid air cells. Minimal fluid is noted in the maxillary sinuses as well. The mastoid air cells are clear. Skull: Calvarium is intact. No focal lytic or blastic lesions are present. No significant extracranial soft tissue lesion is present. Sinuses/Orbits: The paranasal sinuses and mastoid air cells are clear. The globes and orbits are within normal limits. IMPRESSION: 1. Normal CT appearance of the brain. 2. Acute on chronic sinus disease. Electronically Signed   By: San Morelle M.D.   On: 01/14/2021 15:43   CT Angio Chest PE W and/or Wo Contrast  Result Date: 01/03/2021 CLINICAL DATA:  Pulmonary embolism suspected, high probability. Cough, dyspnea, shortness of breath. EXAM: CT ANGIOGRAPHY CHEST WITH CONTRAST TECHNIQUE: Multidetector CT imaging of the chest was performed using the standard protocol during bolus administration of intravenous contrast. Multiplanar CT image reconstructions and MIPs were obtained to evaluate the vascular anatomy. CONTRAST:  80m OMNIPAQUE IOHEXOL 350 MG/ML SOLN COMPARISON:  07/07/2019. FINDINGS: Cardiovascular: The heart is normal in size and there is no pericardial effusion. The aorta and pulmonary trunk are normal in caliber. No pulmonary artery filling defect is identified. Mediastinum/Nodes: Prominent lymph nodes are present in the subcarinal space and right hilum measuring up to 1 cm in short axis diameter. No left hilar adenopathy. No axillary lymphadenopathy bilaterally. Thyroid gland and trachea are within normal limits. There is thickening of the walls of the mid to distal esophagus. Lungs/Pleura: Fibrotic changes are noted in the lungs bilaterally. There are hazy ground-glass airspace opacities bilaterally, greater on the right than on the left. No effusion or pneumothorax. Upper Abdomen: Surgical changes are present in the epigastric region.  There is a small amount of perihepatic free fluid. Musculoskeletal: Old healed rib fractures are noted on the right. Compression fractures are noted at T7, T8, T11, and T12. The fracture at T7 is unchanged from the previous exam. Review of the MIP images confirms the above findings. IMPRESSION: 1. No evidence of pulmonary embolism. 2. Extensive ground-glass opacities in the lungs bilaterally, possible edema or infiltrate. 3. Marked thickening of the walls of the mid to distal esophagus. Endoscopy is recommended for further evaluation. 4. Stable compression deformity at T7. There are compression deformities in the thoracic spine at T8, T11, and T12, indeterminate in age. 5. Small amount of free fluid in the left upper quadrant. Electronically Signed   By: LBrett FairyM.D.   On: 01/03/2021 02:41   DG Fluoro Rm 1-60 Min  Result Date: 01/09/2021 CLINICAL DATA:  Malfunctioning J-tube, replaced 12/27/20, found to be leaking both around the tube and from the J-port  EXAM: Exchange of percutaneous jejunostomy tube under fluoroscopic guidance CONTRAST:  67m OMNIPAQUE IOHEXOL 300 MG/ML  SOLN FLUOROSCOPY TIME:  Fluoroscopy Time: 1.6 minutes Number of Acquired Spot Images: 4 COMPARISON:  Most recent replacement 12/27/20 FINDINGS: The pre-existing jejunostomy tube was injected with contrast material under fluoroscopy and fluoroscopic imaging saved. A wire was inserted through the indwelling tube and tube removed over the wire. The new 70Fr balloon jenunostomy single lumen catheter was trimmed to a similar length. During placement of new tube over the wire, wire access was lost. The 70F j-tube was then placed through the stoma into the jejunum using fluoroscopic guidance without wire assistance. Injection of the new tube demonstrated appropriate position of the tip within the jejunum. The retention ballon was inflated with approximately 10cc sterile water and brought back to the bowel wall. The external bumper was then  brought down to the level of the skin. A clean dressing was placed. The patient tolerated the procedure well without immediate complication. IMPRESSION: Successful exchange of the jejunostomy tube for a new 20 French jejunostomy tube trimmed to a similar length. The tube is ready for immediate use Electronically Signed   By: GAletta EdouardM.D.   On: 01/09/2021 15:20   IR Replc Duoden/Jejuno Tube Percut W/Fluoro  Result Date: 12/27/2020 INDICATION: Malfunctioning jejunostomy tube, leakage around the jejunostomy site EXAM: Exchange of percutaneous jejunostomy tube under fluoroscopic guidance MEDICATIONS: None ANESTHESIA/SEDATION: Local analgesia CONTRAST:  10 mL-administered into the gastric lumen. FLUOROSCOPY TIME:  Fluoroscopy Time: 27 seconds with 2 exposures COMPLICATIONS: None immediate. PROCEDURE: Informed written consent was obtained from the patient after a thorough discussion of the procedural risks, benefits and alternatives. All questions were addressed. Maximal Sterile Barrier Technique was utilized including caps, mask, sterile gowns, sterile gloves, sterile drape, hand hygiene and skin antiseptic. A timeout was performed prior to the initiation of the procedure. The patient was placed supine on the exam table. The mid abdomen was prepped and draped in the standard sterile fashion with inclusion of the existing jejunostomy tube within the sterile field. Under fluoroscopic guidance, the existing jejunostomy tube was injected with contrast material demonstrated appropriate location within the small bowel. The retention balloon was then deflated, and was noted to be partially deflated on presentation. Over a stiff angled Glidewire, the existing jejunostomy catheter was removed, and a new 150French balloon jejunostomy catheter, trimmed to a similar length, was advanced into the jejunum. Injection of the new jejunostomy tube under fluoroscopy demonstrated appropriate position of the tip within the  jejunum. The retention balloon was inflated with approximately 10 mL of sterile water, and brought back to the small bowel wall. The external bumper was brought down to the skin. A clean dressing was placed. The patient tolerated the procedure well without immediate complication. IMPRESSION: Successful exchange of the jejunostomy tube for a new 18 French jejunostomy tube trimmed to a similar length. The existing jejunostomy tube was noted to have had a partially deflated balloon. The new tube is ready for immediate use. Electronically Signed   By: YAlbin FellingM.D.   On: 12/27/2020 13:18   IR Cm Inj Any Colonic Tube W/Fluoro  Result Date: 12/30/2020 INDICATION: Leaking percutaneous jejunostomy tube which was replaced on 12/27/2020. EXAM: JEJUNOSTOMY TUBE INJECTION UNDER FLUOROSCOPY MEDICATIONS: None ANESTHESIA/SEDATION: None CONTRAST:  5 mL Omnipaque 300 diluted with saline and injected via the jejunostomy FLUOROSCOPY TIME:  Fluoroscopy Time: 6 seconds.  1.1 mGy. COMPLICATIONS: None immediate. PROCEDURE: Informed written consent was obtained from the patient  after a thorough discussion of the procedural risks, benefits and alternatives. All questions were addressed. Maximal Sterile Barrier Technique was utilized including caps, mask, sterile gowns, sterile gloves, sterile drape, hand hygiene and skin antiseptic. A timeout was performed prior to the initiation of the procedure. The pre-existing jejunostomy tube was injected with contrast material under fluoroscopy and fluoroscopic imaging saved. The catheter was then flushed with saline. The hub of the catheter was also inspected and the cap replaced with a Christmas tree adapter and Clave cap. FINDINGS: Injection of the jejunostomy shows stable positioning since recent exchange with the tube tip appropriately positioned in the jejunum. The catheter is widely patent with free flow of contrast into the jejunal lumen. Apparent clinical leak of fluid was  occurring at the external capped hub of the catheter. This was physically inspected and showed no crack or physical disruption. The retention balloon is intact. Due to leakage of fluid, the pre-existing attached cap was replaced with a Christmas tree adapter which was then capped with a Clave cap. IMPRESSION: Properly positioned and widely patent jejunostomy catheter. Leakage at the level of the hub was addressed by placement a Christmas tree adapter and Clave cap. Electronically Signed   By: Aletta Edouard M.D.   On: 12/30/2020 14:58   DG Chest Portable 1 View  Result Date: 01/14/2021 CLINICAL DATA:  Provided history: Altered mental status. EXAM: PORTABLE CHEST 1 VIEW COMPARISON:  Chest CT 01/03/2021.  Chest radiograph 01/02/2021. FINDINGS: Heart size within normal limits. Although improved from the prior chest radiograph of 01/02/2021, there is persistent airspace disease within the right mid-to-lower lung field and left lung base. Additionally, there is a 7 mm nodular opacity within the right mid lung, which is new from the prior exam (see annotation on image). No appreciable pleural effusion or pneumothorax. No acute bony abnormality identified. Lower thoracic levocurvature. IMPRESSION: Persistent, although improved, airspace disease within the right mid-to-lower lung field and left lung base. Radiographic follow-up to resolution recommended. New 7 mm nodular opacity within the right mid lung. This is likely infectious/inflammatory in this clinical setting. However, 4-6 week follow-up PA and lateral chest radiographs are recommended. If this finding persists at time of radiographic follow-up, a chest CT is recommended for further characterization. Electronically Signed   By: Kellie Simmering D.O.   On: 01/14/2021 15:18   DG Chest Port 1 View  Result Date: 01/02/2021 CLINICAL DATA:  Cough, dyspnea EXAM: PORTABLE CHEST 1 VIEW COMPARISON:  09/11/2019 FINDINGS: Lung volumes are small, however, pulmonary  insufflation is symmetric and stable since prior examination. Superimposed predominantly mid and lower lung zone interstitial and airspace infiltrate has significantly progressed in the interval since prior examination, likely inflammatory in nature. Noncardiogenic pulmonary edema is considered less likely. No pneumothorax or pleural effusion. Cardiac size within normal limits. Pulmonary vascularity is normal. IMPRESSION: Marked interval progression of mid and lower lung zone predominant pulmonary infiltrate, favored to be inflammatory. This would be better assessed with nonemergent high-resolution CT examination of the chest, if clinically indicated. Electronically Signed   By: Fidela Salisbury M.D.   On: 01/02/2021 23:48   ECHOCARDIOGRAM COMPLETE  Result Date: 01/03/2021    ECHOCARDIOGRAM REPORT   Patient Name:   [name] Date of Exam: 01/03/2021 Medical Rec #:  [id number]    Height: Accession #:    [id number]   Weight: Date of Birth:  Jul 03, 1970     BSA: Patient Age:    17 years     BP:  120/79 mmHg Patient Gender: F            HR:           81 bpm. Exam Location:  Inpatient Procedure: 2D Echo, Cardiac Doppler and Color Doppler Indications:    Dyspnea  History:        Patient has no prior history of Echocardiogram examinations.  Sonographer:    Glo Herring Referring Phys: [place]  1. Left ventricular ejection fraction, by estimation, is 60 to 65%. The left ventricle has normal function. The left ventricle has no regional wall motion abnormalities. Left ventricular diastolic parameters were normal.  2. Right ventricular systolic function is normal. The right ventricular size is normal.  3. The mitral valve is myxomatous. Mild to moderate mitral valve regurgitation. No evidence of mitral stenosis. There is mild late systolic prolapse of both leaflets of the mitral valve.  4. The tricuspid valve is myxomatous. Tricuspid valve regurgitation is mild to moderate.  5. The  aortic valve is normal in structure. Aortic valve regurgitation is not visualized. No aortic stenosis is present.  6. The inferior vena cava is normal in size with greater than 50% respiratory variability, suggesting right atrial pressure of 3 mmHg. FINDINGS  Left Ventricle: Left ventricular ejection fraction, by estimation, is 60 to 65%. The left ventricle has normal function. The left ventricle has no regional wall motion abnormalities. The left ventricular internal cavity size was normal in size. There is  no left ventricular hypertrophy. Left ventricular diastolic parameters were normal. Normal left ventricular filling pressure. Right Ventricle: The right ventricular size is normal. No increase in right ventricular wall thickness. Right ventricular systolic function is normal. Left Atrium: Left atrial size was normal in size. Right Atrium: Right atrial size was normal in size. Pericardium: There is no evidence of pericardial effusion. Mitral Valve: The mitral valve is myxomatous. There is mild late systolic prolapse of both leaflets of the mitral valve. There is mild thickening of the mitral valve leaflet(s). Mild to moderate mitral valve regurgitation, with centrally-directed jet. No  evidence of mitral valve stenosis. Tricuspid Valve: The tricuspid valve is myxomatous. Tricuspid valve regurgitation is mild to moderate. No evidence of tricuspid stenosis. Aortic Valve: The aortic valve is normal in structure. Aortic valve regurgitation is not visualized. No aortic stenosis is present. Aortic valve mean gradient measures 3.0 mmHg. Aortic valve peak gradient measures 6.8 mmHg. Aortic valve area, by VTI measures 1.85 cm. Pulmonic Valve: The pulmonic valve was normal in structure. Pulmonic valve regurgitation is not visualized. No evidence of pulmonic stenosis. Aorta: The aortic root is normal in size and structure. Venous: The inferior vena cava is normal in size with greater than 50% respiratory variability,  suggesting right atrial pressure of 3 mmHg. IAS/Shunts: No atrial level shunt detected by color flow Doppler.  LEFT VENTRICLE PLAX 2D LVIDd:         4.10 cm     Diastology LVIDs:         2.70 cm     LV e' medial:    10.10 cm/s LV PW:         0.80 cm     LV E/e' medial:  8.4 LV IVS:        0.80 cm     LV e' lateral:   11.90 cm/s LVOT diam:     1.70 cm     LV E/e' lateral: 7.2 LV SV:         52  LVOT Area:     2.27 cm  LV Volumes (MOD) LV vol d, MOD A2C: 42.5 ml LV vol d, MOD A4C: 69.4 ml LV vol s, MOD A2C: 16.2 ml LV vol s, MOD A4C: 27.5 ml LV SV MOD A2C:     26.3 ml LV SV MOD A4C:     69.4 ml LV SV MOD BP:      34.8 ml RIGHT VENTRICLE             IVC RV Basal diam:  3.30 cm     IVC diam: 1.90 cm RV Mid diam:    1.60 cm RV S prime:     10.90 cm/s LEFT ATRIUM             RIGHT ATRIUM LA diam:        3.70 cm RA Area:     13.30 cm LA Vol (A2C):   30.1 ml RA Volume:   29.80 ml LA Vol (A4C):   38.3 ml LA Biplane Vol: 37.2 ml  AORTIC VALVE                    PULMONIC VALVE AV Area (Vmax):    1.85 cm     PV Vmax:       0.83 m/s AV Area (Vmean):   1.62 cm     PV Peak grad:  2.8 mmHg AV Area (VTI):     1.85 cm AV Vmax:           130.00 cm/s AV Vmean:          84.800 cm/s AV VTI:            0.279 m AV Peak Grad:      6.8 mmHg AV Mean Grad:      3.0 mmHg LVOT Vmax:         106.00 cm/s LVOT Vmean:        60.700 cm/s LVOT VTI:          0.227 m LVOT/AV VTI ratio: 0.81  AORTA Ao Root diam: 2.90 cm Ao Asc diam:  2.70 cm MITRAL VALVE MV Area (PHT): 5.58 cm    SHUNTS MV Decel Time: 136 msec    Systemic VTI:  0.23 m MV E velocity: 85.30 cm/s  Systemic Diam: 1.70 cm MV A velocity: 71.60 cm/s MV E/A ratio:  1.19 Mihai Croitoru MD Electronically signed by Sanda Klein MD Signature Date/Time: 01/03/2021/1:16:23 PM    Final     Microbiology: No results found for this or any previous visit (from the past 240 hour(s)).   Labs: Basic Metabolic Panel: Recent Labs  Lab 01/14/21 1437 01/15/21 1647  NA 139  --   K 3.2* 3.6  CL  108  --   CO2 22  --   GLUCOSE 120*  --   BUN 9  --   CREATININE 0.59  --   CALCIUM 8.9  --    Liver Function Tests: Recent Labs  Lab 01/14/21 1437  AST 16  ALT 15  ALKPHOS 79  BILITOT 0.6  PROT 6.8  ALBUMIN 3.4*   No results for input(s): LIPASE, AMYLASE in the last 168 hours. No results for input(s): AMMONIA in the last 168 hours. CBC: Recent Labs  Lab 01/14/21 1437  WBC 9.4  NEUTROABS 7.0  HGB 11.9*  HCT 38.0  MCV 86.0  PLT 471*   Cardiac Enzymes: No results for input(s): CKTOTAL, CKMB, CKMBINDEX, TROPONINI in the last 168 hours. BNP:  BNP (last 3 results) Recent Labs    01/03/21 0004  BNP 295.0*    ProBNP (last 3 results) No results for input(s): PROBNP in the last 8760 hours.  CBG: Recent Labs  Lab 01/14/21 1437  GLUCAP 123*       Signed:  Oswald Hillock MD.  [facility] 01/15/2021, 5:45 PM
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
## 2021-01-15 NOTE — Progress Notes (Signed)
Patient arrived on unit.  She was placed in the bed, vitals taken as seen. She stated that she did not want to be her and did not understand why she was being kept.  She wanted to see a physician and go home.  [facility] admits paged to find out MD.  Dr. Darrick Meigs notified.

## 2021-01-16 ENCOUNTER — Telehealth: Payer: Self-pay

## 2021-01-16 ENCOUNTER — Ambulatory Visit: Payer: Medicare Other | Admitting: Primary Care

## 2021-01-16 NOTE — Telephone Encounter (Signed)
Pt is scheduled for appt with Tye Savoy on 01/22/21 at 1:30 pm. Called pt to let her know appt time. Pt verbalized understanding and had no concerns at end of call.

## 2021-01-16 NOTE — Telephone Encounter (Signed)
-----   Message from Yetta Flock, MD sent at 01/16/2021 10:16 AM EST ----- Regarding: Pyrtle outpatient Vaughan Basta this patient needs a follow up with Dr. Hilarie Fredrickson or APP first available post hospital. She was sent from the clinic to hospital the other day for mental status changes which have resolved, but never got to have her clinic appointment. She also needs an EGD but may be best to have her seen in the office first, can you help coordinate? Thanks

## 2021-01-22 ENCOUNTER — Ambulatory Visit (INDEPENDENT_AMBULATORY_CARE_PROVIDER_SITE_OTHER): Payer: Medicare Other | Admitting: Nurse Practitioner

## 2021-01-22 ENCOUNTER — Encounter: Payer: Self-pay | Admitting: Nurse Practitioner

## 2021-01-22 VITALS — BP 94/60 | HR 90 | Ht 59.0 in | Wt <= 1120 oz

## 2021-01-22 DIAGNOSIS — R1319 Other dysphagia: Secondary | ICD-10-CM | POA: Diagnosis not present

## 2021-01-22 DIAGNOSIS — R933 Abnormal findings on diagnostic imaging of other parts of digestive tract: Secondary | ICD-10-CM | POA: Diagnosis not present

## 2021-01-22 DIAGNOSIS — R110 Nausea: Secondary | ICD-10-CM

## 2021-01-22 NOTE — Patient Instructions (Addendum)
If you are age 49 or younger, your body mass index should be between 19-25. Your Body mass index is 14.14 kg/m. If this is out of the aformentioned range listed, please consider follow up with your Primary Care Provider.  ________________________________________________________  The [place] [facility] providers would like to encourage you to use [facility] to communicate with providers for non-urgent requests or questions.  Due to long hold times on the telephone, sending your provider a message by [facility] may be a faster and more efficient way to get a response.  Please allow 48 business hours for a response.  Please remember that this is for non-urgent requests.  _______________________________________________________  [name] have been scheduled for an endoscopy. Please follow written instructions given to you at your visit today. If you use inhalers (even only as needed), please bring them with you on the day of your procedure.  Thank you for entrusting me with your care and choosing [facility].  Tye Savoy, NP-C

## 2021-01-22 NOTE — Progress Notes (Signed)
ASSESSMENT AND PLAN    # 51 yo female with malnutrition / J tube / progressive weight loss in setting of severe post surgical gastroparesis. She has has a history of.severe PUD causing gastric outlet obstruction leading to a partial gastrectomy and vagotomy in 2018.  --Progressive weight loss ( another 7 pounds)  Trying to increase caloric intake by adding some day time J tube feeding. Currently feeding at night.  --Paying out of pocket for Marinol which helps but still with breakthrough nausea making it increasingly difficult to take PO.  Doubt anastomotic stricture, recurrent PUD.   --Will schedule for EGD. The risks and benefits of EGD with possible biopsies were discussed with the patient who agrees to proceed.   --In late November we made a referral to [facility] to discuss surgical options to alleviate need for J-tube ( such as a diverting proximal gastrojejunostomy). Patient tells me she has never heard back from our office nor [facility] about the referral. Will ask CMA Cherylann Parr to contact [facility] about status of referral.   # Intermittent, chronic and non-progressive solid food dysphagia possibly secondary to severe esophagitis as seen on EGD in 2020 and suggested on chest CT December 2022.  --Further evaluation at time of EGD.   # Esophageal wall thickening on chest CT in December 2022 >>> Probably persistent, severe esophagitis in setting of gastroparesis and patulous esophagus.  She has a history of Grade D esophagitis on last endoscopy in Sept 2020.  She is on a PPI now until recently she wasn't taking it.   --Further evaluation at time of EGD --Continue BID PPI   # Colon cancer screening. She is at age for screening but I do not think she could tolerate a bowel prep.   # History of recurrent / chronic aspiration. Suspect aspiration occurs in setting of vomiting. She doesn't have symptoms of pharyngeal dysphagia but rather intermittent esophageal dysphagia. Recently hospitalized with  respiratory failure, treated with antibiotics / steroids. CXR 01/14/21 >> new 7 mm nodular opacity within right lung, likely infectious / inflammatory.     HISTORY OF PRESENT ILLNESS    Chief Complaint : hospital follow up   [name] is a 51 y.o. female known to Dr. Hilarie Fredrickson with a complex past medical history of severe PUD causing gastric outlet obstruction and leading to a partial gastrectomy and bilateral vagotomy in 2018, severe postsurgical gastroparesis, J-tube for malnutrition, erosive esophagitis , chronic constipation, interstitial cystitis , hypothyroidism, depression.  Additional medical history as listed in [place] .   Patient was seen in the office end of November 2022 ( Dr. Hilarie Fredrickson) for follow-up on malnutrition.  Does not like her feeding tube, does not feel it helps maintain her weight.  She was interested in options and Dr. Hilarie Fredrickson referred her to Dr. Redmond Pulling with [facility] surgery to discuss any surgical options such as a diverting proximal gastrojejunostomy.  She and Dr. Hilarie Fredrickson discussed EGD prior to pursuing surgical intervention but patient declined.   We saw her for hospital consultation 01/03/2021.  At that time she was hospitalized with respiratory failure.  We were asked to see her for dysphagia.  Chest CTA had demonstrated marked thickening of the walls of the mid to distal esophagus.  Patient had a known history of severe esophagitis found on EGD in April 2020.  Due to her pulmonary status patient was not felt to be inpatient EGD.  She was started on twice daily PPI and 4 times daily Carafate.  Pulmonary medicine recommended a short course of prednisone and antibiotics , she was given a hospital follow-up to see me on 01/14/2021.   Patient came to see me for hospital follow-up on 01/14/21.  We were unable to carry out the visit due to some acute mental status changes that patient was experiencing.  In clinic she was markedly hypertensive, vital signs were otherwise stable.  Please refer to the 01/14/2021 office note.  Essentially patient was transported from our office via EMS to [facility].   She had a negative evaluation for acute encephalopathy.  Head CT negative. Toxicology screen negative. Mental status changes felt likely to be secondary to steroids versus hypertensive urgency.  We did see her in consultation on 01/15/21.  prior to her being released from the hospital.  She reported ongoing intermittent dysphagia, mainly to solids .  She had been taking Carafate but it turns out she was not compliant with PPI.    INTERVAL HISTORY:  Reigna feels back to normal now. She cannot remember seeing me in clinic nor being transferred to ED. She is off the steroids. She continues to struggle with chronic nausea though Marinol helps. Unfortunately she has to pay out of pocket for it but Father is able to help. She has chronic constipation, recently had two weeks of loose stools but this has resolved and now back to baseline constipation. She takes 5 Dulcolax about every 1.5 to 2 weeks which helps.   Courtni says she never did her from [facility] about an appointment. She would still like to get rid of the J tube. She connects to feeding pump at night for several hours but doesn't feel like it helps maintain her weight. She is now trying to add a can of nutritional supplement during the day by bolus feeding.   PREVIOUS ENDOSCOPIC EVALUATIONS / PERTINENT STUDIES:   EGD 04/2018: - LA Grade D acute and erosive esophagitis. Biopsied. - An antrectomy was found, characterized by visible sutures and no stomal ulceration. Suture material removed. - Scar in the gastric body. - Erythematous mucosa in the gastric body. Biopsied. - Normal examined duodenum.   Esophageal manometry the 03/15/2019: Normal relaxation of the EG junction Absent peristalsis with poor Allen's clearance Will need to exclude scleroderma esophagus or mixed connective tissue disorder She was  referred to rheumatologist Dr. Amil Amen to rule out scleroderma which could contribute to her esophageal symptoms.  She stated seeing Dr. Amil Amen on 05/26/2019 and laboratory studies were negative for scleroderma and no further follow-up was recommended.     pH impedance study 03/15/2019: Elevated esophageal acid exposure in upright position Esophageal acid exposure is abnormal and upright position Overall reflux events slightly elevated, predominantly weak acid reflux No symptom correlates with reflux events based on symptom association probability  CTA chest Dec 2022 Marked thickening of the walls of the mid to distal esophagus. Endoscopy is recommended for further evaluation     Current Medications, Allergies, Past Medical History, Past Surgical History, Family History and Social History were reviewed in [employer] record.     Current Outpatient Medications  Medication Sig Dispense Refill   albuterol (VENTOLIN HFA) 108 (90 Base) MCG/ACT inhaler Inhale 1-2 puffs into the lungs 4 (four) times daily as needed for shortness of breath or wheezing.     amLODipine (NORVASC) 5 MG tablet Take 1 tablet (5 mg total) by mouth daily. 30 tablet 3   Aspirin-Salicylamide-Caffeine (BC HEADACHE PO) Take 1 packet by mouth daily  as needed (pain/headache).      busPIRone (BUSPAR) 15 MG tablet Take 15 mg by mouth 2 (two) times daily.     cyclobenzaprine (FLEXERIL) 5 MG tablet Take 5 mg by mouth 3 (three) times daily as needed for muscle spasms.     dronabinol (MARINOL) 2.5 MG capsule TAKE 2 CAPSULES BY MOUTH IN THE MORNING AND 1 CAP IN THE EVENING (Patient taking differently: Take 2.5 mg by mouth in the morning, at noon, and at bedtime.) 30 capsule 5   gabapentin (NEURONTIN) 800 MG tablet Take 800 mg by mouth 3 (three) times daily.     hydrOXYzine (ATARAX/VISTARIL) 25 MG tablet Take 25-50 mg by mouth at bedtime as needed for sleep.     levothyroxine (SYNTHROID) 25 MCG tablet Take 1  tablet (25 mcg total) by mouth daily before breakfast. 30 tablet 3   naloxone (NARCAN) nasal spray 4 mg/0.1 mL Place 1 spray into the nose once as needed (opioid reversal).     Nutritional Supplements (OSMOLITE 1.5 CAL PO) Give 2.5 Cans by tube at bedtime. Pt states that she infuses this via an infusion pump over 8-10 hours     ondansetron (ZOFRAN-ODT) 4 MG disintegrating tablet TAKE 1 TABLET (4 MG TOTAL) BY MOUTH EVERY 6 (SIX) HOURS AS NEEDED FOR NAUSEA OR VOMITING 60 tablet 1   oxyCODONE-acetaminophen (PERCOCET) 7.5-325 MG tablet Take 1 tablet by mouth 4 (four) times daily.     pantoprazole (PROTONIX) 40 MG tablet Take 1 tablet (40 mg total) by mouth 2 (two) times daily. 30 tablet 3   polyvinyl alcohol (LIQUIFILM TEARS) 1.4 % ophthalmic solution Place 1 drop into both eyes as needed for dry eyes.     promethazine (PHENERGAN) 25 MG tablet Take 1 tablet (25 mg total) by mouth 3 (three) times daily as needed. 30 tablet 1   QUEtiapine (SEROQUEL) 200 MG tablet Take 200 mg by mouth at bedtime.     sucralfate (CARAFATE) 1 GM/10ML suspension Take 10 mLs (1 g total) by mouth 4 (four) times daily -  with meals and at bedtime. 420 mL 1   traZODone (DESYREL) 50 MG tablet Take 100 mg by mouth at bedtime.     No current facility-administered medications for this visit.    Review of Systems: No chest pain. No shortness of breath. No urinary complaints.   PHYSICAL EXAM :    Wt Readings from Last 3 Encounters:  01/22/21 70 lb (31.8 kg)  01/03/21 77 lb (34.9 kg)  12/10/20 77 lb (34.9 kg)    BP 94/60    Pulse 90    Ht 4\' 11"  (1.499 m)    Wt 70 lb (31.8 kg)    BMI 14.14 kg/m  Constitutional:  Very thin female in no acute distress. Psychiatric: Pleasant. Normal mood and affect. Behavior is normal. EENT: Pupils normal.  Conjunctivae are normal. No scleral icterus. Neck supple.  Cardiovascular: Normal rate, regular rhythm. No edema Pulmonary/chest: Effort normal and breath sounds normal. No wheezing,  rales or rhonchi. Abdominal: Soft, nondistended, nontender. Bowel sounds active throughout. There are no masses palpable. No hepatomegaly. Feeding tube covered with clean dressing Neurological: Alert and oriented to person place and time. Skin: Skin is warm and dry. No rashes noted.  Tye Savoy, NP  01/22/2021, 2:08 PM

## 2021-01-23 ENCOUNTER — Other Ambulatory Visit: Payer: Self-pay

## 2021-01-23 ENCOUNTER — Encounter: Payer: Self-pay | Admitting: Primary Care

## 2021-01-23 ENCOUNTER — Ambulatory Visit (INDEPENDENT_AMBULATORY_CARE_PROVIDER_SITE_OTHER): Payer: Medicare Other | Admitting: Primary Care

## 2021-01-23 VITALS — BP 116/62 | HR 82 | Temp 98.4°F | Ht 59.5 in | Wt 70.4 lb

## 2021-01-23 DIAGNOSIS — J449 Chronic obstructive pulmonary disease, unspecified: Secondary | ICD-10-CM

## 2021-01-23 DIAGNOSIS — J849 Interstitial pulmonary disease, unspecified: Secondary | ICD-10-CM

## 2021-01-23 DIAGNOSIS — J9601 Acute respiratory failure with hypoxia: Secondary | ICD-10-CM | POA: Diagnosis not present

## 2021-01-23 DIAGNOSIS — J8410 Pulmonary fibrosis, unspecified: Secondary | ICD-10-CM

## 2021-01-23 DIAGNOSIS — F17200 Nicotine dependence, unspecified, uncomplicated: Secondary | ICD-10-CM

## 2021-01-23 NOTE — Progress Notes (Signed)
@Patient  ID: [name], female    DOB: Jan 18, 1970, 51 y.o.   MRN: [id number]  Chief Complaint  Patient presents with   New Patient (Initial Visit)    SOB/ cough/ wheezes    Referring provider: Jonetta Osgood, MD  HPI: 51 year old female, current everyday smoker. PMH significant for COPD, CAP, acute respiratory failure with hypoxia, GERD, IBS, hypothyroidism, protein-calorie malnutrition, chronic interstitial cystitis. Former patient of Dr. Carlis Abbott, last seen in office on 07/11/19.  Previous [facility] pulmonary encounter: 07/11/19- Dr. Carlis Abbott Mrs. Koffler is a 51 year old woman with a history of recurrent aspirations and vomiting episodes due to severe esophagitis and severe esophageal aperistalsis,  PUD with previous gastric outlet obstruction, s/p gastrectomy and bilateral vagotomy.  She presents for follow-up after obtaining repeat HRCT chest.  She has been referred for J-tube placement after having a repeat episode of waking up at night choking with shortness of breath.  After this episode she developed fever and cough, which was treated by her PCP with albuterol and prednisone, with resolution of her symptoms in a few days.  No changes otherwise since her last visit.   OV 06/22/19: Ms. Laidlaw is a 51 year old woman with a history of severe esophagitis, peptic ulcer disease with gastric outlet obstruction, status post partial gastrectomy, bilateral vagotomy, esophageal aperistalsis who presents for evaluation of an abnormal chest CT scan.  She has followed with GI for the last 5 to 6 years since she began having frequent vomiting and upper GI issues.  Over that time she has lost about 50 pounds from her previous baseline weight of 135 pounds.  She is recommended to get a jejunal feeding tube to address malnutrition given her refractory GI issues.  She has frequent acid reflux despite PPI 3 times a day and Pepcid.  She has been on promotility agents.  She vomits whenever she eats during the day,  so she did avoids eating during the day and eats right before she lays down at night.  About 2 nights per week she wakes up coughing up acid and choking on vomit.  At baseline she has no shortness of breath, sputum production, wheezing.  She has no known rheumatologic history.  She was previously seen by Dr. Amil Amen due to concern for scleroderma, who determined that she does not have a rheumatologic condition.  Serologies were performed during her work-up.  She had a chest x-ray performed in early May by GI after an episode of frequent vomiting.  Due to abnormalities she had a subsequent CT scan a week later.  There is a family history of MI in her mother but no family history of rheumatologic disease.  She is a current every day smoker, 0.5- 1 packs/day x 30 years.  01/23/2021 Patient presents today for hospital follow-up. She has chronic shortness of breath which has worsened over the last 1-2 months. She also has a cough which is non-productive. She has a J-tube d/t esophageal aperistalsis. She receives tube feedings at night feedings, she will also eat some solid food occasionally. 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 Follows with GI. Distribution of her fibrosis and previous negative rheumatologic work up makes ILD associated with autoimmune disease unlikely. She has not needed to use albuterol since being discharged. She has oxygen at home which she has been wearing mainly at night. She is smoking 1/2 pack daily but trying to cut down. She has prescription for nicotine patch but has not started wearing  them yet.    Allergies  Allergen Reactions   Sulfamethoxazole Anaphylaxis   Morphine And Related Other (See Comments)    Delirium. Makes me go "crazy"   Xanax Xr [Alprazolam Er] Other (See Comments)    Enhanced depression; feels gloomy   Morphine And Related    Sulfa Antibiotics      There is no immunization history on file for this patient.  Past Medical  History:  Diagnosis Date   Allergic rhinitis    Anemia    Anxiety    Anxiety disorder    Aspiration into airway    per pt hx on 08/07/19 aspirates food into lungs when asleep    Calcaneus fracture, right 06/19/2017   Chronic interstitial cystitis    Chronic pain 06/19/2017   COPD (chronic obstructive pulmonary disease) (HCC)    Depression    Fibromyalgia    Gastric outlet obstruction    Gastric ulcer    GERD (gastroesophageal reflux disease)    Hiatal hernia    History of kidney stones    Hypothyroidism    in the past   IBS (irritable bowel syndrome)    IDA (iron deficiency anemia)    Internal hemorrhoids    Kidney stones    Myalgia    OCD (obsessive compulsive disorder)    Ulcerative esophagitis    Vitamin D deficiency     Tobacco History: Social History   Tobacco Use  Smoking Status Every Day   Packs/day: 0.50   Types: Cigarettes  Smokeless Tobacco Never  Tobacco Comments   10 cigarettes smoked daily ARJ 01/23/21   Ready to quit: Not Answered Counseling given: Not Answered Tobacco comments: 10 cigarettes smoked daily ARJ 01/23/21   Outpatient Medications Prior to Visit  Medication Sig Dispense Refill   albuterol (VENTOLIN HFA) 108 (90 Base) MCG/ACT inhaler Inhale 1-2 puffs into the lungs 4 (four) times daily as needed for shortness of breath or wheezing.     amLODipine (NORVASC) 5 MG tablet Take 1 tablet (5 mg total) by mouth daily. 30 tablet 3   Aspirin-Salicylamide-Caffeine (BC HEADACHE PO) Take 1 packet by mouth daily as needed (pain/headache).      busPIRone (BUSPAR) 15 MG tablet Take 15 mg by mouth 2 (two) times daily.     cyclobenzaprine (FLEXERIL) 5 MG tablet Take 5 mg by mouth 3 (three) times daily as needed for muscle spasms.     dronabinol (MARINOL) 2.5 MG capsule TAKE 2 CAPSULES BY MOUTH IN THE MORNING AND 1 CAP IN THE EVENING (Patient taking differently: Take 2.5 mg by mouth in the morning, at noon, and at bedtime.) 30 capsule 5   gabapentin (NEURONTIN)  800 MG tablet Take 800 mg by mouth 3 (three) times daily.     hydrOXYzine (ATARAX/VISTARIL) 25 MG tablet Take 25-50 mg by mouth at bedtime as needed for sleep.     levothyroxine (SYNTHROID) 25 MCG tablet Take 1 tablet (25 mcg total) by mouth daily before breakfast. 30 tablet 3   naloxone (NARCAN) nasal spray 4 mg/0.1 mL Place 1 spray into the nose once as needed (opioid reversal).     Nutritional Supplements (OSMOLITE 1.5 CAL PO) Give 2.5 Cans by tube at bedtime. Pt states that she infuses this via an infusion pump over 8-10 hours     ondansetron (ZOFRAN-ODT) 4 MG disintegrating tablet TAKE 1 TABLET (4 MG TOTAL) BY MOUTH EVERY 6 (SIX) HOURS AS NEEDED FOR NAUSEA OR VOMITING 60 tablet 1   oxyCODONE-acetaminophen (PERCOCET) 7.5-325 MG  tablet Take 1 tablet by mouth 4 (four) times daily.     pantoprazole (PROTONIX) 40 MG tablet Take 1 tablet (40 mg total) by mouth 2 (two) times daily. 30 tablet 3   polyvinyl alcohol (LIQUIFILM TEARS) 1.4 % ophthalmic solution Place 1 drop into both eyes as needed for dry eyes.     promethazine (PHENERGAN) 25 MG tablet Take 1 tablet (25 mg total) by mouth 3 (three) times daily as needed. 30 tablet 1   QUEtiapine (SEROQUEL) 200 MG tablet Take 200 mg by mouth at bedtime.     sucralfate (CARAFATE) 1 GM/10ML suspension Take 10 mLs (1 g total) by mouth 4 (four) times daily -  with meals and at bedtime. 420 mL 1   traZODone (DESYREL) 50 MG tablet Take 100 mg by mouth at bedtime.     No facility-administered medications prior to visit.   Review of Systems  Review of Systems  Constitutional: Negative.   HENT: Negative.    Respiratory:  Positive for cough and shortness of breath.     Physical Exam  BP 116/62 (BP Location: Left Arm, Patient Position: Sitting, Cuff Size: Normal)    Pulse 82    Temp 98.4 F (36.9 C) (Oral)    Ht 4' 11.5" (1.511 m)    Wt 70 lb 6.4 oz (31.9 kg)    SpO2 97%    BMI 13.98 kg/m  Physical Exam Constitutional:      General: She is not in acute  distress.    Appearance: Normal appearance. She is not ill-appearing.  HENT:     Head: Normocephalic and atraumatic.     Mouth/Throat:     Mouth: Mucous membranes are moist.     Pharynx: Oropharynx is clear.  Cardiovascular:     Rate and Rhythm: Normal rate and regular rhythm.  Pulmonary:     Effort: Pulmonary effort is normal.     Breath sounds: Normal breath sounds. No wheezing, rhonchi or rales.  Musculoskeletal:        General: Normal range of motion.  Skin:    General: Skin is warm and dry.  Neurological:     General: No focal deficit present.     Mental Status: She is alert and oriented to person, place, and time. Mental status is at baseline.  Psychiatric:        Mood and Affect: Mood normal.        Behavior: Behavior normal.        Thought Content: Thought content normal.        Judgment: Judgment normal.     Lab Results:  CBC    Component Value Date/Time   WBC 9.4 01/14/2021 1437   RBC 4.42 01/14/2021 1437   HGB 11.9 (L) 01/14/2021 1437   HGB 7.4 (L) 09/05/2015 1350   HCT 38.0 01/14/2021 1437   HCT 27.1 (L) 09/05/2015 1350   PLT 471 (H) 01/14/2021 1437   PLT 241 09/05/2015 1350   MCV 86.0 01/14/2021 1437   MCV 71.9 (L) 09/05/2015 1350   MCH 26.9 01/14/2021 1437   MCHC 31.3 01/14/2021 1437   RDW 17.2 (H) 01/14/2021 1437   RDW 20.3 (H) 09/05/2015 1350   LYMPHSABS 1.5 01/14/2021 1437   LYMPHSABS 3.0 09/05/2015 1350   MONOABS 0.7 01/14/2021 1437   MONOABS 0.9 09/05/2015 1350   EOSABS 0.0 01/14/2021 1437   EOSABS 0.2 09/05/2015 1350   BASOSABS 0.0 01/14/2021 1437   BASOSABS 0.0 09/05/2015 1350    BMET  Component Value Date/Time   NA 139 01/14/2021 1437   NA 142 09/05/2015 1350   K 3.6 01/15/2021 1647   K 4.0 09/05/2015 1350   CL 108 01/14/2021 1437   CO2 22 01/14/2021 1437   CO2 22 09/05/2015 1350   GLUCOSE 120 (H) 01/14/2021 1437   GLUCOSE 87 09/05/2015 1350   BUN 9 01/14/2021 1437   BUN 10.1 09/05/2015 1350   CREATININE 0.59 01/14/2021 1437    CREATININE 0.7 09/05/2015 1350   CALCIUM 8.9 01/14/2021 1437   CALCIUM 8.8 06/17/2017 0719   CALCIUM 8.7 09/05/2015 1350   GFRNONAA >60 01/14/2021 1437   GFRAA >60 08/09/2019 1612    BNP    Component Value Date/Time   BNP 295.0 (H) 01/03/2021 0004    ProBNP No results found for: PROBNP  Imaging: CT HEAD WO CONTRAST (5MM)  Result Date: 01/14/2021 CLINICAL DATA:  Mental status change, unknown cause. EXAM: CT HEAD WITHOUT CONTRAST TECHNIQUE: Contiguous axial images were obtained from the base of the skull through the vertex without intravenous contrast. COMPARISON:  CT head without contrast 06/16/2016 FINDINGS: Brain: No acute infarct, hemorrhage, or mass lesion is present. The ventricles are of normal size. No significant extraaxial fluid collection is present. Vascular: Fluid is present in the sphenoid sinuses bilaterally. Scattered mucosal thickening is present throughout the ethmoid air cells. Minimal fluid is noted in the maxillary sinuses as well. The mastoid air cells are clear. Skull: Calvarium is intact. No focal lytic or blastic lesions are present. No significant extracranial soft tissue lesion is present. Sinuses/Orbits: The paranasal sinuses and mastoid air cells are clear. The globes and orbits are within normal limits. IMPRESSION: 1. Normal CT appearance of the brain. 2. Acute on chronic sinus disease. Electronically Signed   By: San Morelle M.D.   On: 01/14/2021 15:43   DG Chest Portable 1 View  Result Date: 01/14/2021 CLINICAL DATA:  Provided history: Altered mental status. EXAM: PORTABLE CHEST 1 VIEW COMPARISON:  Chest CT 01/03/2021.  Chest radiograph 01/02/2021. FINDINGS: Heart size within normal limits. Although improved from the prior chest radiograph of 01/02/2021, there is persistent airspace disease within the right mid-to-lower lung field and left lung base. Additionally, there is a 7 mm nodular opacity within the right mid lung, which is new from the prior  exam (see annotation on image). No appreciable pleural effusion or pneumothorax. No acute bony abnormality identified. Lower thoracic levocurvature. IMPRESSION: Persistent, although improved, airspace disease within the right mid-to-lower lung field and left lung base. Radiographic follow-up to resolution recommended. New 7 mm nodular opacity within the right mid lung. This is likely infectious/inflammatory in this clinical setting. However, 4-6 week follow-up PA and lateral chest radiographs are recommended. If this finding persists at time of radiographic follow-up, a chest CT is recommended for further characterization. Electronically Signed   By: Kellie Simmering D.O.   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 Distribution of her fibrosis and previous negative rheumatologic work up makes ILD associated with autoimmune disease unlikely. Holding off on further steroids d.t hx alternated mental status with use. Aspiration precautions and smoking cessation encouraged.   Acute respiratory failure with hypoxia (HCC) - Continue 2L oxygen with moderate-heavy exertion to maintain O2 >88-90% and at night  Current smoker - Smoking 1/2 pack per day, advised patient start using nicotine patch  COPD (chronic obstructive pulmonary disease) ([place]) -  Needs pulmonary function testing, continue albuterol hfa 2 puffs every 4-6 hours as needed for shortness of breath wheezing. FU 3-4 weeks with Dr. Briant Sites, NP 02/13/2021

## 2021-01-23 NOTE — Patient Instructions (Addendum)
Recommendations: STRONGLY encourage you quit smoking Please follow aspiration precautions Use albuterol 2 puffs every 4-6 hours as needed for shortness of breath Use oxygen 2L with moderate-heavy exertion to maintain O2 >90% and at night Holding off on further steroids d.t altered mental status   Orders: Ambulatory O2 on RA, if< 88% please qualify for POC  Spirometry with DLCO (no BD challenge)  Follow-up: 3-4 weeks with Dr. Meier/ he has openings end of January and early February (30 min PFT prior if able)     Aspiration Precautions, Adult Aspiration is the breathing in (inhalation) of a liquid or other material into the lungs. Adults with neurologicalimpairments, swallowing difficulties (dysphagia), decreased gag reflex, or impaired physical mobility are at risk for aspiration. Things that can be inhaled into the lungs include: Food. Any type of liquid, such as drinks or saliva. Stomach contents, such as vomit or stomach acid. Aspiration can cause pneumonia. You can take steps to reduce the risk of aspiration. What are the signs of aspiration? Signs of aspiration include: Coughing: Coughing after swallowing food or liquids. Coughing up phlegm (sputum) that: Is yellow, tan, or green. Has pieces of food in it. Smells bad. Coughing when lying down or having to sit up quickly after lying down. Having a hoarse, barky cough. Trouble breathing. This may include: Breathing quickly. Breathing very slowly. Loud breathing. Rumbling sounds from the lungs while breathing. Eating troubles, such as: Clearing the throat often while eating. Drooling while eating. A feeling of fullness in the throat or a feeling that something is stuck in the throat. Having a runny nose while eating. Watery eyes while eating. A pained look on the face while eating. Speaking problems such as: Not being able to speak. A hoarse voice. Choking often. A change in skin color. The skin may look red or  blue. Fever. Pain in the chest or back. What are the complications of aspiration? Complications of aspiration include: Losing weight because the person is not absorbing needed nutrients. Loss of enjoyment and the social benefits of eating. Choking. Lung irritation, if someone aspirates acidic food or drinks. Lung infection (pneumonia). Lung abscess, which is a collection of infected liquid (pus) in the lungs. In serious cases, death can occur. What can I do to prevent aspiration? Caring for someone who has a feeding tube If you are caring for someone who has a feeding tube and cannot eat or drink safely through his or her mouth: Keep the person in an upright position as much as possible. Do not lay the person flat if he or she is getting continuous feedings. If you need to lay the person flat for any reason, turn the feeding pump off. Check feeding tube residuals as told by the health care provider. Ask the health care provider what residual amount is too high. Caring for someone who can eat and drink safely by mouth If you are caring for someone who can eat and drink safely by mouth: Have the person sit in an upright position when eating food or drinking fluids. This can be done in two ways: Have the person sit up in a chair. If sitting in a chair is not possible, position the person in bed so he or she is upright. Remind the person to eat slowly and chew well. Make sure the person is awake and alert while eating. Never put food or liquids in the mouth of a person who is not fully alert. Do not distract the person. This is especially important  for people with thinking or memory (cognitive) problems. Allow foods to cool. Hot foods may be more difficult to swallow. Provide small meals more frequently instead of three large meals. This may reduce fatigue during eating. After the person has finishing eating: Check the person's mouth thoroughly for leftover food. Keep the person sitting  upright for 30-45 minutes. Do not serve food or drink within 2 hours of bedtime. General instructions Follow these general guidelines to prevent aspiration in someone who can eat and drink safely by mouth: Feed small amounts of food. Do not force-feed. For a person who is on a diet for swallowing difficulty (dysphagia diet), follow the recommended food and drink consistency. For example, you may be told to thicken a liquid using a commercial food thickening product. Use as little water as possible when brushing the person's teeth or cleaning his or her mouth. Provide oral care before and after meals. Use adaptive devices, such as cut-out cups or other utensils, as told by the health care provider. Crush pills and put them in soft food such as pudding or ice cream. Some pills should not be crushed. Check with the health care provider before crushing any medicine. If someone is choking on food or an object, perform the Heimlich maneuver (abdominal thrusts).  Contact a health care provider if the person: Has a feeding tube, and the feeding tube residual amount is too high. Has a fever. Tries to avoid food or water, such as refusing to eat, drink, or be fed, or is eating less than normal. May have aspirated food or liquid. Is showing warning signs, such as choking or coughing, when he or she eats or drinks. Get help right away if the person: Has trouble breathing or starts to breathe quickly. Is breathing very slowly or stops breathing. Coughs a lot after eating or drinking. Has a long-lasting (chronic) cough. Coughs up thick, yellow, or tan phlegm. Has symptoms of pneumonia, such as: Coughing a lot. Coughing up mucus with a bad smell or blood in it. Feeling short of breath. Complaining of chest pain. Sweating, fever, and chills. Feeling tired. Complaining of trouble breathing. Wheezing. Cannot stop choking. Is unable to breathe, turns blue, faints, or seems confused. These symptoms  may represent a serious problem that is an emergency. Do not wait to see if the symptoms will go away. Get medical help right away. Call your local emergency services (911 in the [place]  Summary Aspiration is the breathing in (inhalation) of a liquid or material into the lungs. Things that can be inhaled into the lungs include food, liquids, saliva, or stomach contents. Aspiration can cause pneumonia or choking. One sign of aspiration is coughing after swallowing food or liquids. Contact your health care provider if you notice signs of aspiration. This information is not intended to replace advice given to you by your health care provider. Make sure you discuss any questions you have with your health care provider. Document Revised: 09/05/2019 Document Reviewed: 01/02/2019 Elsevier Patient Education  2022 [employer].

## 2021-02-03 NOTE — Progress Notes (Signed)
Addendum: Reviewed and agree with assessment and management plan. I do think she should meet with Dr. Hassell Done again to see if there are any surgical options to help her absent gastric emptying.  Her esophageal stasis is felt secondary to severe gastroparesis and associated reflux after prior antrectomy and vagotomy [name], Lajuan Lines, MD

## 2021-02-07 ENCOUNTER — Telehealth: Payer: Self-pay | Admitting: Nurse Practitioner

## 2021-02-07 NOTE — Telephone Encounter (Signed)
Patient called requesting to speak with a nurse regarding the Marinol medication.

## 2021-02-07 NOTE — Telephone Encounter (Signed)
Spoke to [place] about the Marinol. She has tried and failed traditional antiemetic medications. She states that the Marinol is working, however she is paying $45 for a 10 day supply. She would like to see if there is any alternatives. I advised her to call her insurance company and find out if there would be any benefit to try mail-order pharmacies, as this tends to be more cost effective. She indicates that she does not want to stop this medication if there is no alternative.

## 2021-02-10 NOTE — Telephone Encounter (Signed)
Pt has been notified and aware.  

## 2021-02-10 NOTE — Telephone Encounter (Signed)
We have tried alternatives which were not as effective We can provide refill JMP

## 2021-02-13 ENCOUNTER — Telehealth: Payer: Self-pay

## 2021-02-13 DIAGNOSIS — J8410 Pulmonary fibrosis, unspecified: Secondary | ICD-10-CM | POA: Insufficient documentation

## 2021-02-13 NOTE — Assessment & Plan Note (Addendum)
-   Needs pulmonary function testing, continue albuterol hfa 2 puffs every 4-6 hours as needed for shortness of breath wheezing. FU 3-4 weeks with Dr. Verlee Monte

## 2021-02-13 NOTE — Telephone Encounter (Signed)
Received phone call from [facility] on 02/12/21 stating pt called their office frustrated that she still hasn't heard from them. [facility] wanted to know when referral was sent. Let [facility] know that I spoke with Lilia Pro from [facility] on 12/26/20 and was told that surgeons were reviewing pt's case and that pt would be contacted soon about whether they will do surgery or if pt will be referred somewhere else. [facility] stated that Lilia Pro no longer works there. Dr. Hilarie Fredrickson wanted surgical opinion on 12/10/20 office note.  [facility] stated that message would be given to manager and they would contact pt.

## 2021-02-13 NOTE — Assessment & Plan Note (Addendum)
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 Distribution of her fibrosis and previous negative rheumatologic work up makes ILD associated with autoimmune disease unlikely. Holding off on further steroids d.t hx alternated mental status with use. Aspiration precautions and smoking cessation encouraged.

## 2021-02-13 NOTE — Assessment & Plan Note (Addendum)
-   Smoking 1/2 pack per day, advised patient start using nicotine patch

## 2021-02-13 NOTE — Assessment & Plan Note (Addendum)
-   Continue 2L oxygen with moderate-heavy exertion to maintain O2 >88-90% and at night

## 2021-02-14 ENCOUNTER — Encounter: Payer: Medicare Other | Admitting: Internal Medicine

## 2021-02-24 ENCOUNTER — Other Ambulatory Visit: Payer: Self-pay | Admitting: Internal Medicine

## 2021-02-25 ENCOUNTER — Other Ambulatory Visit: Payer: Self-pay

## 2021-02-25 ENCOUNTER — Telehealth: Payer: Self-pay

## 2021-02-25 MED ORDER — DRONABINOL 2.5 MG PO CAPS: ORAL_CAPSULE | ORAL | 0 refills | Status: DC

## 2021-02-25 NOTE — Telephone Encounter (Signed)
yes

## 2021-02-25 NOTE — Telephone Encounter (Signed)
Received a call from [facility] stating that only a 10 day supply of marinol was sent in yesterday for pt. Can we send in 90 capsules for a 1 month supply?

## 2021-02-25 NOTE — Telephone Encounter (Signed)
Prescription sent to pharmacy.

## 2021-02-25 NOTE — Telephone Encounter (Signed)
Pharmacy stated they did not receive prescription. Faxed prescription to [phone]

## 2021-02-26 NOTE — Progress Notes (Signed)
Synopsis: Referred for abnormal CT Chest by Cyndi Bender, PA-C  Subjective:   PATIENT ID: [name] GENDER: female DOB: 1970-07-11, MRN: [id number]  Chief Complaint  Patient presents with   Follow-up    Before and after spiro done today. She is c/o chest tightness, increased SOB and wheezing x 2 days. She does not have much of a cough or sputum production. She states that occ the discomfort/tightness in her chest will radiate to her left arm.    51yF with smoking, emphysema and fibrotic lung disease which may be due to DIP vs recurrent aspiration followed previously by Dr. Carlis Abbott  She says she's ok overall but she does note chest tightness over last couple days, some in left arm - a couple days ago which has self-resolved - this occurred at the end of a long day working at [employer] but started when she was at rest and was relieved with rest. Not much of a cough.   She still wakes at night frequently with sensation of something in her throat. Needed to get up to vomit last night after she tried to eat some solid food.   She says she doesn't walk enough to really recognize if/when she gets onset of DOE.   Still smoking, down to half ppd. Chantix 'made me crazy.'  Otherwise pertinent review of systems is negative.  Past Medical History:  Diagnosis Date   Allergic rhinitis    Anemia    Anxiety    Anxiety disorder    Aspiration into airway    per pt hx on 08/07/19 aspirates food into lungs when asleep    Calcaneus fracture, right 06/19/2017   Chronic interstitial cystitis    Chronic pain 06/19/2017   COPD (chronic obstructive pulmonary disease) (HCC)    Depression    Fibromyalgia    Gastric outlet obstruction    Gastric ulcer    GERD (gastroesophageal reflux disease)    Hiatal hernia    History of kidney stones    Hypothyroidism    in the past   IBS (irritable bowel syndrome)    IDA (iron deficiency anemia)    Internal hemorrhoids    Kidney stones    Myalgia    OCD  (obsessive compulsive disorder)    Ulcerative esophagitis    Vitamin D deficiency      Family History  Problem Relation Age of Onset   Clotting disorder Mother    Crohn's disease Mother    Heart disease Mother    Emphysema Mother    Heart disease Father    Breast cancer Maternal Aunt    Colon cancer Neg Hx    Stomach cancer Neg Hx    Rectal cancer Neg Hx    Esophageal cancer Neg Hx      Past Surgical History:  Procedure Laterality Date   36 HOUR [place] STUDY N/A 03/15/2019   Procedure: 24 HOUR [place] STUDY;  Surgeon: Jerene Bears, MD;  Location: [facility];  Service: Gastroenterology;  Laterality: N/A;   ABDOMINAL HYSTERECTOMY     BILROTH I PROCEDURE  06/22/2016   Dr Pauletta Browns---- due to gastric ulcer   ESOPHAGEAL MANOMETRY N/A 03/15/2019   Procedure: ESOPHAGEAL MANOMETRY (EM);  Surgeon: Jerene Bears, MD;  Location: [facility];  Service: Gastroenterology;  Laterality: N/A;   EXTERNAL EAR SURGERY     dont know specific ear sugery (inner/external)   FRACTURE SURGERY     right arm   gastrectomy and bilateral vagotomy  IR CM INJ ANY COLONIC TUBE W/FLUORO  12/30/2020   IR REPLC DUODEN/JEJUNO TUBE PERCUT W/FLUORO  09/15/2019   IR REPLC DUODEN/JEJUNO TUBE PERCUT W/FLUORO  07/23/2020   IR REPLC DUODEN/JEJUNO TUBE PERCUT W/FLUORO  09/23/2020   IR REPLC DUODEN/JEJUNO TUBE PERCUT W/FLUORO  12/27/2020   IR SINUS/FIST TUBE CHK-NON GI  03/14/2020   LAPAROSCOPIC JEJUNOSTOMY N/A 08/09/2019   Procedure: LAPAROSCOPIC FEEDING JEJUNOSTOMY;  Surgeon: Johnathan Hausen, MD;  Location: [facility];  Service: General;  Laterality: N/A;   mutliple surgeries due to intertitial cystitis     bladder stimulator   ORIF TIBIA FRACTURE Right 06/17/2017   Procedure: OPEN REDUCTION INTERNAL FIXATION (ORIF) DISTAL TIBIA FRACTURE AND CALCANEOUS;  Surgeon: Altamese [place], MD;  Location: [facility];  Service: Orthopedics;  Laterality: Right;   OTHER SURGICAL HISTORY     TENS implantation for Chronic Interstitial Cystitis   PARTIAL  GASTRECTOMY      Social History   Socioeconomic History   Marital status: Divorced    Spouse name: Not on file   Number of children: 1   Years of education: 16   Highest education level: Not on file  Occupational History   Occupation: Tourist information centre manager: [employer]  Tobacco Use   Smoking status: Every Day    Packs/day: 0.50    Types: Cigarettes   Smokeless tobacco: Never   Tobacco comments:    10 cigarettes smoked daily ARJ 01/23/21  Vaping Use   Vaping Use: Never used  Substance and Sexual Activity   Alcohol use: Not Currently   Drug use: Never   Sexual activity: Not on file  Other Topics Concern   Not on file  Social History Narrative   ** Merged History Encounter **       Social Determinants of Health   Financial Resource Strain: Not on file  Food Insecurity: Not on file  Transportation Needs: Not on file  Physical Activity: Not on file  Stress: Not on file  Social Connections: Not on file  Intimate Partner Violence: Not on file     Allergies  Allergen Reactions   Sulfamethoxazole Anaphylaxis   Morphine And Related Other (See Comments)    Delirium. Makes me go "crazy"   Xanax Xr [Alprazolam Er] Other (See Comments)    Enhanced depression; feels gloomy   Morphine And Related    Sulfa Antibiotics      Outpatient Medications Prior to Visit  Medication Sig Dispense Refill   albuterol (VENTOLIN HFA) 108 (90 Base) MCG/ACT inhaler Inhale 1-2 puffs into the lungs 4 (four) times daily as needed for shortness of breath or wheezing.     amLODipine (NORVASC) 5 MG tablet Take 1 tablet (5 mg total) by mouth daily. 30 tablet 3   Aspirin-Salicylamide-Caffeine (BC HEADACHE PO) Take 1 packet by mouth daily as needed (pain/headache).      busPIRone (BUSPAR) 15 MG tablet Take 15 mg by mouth 2 (two) times daily.     cyclobenzaprine (FLEXERIL) 5 MG tablet Take 5 mg by mouth 3 (three) times daily as needed for muscle spasms.     dronabinol (MARINOL) 2.5 MG capsule TAKE TWO  CAPSULES BY MOUTH EVERY MORNING AND 1 CAPSULE EVERY EVENING 90 capsule 0   gabapentin (NEURONTIN) 800 MG tablet Take 800 mg by mouth 3 (three) times daily.     hydrOXYzine (ATARAX/VISTARIL) 25 MG tablet Take 25-50 mg by mouth at bedtime as needed for sleep.     levothyroxine (SYNTHROID) 25 MCG tablet  Take 1 tablet (25 mcg total) by mouth daily before breakfast. 30 tablet 3   naloxone (NARCAN) nasal spray 4 mg/0.1 mL Place 1 spray into the nose once as needed (opioid reversal).     Nutritional Supplements (OSMOLITE 1.5 CAL PO) Give 2.5 Cans by tube at bedtime. Pt states that she infuses this via an infusion pump over 8-10 hours     ondansetron (ZOFRAN-ODT) 4 MG disintegrating tablet TAKE 1 TABLET (4 MG TOTAL) BY MOUTH EVERY 6 (SIX) HOURS AS NEEDED FOR NAUSEA OR VOMITING 60 tablet 1   oxyCODONE-acetaminophen (PERCOCET) 7.5-325 MG tablet Take 1 tablet by mouth 4 (four) times daily.     pantoprazole (PROTONIX) 40 MG tablet Take 1 tablet (40 mg total) by mouth 2 (two) times daily. 30 tablet 3   polyvinyl alcohol (LIQUIFILM TEARS) 1.4 % ophthalmic solution Place 1 drop into both eyes as needed for dry eyes.     promethazine (PHENERGAN) 25 MG tablet Take 1 tablet (25 mg total) by mouth 3 (three) times daily as needed. 30 tablet 1   QUEtiapine (SEROQUEL) 200 MG tablet Take 200 mg by mouth at bedtime.     sucralfate (CARAFATE) 1 GM/10ML suspension Take 10 mLs (1 g total) by mouth 4 (four) times daily -  with meals and at bedtime. 420 mL 1   traZODone (DESYREL) 50 MG tablet Take 100 mg by mouth at bedtime.     No facility-administered medications prior to visit.       Objective:   Physical Exam:  General appearance: 51 y.o., female, NAD, frail, thin Eyes: anicteric sclerae; PERRL, tracking appropriately HENT: NCAT; MMM Neck: Trachea midline; no lymphadenopathy, no JVD Lungs: bronchial breath sounds bl, no crackles, no wheeze, with normal respiratory effort CV: RRR, no murmur  Abdomen: Soft,  non-tender; non-distended, BS present  Extremities: No peripheral edema, warm Skin: Normal turgor and texture; no rash Psych: Appropriate affect Neuro: Alert and oriented to person and place, no focal deficit     Vitals:   02/27/21 1207  BP: 96/62  Pulse: 75  Temp: 98 F (36.7 C)  TempSrc: Oral  SpO2: 98%  Weight: 74 lb (33.6 kg)  Height: 4' 11.5" (1.511 m)   98% on RA BMI Readings from Last 3 Encounters:  02/27/21 14.70 kg/m  01/23/21 13.98 kg/m  01/22/21 14.14 kg/m   Wt Readings from Last 3 Encounters:  02/27/21 74 lb (33.6 kg)  01/23/21 70 lb 6.4 oz (31.9 kg)  01/22/21 70 lb (31.8 kg)     CBC    Component Value Date/Time   WBC 9.4 01/14/2021 1437   RBC 4.42 01/14/2021 1437   HGB 11.9 (L) 01/14/2021 1437   HGB 7.4 (L) 09/05/2015 1350   HCT 38.0 01/14/2021 1437   HCT 27.1 (L) 09/05/2015 1350   PLT 471 (H) 01/14/2021 1437   PLT 241 09/05/2015 1350   MCV 86.0 01/14/2021 1437   MCV 71.9 (L) 09/05/2015 1350   MCH 26.9 01/14/2021 1437   MCHC 31.3 01/14/2021 1437   RDW 17.2 (H) 01/14/2021 1437   RDW 20.3 (H) 09/05/2015 1350   LYMPHSABS 1.5 01/14/2021 1437   LYMPHSABS 3.0 09/05/2015 1350   MONOABS 0.7 01/14/2021 1437   MONOABS 0.9 09/05/2015 1350   EOSABS 0.0 01/14/2021 1437   EOSABS 0.2 09/05/2015 1350   BASOSABS 0.0 01/14/2021 1437   BASOSABS 0.0 09/05/2015 1350     Chest Imaging: CTA Chest 12/28 reviewed by me remarkable for emphysema and widespread ggo, mediastinal LAD and  patulous esophagus  CXR reviewed by me today with  3.6cm rounded opacity left mid lung field, reticular opacities on right.  Pulmonary Functions Testing Results: PFT Results Latest Ref Rng & Units 02/27/2021  FVC-Pre L 1.91  FVC-Predicted Pre % 63  Pre FEV1/FVC % % 80  FEV1-Pre L 1.54  FEV1-Predicted Pre % 63  DLCO uncorrected ml/min/mmHg 8.02  DLCO UNC% % 44  DLCO corrected ml/min/mmHg 8.44  DLCO COR %Predicted % 46  DLVA Predicted % 59      Echocardiogram:   TTE  01/03/21 mild mod MR, TR      Assessment & Plan:   # ILD: May be multifactorial due to recurrent aspiration, possibly with superimposed DIP due to smoking.  # Left lung mass-like consolidation:  Plan: - awaiting final read on CXR and while this would be rapid for development of malignancy, we may need to pursue CT Chest now - spirometry before next visit - counseled extensively on smoking cessation, especially given possibility of DIP. Prescribed gum for her to use with patches. Declines chantix due to mood disturbance in past.  - prevnar 20 today   RTC 3 months  Maryjane Hurter, MD [facility] 02/27/2021 12:21 PM

## 2021-02-27 ENCOUNTER — Other Ambulatory Visit: Payer: Self-pay

## 2021-02-27 ENCOUNTER — Encounter: Payer: Self-pay | Admitting: Student

## 2021-02-27 ENCOUNTER — Ambulatory Visit: Payer: Medicare Other | Admitting: Student

## 2021-02-27 ENCOUNTER — Ambulatory Visit (INDEPENDENT_AMBULATORY_CARE_PROVIDER_SITE_OTHER): Payer: Medicare Other | Admitting: Student

## 2021-02-27 ENCOUNTER — Telehealth: Payer: Self-pay | Admitting: Student

## 2021-02-27 ENCOUNTER — Ambulatory Visit (INDEPENDENT_AMBULATORY_CARE_PROVIDER_SITE_OTHER): Payer: Medicare Other

## 2021-02-27 VITALS — BP 96/62 | HR 75 | Temp 98.0°F | Ht 59.5 in | Wt 74.0 lb

## 2021-02-27 DIAGNOSIS — J849 Interstitial pulmonary disease, unspecified: Secondary | ICD-10-CM | POA: Diagnosis not present

## 2021-02-27 DIAGNOSIS — R9389 Abnormal findings on diagnostic imaging of other specified body structures: Secondary | ICD-10-CM

## 2021-02-27 DIAGNOSIS — R0609 Other forms of dyspnea: Secondary | ICD-10-CM

## 2021-02-27 DIAGNOSIS — Z23 Encounter for immunization: Secondary | ICD-10-CM | POA: Diagnosis not present

## 2021-02-27 DIAGNOSIS — F17210 Nicotine dependence, cigarettes, uncomplicated: Secondary | ICD-10-CM

## 2021-02-27 LAB — PULMONARY FUNCTION TEST
DL/VA % pred: 59 %
DL/VA: 2.67 ml/min/mmHg/L
DLCO cor % pred: 46 %
DLCO cor: 8.44 ml/min/mmHg
DLCO unc % pred: 44 %
DLCO unc: 8.02 ml/min/mmHg
FEF 25-75 Pre: 1.52 L/sec
FEF2575-%Pred-Pre: 59 %
FEV1-%Pred-Pre: 63 %
FEV1-Pre: 1.54 L
FEV1FVC-%Pred-Pre: 100 %
FEV6-%Pred-Pre: 64 %
FEV6-Pre: 1.89 L
FEV6FVC-%Pred-Pre: 102 %
FVC-%Pred-Pre: 63 %
FVC-Pre: 1.91 L
Pre FEV1/FVC ratio: 80 %
Pre FEV6/FVC Ratio: 100 %

## 2021-02-27 MED ORDER — NICOTINE POLACRILEX 2 MG MT GUM: 2.0000 mg | CHEWING_GUM | OROMUCOSAL | 0 refills | Status: DC | PRN

## 2021-02-27 NOTE — Telephone Encounter (Signed)
Called patient and she stated that she was unsure if she should keep the oxygen tanks that she was sent home with from the hospital! She doesn't have a pulse ox to keep track of her oxygen levels, I advised her to get one to keep track of her oxygen levels.  She also wants to know her life expectancy if she does not stop smoking. She states she has been smoking for 30 years and is at 1/2 ppd.  Oh and she also wants to know her chest xray results.  Dr Verlee Monte please advise

## 2021-02-27 NOTE — Patient Instructions (Addendum)
-   Spirometry (lung function) next visit - try patches plus gum to help you stop smoking. Concerned that you may have a smoking related interstitial lung disease called DIP: desquamative interstitial pneumonia - see you in 3 months!

## 2021-02-27 NOTE — Telephone Encounter (Signed)
Called and discussed cxr - I have ordered ct chest for rounded left lung opacity. Encouraged smoking cessation again. We'll check walking oximetry next visit.

## 2021-02-27 NOTE — Telephone Encounter (Signed)
Patient is returning phone call. Patient phone number is [phone].

## 2021-02-27 NOTE — Telephone Encounter (Signed)
Lm for patient on home number. ATC mobile number--unable to leave vm due to mailbox not being setup.  Will call back.

## 2021-02-27 NOTE — Patient Instructions (Signed)
Spirometry/DLCO performed today. 

## 2021-02-27 NOTE — Progress Notes (Signed)
Spirometry/DLCO performed today. 

## 2021-02-28 ENCOUNTER — Telehealth: Payer: Self-pay | Admitting: Student

## 2021-02-28 NOTE — Telephone Encounter (Signed)
Called patient but she did not answer. Left message for her to call back.  

## 2021-02-28 NOTE — Progress Notes (Signed)
You saw patient yesterday, ordered CT chest

## 2021-03-03 ENCOUNTER — Telehealth: Payer: Self-pay | Admitting: Student

## 2021-03-03 ENCOUNTER — Ambulatory Visit (INDEPENDENT_AMBULATORY_CARE_PROVIDER_SITE_OTHER)
Admission: RE | Admit: 2021-03-03 | Discharge: 2021-03-03 | Disposition: A | Discharge status: Home / Self Care | Payer: Medicare Other | Source: Ambulatory Visit | Attending: Student | Admitting: Student

## 2021-03-03 ENCOUNTER — Other Ambulatory Visit: Payer: Self-pay

## 2021-03-03 DIAGNOSIS — R9389 Abnormal findings on diagnostic imaging of other specified body structures: Secondary | ICD-10-CM | POA: Diagnosis not present

## 2021-03-04 NOTE — Telephone Encounter (Signed)
Spoke to patient, who is requesting CT results from 03/03/2021. She is aware that Dr. Verlee Monte will not be available until 03/05/2021.  Dr. Verlee Monte, please advise. Thanks

## 2021-03-11 ENCOUNTER — Telehealth: Payer: Self-pay | Admitting: Student

## 2021-03-11 NOTE — Telephone Encounter (Signed)
Spoke to patient.  She is requesting CT results from 03/03/21.  Dr. Verlee Monte, please advise. Thanks

## 2021-03-11 NOTE — Telephone Encounter (Signed)
Mychart message sent.

## 2021-03-11 NOTE — Telephone Encounter (Signed)
See separate telephone encounter.

## 2021-05-28 NOTE — Progress Notes (Deleted)
Synopsis: Referred for abnormal CT Chest by Cyndi Bender, PA-C  Subjective:   PATIENT ID: [name] GENDER: female DOB: 1970-08-02, MRN: [id number]  No chief complaint on file.  51yF with smoking, emphysema and fibrotic lung disease which may be due to DIP vs recurrent aspiration followed previously by Dr. Carlis Abbott  She says she's ok overall but she does note chest tightness over last couple days, some in left arm - a couple days ago which has self-resolved - this occurred at the end of a long day working at [employer] but started when she was at rest and was relieved with rest. Not much of a cough.   She still wakes at night frequently with sensation of something in her throat. Needed to get up to vomit last night after she tried to eat some solid food.   She says she doesn't walk enough to really recognize if/when she gets onset of DOE.   Still smoking, down to half ppd. Chantix 'made me crazy.'  Interval HPI: Working on smoking cessation at last visit. CT Chest showed some improvement in widespread ggo.  Otherwise pertinent review of systems is negative. Past Medical History:  Diagnosis Date   Allergic rhinitis    Anemia    Anxiety    Anxiety disorder    Aspiration into airway    per pt hx on 08/07/19 aspirates food into lungs when asleep    Calcaneus fracture, right 06/19/2017   Chronic interstitial cystitis    Chronic pain 06/19/2017   COPD (chronic obstructive pulmonary disease) (HCC)    Depression    Fibromyalgia    Gastric outlet obstruction    Gastric ulcer    GERD (gastroesophageal reflux disease)    Hiatal hernia    History of kidney stones    Hypothyroidism    in the past   IBS (irritable bowel syndrome)    IDA (iron deficiency anemia)    Internal hemorrhoids    Kidney stones    Myalgia    OCD (obsessive compulsive disorder)    Ulcerative esophagitis    Vitamin D deficiency      Family History  Problem Relation Age of Onset   Clotting disorder Mother     Crohn's disease Mother    Heart disease Mother    Emphysema Mother    Heart disease Father    Breast cancer Maternal Aunt    Colon cancer Neg Hx    Stomach cancer Neg Hx    Rectal cancer Neg Hx    Esophageal cancer Neg Hx      Past Surgical History:  Procedure Laterality Date   48 HOUR [place] STUDY N/A 03/15/2019   Procedure: 24 HOUR [place] STUDY;  Surgeon: Jerene Bears, MD;  Location: [facility];  Service: Gastroenterology;  Laterality: N/A;   ABDOMINAL HYSTERECTOMY     BILROTH I PROCEDURE  06/22/2016   Dr Pauletta Browns---- due to gastric ulcer   ESOPHAGEAL MANOMETRY N/A 03/15/2019   Procedure: ESOPHAGEAL MANOMETRY (EM);  Surgeon: Jerene Bears, MD;  Location[facility];  Service: Gastroenterology;  Laterality: N/A;   EXTERNAL EAR SURGERY     dont know specific ear sugery (inner/external)   FRACTURE SURGERY     right arm   gastrectomy and bilateral vagotomy      IR CM INJ ANY COLONIC TUBE W/FLUORO  12/30/2020   IR REPLC DUODEN/JEJUNO TUBE PERCUT W/FLUORO  09/15/2019   IR REPLC DUODEN/JEJUNO TUBE PERCUT W/FLUORO  07/23/2020   IR REPLC  DUODEN/JEJUNO TUBE PERCUT W/FLUORO  09/23/2020   IR REPLC DUODEN/JEJUNO TUBE PERCUT W/FLUORO  12/27/2020   IR SINUS/FIST TUBE CHK-NON GI  03/14/2020   LAPAROSCOPIC JEJUNOSTOMY N/A 08/09/2019   Procedure: LAPAROSCOPIC FEEDING JEJUNOSTOMY;  Surgeon: Johnathan Hausen, MD;  Location: [facility];  Service: General;  Laterality: N/A;   mutliple surgeries due to intertitial cystitis     bladder stimulator   ORIF TIBIA FRACTURE Right 06/17/2017   Procedure: OPEN REDUCTION INTERNAL FIXATION (ORIF) DISTAL TIBIA FRACTURE AND CALCANEOUS;  Surgeon: Altamese Weston, MD;  Location: [place];  Service: Orthopedics;  Laterality: Right;   OTHER SURGICAL HISTORY     TENS implantation for Chronic Interstitial Cystitis   PARTIAL GASTRECTOMY      Social History   Socioeconomic History   Marital status: Divorced    Spouse name: Not on file   Number of children: 1   Years of education: 16    Highest education level: Not on file  Occupational History   Occupation: Tourist information centre manager: [employer]  Tobacco Use   Smoking status: Every Day    Packs/day: 0.50    Types: Cigarettes   Smokeless tobacco: Never   Tobacco comments:    10 cigarettes smoked daily ARJ 01/23/21  Vaping Use   Vaping Use: Never used  Substance and Sexual Activity   Alcohol use: Not Currently   Drug use: Never   Sexual activity: Not on file  Other Topics Concern   Not on file  Social History Narrative   ** Merged History Encounter **       Social Determinants of Health   Financial Resource Strain: Not on file  Food Insecurity: Not on file  Transportation Needs: Not on file  Physical Activity: Not on file  Stress: Not on file  Social Connections: Not on file  Intimate Partner Violence: Not on file     Allergies  Allergen Reactions   Sulfamethoxazole Anaphylaxis   Morphine And Related Other (See Comments)    Delirium. Makes me go "crazy"   Xanax Xr [Alprazolam Er] Other (See Comments)    Enhanced depression; feels gloomy   Morphine And Related    Sulfa Antibiotics      Outpatient Medications Prior to Visit  Medication Sig Dispense Refill   albuterol (VENTOLIN HFA) 108 (90 Base) MCG/ACT inhaler Inhale 1-2 puffs into the lungs 4 (four) times daily as needed for shortness of breath or wheezing.     amLODipine (NORVASC) 5 MG tablet Take 1 tablet (5 mg total) by mouth daily. 30 tablet 3   Aspirin-Salicylamide-Caffeine (BC HEADACHE PO) Take 1 packet by mouth daily as needed (pain/headache).      busPIRone (BUSPAR) 15 MG tablet Take 15 mg by mouth 2 (two) times daily.     cyclobenzaprine (FLEXERIL) 5 MG tablet Take 5 mg by mouth 3 (three) times daily as needed for muscle spasms.     dronabinol (MARINOL) 2.5 MG capsule TAKE TWO CAPSULES BY MOUTH EVERY MORNING AND 1 CAPSULE EVERY EVENING 90 capsule 0   gabapentin (NEURONTIN) 800 MG tablet Take 800 mg by mouth 3 (three) times daily.      hydrOXYzine (ATARAX/VISTARIL) 25 MG tablet Take 25-50 mg by mouth at bedtime as needed for sleep.     levothyroxine (SYNTHROID) 25 MCG tablet Take 1 tablet (25 mcg total) by mouth daily before breakfast. 30 tablet 3   naloxone (NARCAN) nasal spray 4 mg/0.1 mL Place 1 spray into the nose once as needed (opioid  reversal).     nicotine polacrilex (NICORETTE) 2 MG gum Take 1 each (2 mg total) by mouth as needed for smoking cessation. 100 tablet 0   Nutritional Supplements (OSMOLITE 1.5 CAL PO) Give 2.5 Cans by tube at bedtime. Pt states that she infuses this via an infusion pump over 8-10 hours     ondansetron (ZOFRAN-ODT) 4 MG disintegrating tablet TAKE 1 TABLET (4 MG TOTAL) BY MOUTH EVERY 6 (SIX) HOURS AS NEEDED FOR NAUSEA OR VOMITING 60 tablet 1   oxyCODONE-acetaminophen (PERCOCET) 7.5-325 MG tablet Take 1 tablet by mouth 4 (four) times daily.     pantoprazole (PROTONIX) 40 MG tablet Take 1 tablet (40 mg total) by mouth 2 (two) times daily. 30 tablet 3   polyvinyl alcohol (LIQUIFILM TEARS) 1.4 % ophthalmic solution Place 1 drop into both eyes as needed for dry eyes.     promethazine (PHENERGAN) 25 MG tablet Take 1 tablet (25 mg total) by mouth 3 (three) times daily as needed. 30 tablet 1   QUEtiapine (SEROQUEL) 200 MG tablet Take 200 mg by mouth at bedtime.     sucralfate (CARAFATE) 1 GM/10ML suspension Take 10 mLs (1 g total) by mouth 4 (four) times daily -  with meals and at bedtime. 420 mL 1   traZODone (DESYREL) 50 MG tablet Take 100 mg by mouth at bedtime.     No facility-administered medications prior to visit.       Objective:   Physical Exam:  General appearance: 51 y.o., female, NAD, frail, thin Eyes: anicteric sclerae; PERRL, tracking appropriately HENT: NCAT; MMM Neck: Trachea midline; no lymphadenopathy, no JVD Lungs: bronchial breath sounds bl, no crackles, no wheeze, with normal respiratory effort CV: RRR, no murmur  Abdomen: Soft, non-tender; non-distended, BS present   Extremities: No peripheral edema, warm Skin: Normal turgor and texture; no rash Psych: Appropriate affect Neuro: Alert and oriented to person and place, no focal deficit     There were no vitals filed for this visit.    on RA BMI Readings from Last 3 Encounters:  02/27/21 14.70 kg/m  01/23/21 13.98 kg/m  01/22/21 14.14 kg/m   Wt Readings from Last 3 Encounters:  02/27/21 74 lb (33.6 kg)  01/23/21 70 lb 6.4 oz (31.9 kg)  01/22/21 70 lb (31.8 kg)     CBC    Component Value Date/Time   WBC 9.4 01/14/2021 1437   RBC 4.42 01/14/2021 1437   HGB 11.9 (L) 01/14/2021 1437   HGB 7.4 (L) 09/05/2015 1350   HCT 38.0 01/14/2021 1437   HCT 27.1 (L) 09/05/2015 1350   PLT 471 (H) 01/14/2021 1437   PLT 241 09/05/2015 1350   MCV 86.0 01/14/2021 1437   MCV 71.9 (L) 09/05/2015 1350   MCH 26.9 01/14/2021 1437   MCHC 31.3 01/14/2021 1437   RDW 17.2 (H) 01/14/2021 1437   RDW 20.3 (H) 09/05/2015 1350   LYMPHSABS 1.5 01/14/2021 1437   LYMPHSABS 3.0 09/05/2015 1350   MONOABS 0.7 01/14/2021 1437   MONOABS 0.9 09/05/2015 1350   EOSABS 0.0 01/14/2021 1437   EOSABS 0.2 09/05/2015 1350   BASOSABS 0.0 01/14/2021 1437   BASOSABS 0.0 09/05/2015 1350     Chest Imaging: CT Chest 03/03/21 reviewed by me similar pattern, interval improved aeration  CTA Chest 12/28 reviewed by me remarkable for emphysema and widespread ggo, mediastinal LAD and patulous esophagus  CXR reviewed by me today with  3.6cm rounded opacity left mid lung field, reticular opacities on right.  Pulmonary Functions  Testing Results:    Latest Ref Rng & Units 02/27/2021   11:12 AM  PFT Results  FVC-Pre L 1.91    FVC-Predicted Pre % 63    Pre FEV1/FVC % % 80    FEV1-Pre L 1.54    FEV1-Predicted Pre % 63    DLCO uncorrected ml/min/mmHg 8.02    DLCO UNC% % 44    DLCO corrected ml/min/mmHg 8.44    DLCO COR %Predicted % 46    DLVA Predicted % 59     Reviewed by me with likely moderate restriction, moderately reduced  diffusing capacity   Echocardiogram:   TTE 01/03/21 mild mod MR, TR      Assessment & Plan:   # ILD: May be multifactorial due to recurrent aspiration, possibly with superimposed DIP due to smoking.  # Left lung mass-like consolidation:  Plan: - counseled extensively on smoking cessation, especially given possibility of DIP. Prescribed gum for her to use with patches. Declines chantix due to mood disturbance in past.  - prevnar 20 today   RTC 3 months  Maryjane Hurter, MD [facility] 05/28/2021 6:05 PM

## 2021-05-30 ENCOUNTER — Ambulatory Visit: Payer: Medicare Other | Admitting: Student

## 2021-06-04 ENCOUNTER — Other Ambulatory Visit (HOSPITAL_COMMUNITY): Payer: Self-pay | Admitting: Surgery

## 2021-06-04 DIAGNOSIS — K316 Fistula of stomach and duodenum: Secondary | ICD-10-CM

## 2021-06-05 ENCOUNTER — Ambulatory Visit (HOSPITAL_COMMUNITY)
Admission: RE | Admit: 2021-06-05 | Discharge: 2021-06-05 | Disposition: A | Discharge status: Home / Self Care | Payer: Medicare Other | Source: Ambulatory Visit | Attending: Surgery | Admitting: Surgery

## 2021-06-05 DIAGNOSIS — K9413 Enterostomy malfunction: Secondary | ICD-10-CM | POA: Insufficient documentation

## 2021-06-05 DIAGNOSIS — K316 Fistula of stomach and duodenum: Secondary | ICD-10-CM

## 2021-06-05 HISTORY — PX: IR SINUS/FIST TUBE CHK-NON GI: IMG673

## 2021-06-05 MED ORDER — IOHEXOL 300 MG/ML  SOLN
100.0000 mL | Freq: Once | INTRAMUSCULAR | Status: AC | PRN
  Administered 2021-06-05: 40 mL

## 2021-06-05 MED ORDER — LIDOCAINE VISCOUS HCL 2 % MT SOLN
OROMUCOSAL | Status: AC
  Filled 2021-06-05: qty 15

## 2021-06-17 NOTE — Progress Notes (Signed)
Sent message, via epic in basket, requesting orders in epic from surgeon.  

## 2021-06-17 NOTE — Patient Instructions (Signed)
DUE TO COVID-19 ONLY TWO VISITORS  (aged 49 and older)  ARE ALLOWED TO COME WITH YOU AND STAY IN THE WAITING ROOM ONLY DURING PRE OP AND PROCEDURE.   **NO VISITORS ARE ALLOWED IN THE SHORT STAY AREA OR RECOVERY ROOM!!**  IF YOU WILL BE ADMITTED INTO THE HOSPITAL YOU ARE ALLOWED ONLY FOUR SUPPORT PEOPLE DURING VISITATION HOURS ONLY (7 AM -8PM)   The support person(s) must pass our screening, gel in and out, and wear a mask at all times, including in the patient's room. Patients must also wear a mask when staff or their support person are in the room. Visitors GUEST BADGE MUST BE WORN VISIBLY  One adult visitor may remain with you overnight and MUST be in the room by 8 P.M.     Your procedure is scheduled on: 06/24/21   Report to [facility] Main Entrance    Report to admitting at: 9:45 AM   Call this number if you have problems the morning of surgery [phone]   Do not eat food :After Midnight.   After Midnight you may have the following liquids until: 9:00 AM DAY OF SURGERY  Water Black Coffee (sugar ok, NO MILK/CREAM OR CREAMERS)  Tea (sugar ok, NO MILK/CREAM OR CREAMERS) regular and decaf                             Plain Jell-O (NO RED)                                           Fruit ices (not with fruit pulp, NO RED)                                     Popsicles (NO RED)                                                                  Juice: apple, WHITE grape, WHITE cranberry Sports drinks like Gatorade (NO RED) Clear broth(vegetable,chicken,beef)  FOLLOW BOWEL PREP AND ANY ADDITIONAL PRE OP INSTRUCTIONS YOU RECEIVED FROM YOUR SURGEON'S OFFICE!!!  Oral Hygiene is also important to reduce your risk of infection.                                    Remember - BRUSH YOUR TEETH THE MORNING OF SURGERY WITH YOUR REGULAR TOOTHPASTE   Do NOT smoke after Midnight   Take these medicines the morning of surgery with A SIP OF WATER:  amlodipine,buspirone,hydroxyzine,gabapentin,Seroquel.Inhalers and eye drops as usual.Tylenol as needed.  DO NOT TAKE ANY ORAL DIABETIC MEDICATIONS DAY OF YOUR SURGERY  Bring CPAP mask and tubing day of surgery.                              You may not have any metal on your body including hair pins, jewelry, and body piercing  Do not wear make-up, lotions, powders, perfumes/cologne, or deodorant  Do not wear nail polish including gel and S&S, artificial/acrylic nails, or any other type of covering on natural nails including finger and toenails. If you have artificial nails, gel coating, etc. that needs to be removed by a nail salon please have this removed prior to surgery or surgery may need to be canceled/ delayed if the surgeon/ anesthesia feels like they are unable to be safely monitored.   Do not shave  48 hours prior to surgery.   Do not bring valuables to the hospital. [place].   Contacts, dentures or bridgework may not be worn into surgery.   Bring small overnight bag day of surgery.   DO NOT [place]. PHARMACY WILL DISPENSE MEDICATIONS LISTED ON YOUR MEDICATION LIST TO YOU DURING YOUR ADMISSION [place]!    Patients discharged on the day of surgery will not be allowed to drive home.  Someone NEEDS to stay with you for the first 24 hours after anesthesia.   Special Instructions: Bring a copy of your healthcare power of attorney and living will documents         the day of surgery if you haven't scanned them before.              Please read over the following fact sheets you were given: IF YOU HAVE QUESTIONS ABOUT YOUR PRE-OP INSTRUCTIONS PLEASE CALL [phone]     [facility] - Preparing for Surgery Before surgery, you can play an important role.  Because skin is not sterile, your skin needs to be as free of germs as possible.  You can reduce the number of germs on your  skin by washing with CHG (chlorahexidine gluconate) soap before surgery.  CHG is an antiseptic cleaner which kills germs and bonds with the skin to continue killing germs even after washing. Please DO NOT use if you have an allergy to CHG or antibacterial soaps.  If your skin becomes reddened/irritated stop using the CHG and inform your nurse when you arrive at Short Stay. Do not shave (including legs and underarms) for at least 48 hours prior to the first CHG shower.  You may shave your face/neck. Please follow these instructions carefully:  1.  Shower with CHG Soap the night before surgery and the  morning of Surgery.  2.  If you choose to wash your hair, wash your hair first as usual with your  normal  shampoo.  3.  After you shampoo, rinse your hair and body thoroughly to remove the  shampoo.                           4.  Use CHG as you would any other liquid soap.  You can apply chg directly  to the skin and wash                       Gently with a scrungie or clean washcloth.  5.  Apply the CHG Soap to your body ONLY FROM THE NECK DOWN.   Do not use on face/ open                           Wound or open sores. Avoid contact with eyes,  ears mouth and genitals (private parts).                       Wash face,  Genitals (private parts) with your normal soap.             6.  Wash thoroughly, paying special attention to the area where your surgery  will be performed.  7.  Thoroughly rinse your body with warm water from the neck down.  8.  DO NOT shower/wash with your normal soap after using and rinsing off  the CHG Soap.                9.  Pat yourself dry with a clean towel.            10.  Wear clean pajamas.            11.  Place clean sheets on your bed the night of your first shower and do not  sleep with pets. Day of Surgery : Do not apply any lotions/deodorants the morning of surgery.  Please wear clean clothes to the hospital/surgery center.  FAILURE TO FOLLOW THESE INSTRUCTIONS MAY  RESULT IN THE CANCELLATION OF YOUR SURGERY PATIENT SIGNATURE_________________________________  NURSE SIGNATURE__________________________________  ________________________________________________________________________

## 2021-06-18 ENCOUNTER — Encounter (HOSPITAL_COMMUNITY)
Admission: RE | Admit: 2021-06-18 | Discharge: 2021-06-18 | Disposition: A | Discharge status: Home / Self Care | Payer: Medicare Other | Source: Ambulatory Visit | Attending: Surgery | Admitting: Surgery

## 2021-06-18 ENCOUNTER — Encounter (HOSPITAL_COMMUNITY): Payer: Self-pay

## 2021-06-18 ENCOUNTER — Other Ambulatory Visit: Payer: Self-pay

## 2021-06-18 DIAGNOSIS — Z01812 Encounter for preprocedural laboratory examination: Secondary | ICD-10-CM | POA: Insufficient documentation

## 2021-06-18 DIAGNOSIS — Z01818 Encounter for other preprocedural examination: Secondary | ICD-10-CM

## 2021-06-18 HISTORY — DX: Pneumonia, unspecified organism: J18.9

## 2021-06-18 NOTE — Progress Notes (Signed)
For Short Stay: [place] appointment date: Date of COVID positive in last 73 days:  Bowel Prep reminder:   For Anesthesia: PCP - Cyndi Bender: PA-C Cardiologist -   Chest x-ray - 02/28/21 CT Chest: 03/03/21 EKG - 01/14/21 Stress Test -  ECHO - 01/03/21 Cardiac Cath -  Pacemaker/ICD device last checked: Pacemaker orders received: Device Rep notified:  Spinal Cord Stimulator:  Sleep Study -  CPAP -   Fasting Blood Sugar -  Checks Blood Sugar _____ times a day Date and result of last Hgb A1c-  Blood Thinner Instructions: Aspirin Instructions: Last Dose:  Activity level: Can go up a flight of stairs and activities of daily living without stopping and without chest pain and/or shortness of breath   Able to exercise without chest pain and/or shortness of breath   Unable to go up a flight of stairs without chest pain and/or shortness of breath     Anesthesia review: Hx: COPD,Smoker  Patient denies shortness of breath, fever, cough and chest pain at PAT appointment   Patient verbalized understanding of instructions that were given to them at the PAT appointment. Patient was also instructed that they will need to review over the PAT instructions again at home before surgery.

## 2021-06-19 ENCOUNTER — Encounter (HOSPITAL_COMMUNITY)
Admission: RE | Admit: 2021-06-19 | Discharge: 2021-06-19 | Disposition: A | Discharge status: Home / Self Care | Payer: Medicare Other | Source: Ambulatory Visit | Attending: Surgery | Admitting: Surgery

## 2021-06-19 DIAGNOSIS — Z01812 Encounter for preprocedural laboratory examination: Secondary | ICD-10-CM | POA: Insufficient documentation

## 2021-06-19 DIAGNOSIS — Z01818 Encounter for other preprocedural examination: Secondary | ICD-10-CM

## 2021-06-19 LAB — BASIC METABOLIC PANEL
Anion gap: 7 (ref 5–15)
BUN: 18 mg/dL (ref 6–20)
CO2: 24 mmol/L (ref 22–32)
Calcium: 9.1 mg/dL (ref 8.9–10.3)
Chloride: 112 mmol/L — ABNORMAL HIGH (ref 98–111)
Creatinine, Ser: 0.73 mg/dL (ref 0.44–1.00)
GFR, Estimated: >60 mL/min (ref >60)
Glucose, Bld: 81 mg/dL (ref 70–99)
Potassium: 4.5 mmol/L (ref 3.5–5.1)
Sodium: 143 mmol/L (ref 135–145)

## 2021-06-19 LAB — CBC
HCT: 34 % — ABNORMAL LOW (ref 36.0–46.0)
Hemoglobin: 10.2 g/dL — ABNORMAL LOW (ref 12.0–15.0)
MCH: 25.8 pg — ABNORMAL LOW (ref 26.0–34.0)
MCHC: 30 g/dL (ref 30.0–36.0)
MCV: 85.9 fL (ref 80.0–100.0)
Platelets: 349 10*3/uL (ref 150–400)
RBC: 3.96 MIL/uL (ref 3.87–5.11)
RDW: 18.4 % — ABNORMAL HIGH (ref 11.5–15.5)
WBC: 14 10*3/uL — ABNORMAL HIGH (ref 4.0–10.5)
nRBC: 0 % (ref 0.0–0.2)

## 2021-06-24 ENCOUNTER — Other Ambulatory Visit: Payer: Self-pay

## 2021-06-24 ENCOUNTER — Inpatient Hospital Stay (HOSPITAL_COMMUNITY): Payer: Medicare Other | Admitting: Certified Registered Nurse Anesthetist

## 2021-06-24 ENCOUNTER — Encounter (HOSPITAL_COMMUNITY)
Admission: RE | Disposition: A | Discharge status: Home / Self Care | Payer: Self-pay | Source: Home / Self Care | Attending: Surgery

## 2021-06-24 ENCOUNTER — Inpatient Hospital Stay (HOSPITAL_COMMUNITY)
Admission: RE | Admit: 2021-06-24 | Discharge: 2021-06-25 | DRG: 908 | Disposition: A | Discharge status: Home / Self Care | Payer: Medicare Other | Attending: Surgery | Admitting: Surgery

## 2021-06-24 ENCOUNTER — Encounter (HOSPITAL_COMMUNITY): Payer: Self-pay | Admitting: Surgery

## 2021-06-24 DIAGNOSIS — J449 Chronic obstructive pulmonary disease, unspecified: Secondary | ICD-10-CM | POA: Diagnosis present

## 2021-06-24 DIAGNOSIS — F32A Depression, unspecified: Secondary | ICD-10-CM | POA: Diagnosis present

## 2021-06-24 DIAGNOSIS — Z79899 Other long term (current) drug therapy: Secondary | ICD-10-CM

## 2021-06-24 DIAGNOSIS — Z832 Family history of diseases of the blood and blood-forming organs and certain disorders involving the immune mechanism: Secondary | ICD-10-CM

## 2021-06-24 DIAGNOSIS — Z8249 Family history of ischemic heart disease and other diseases of the circulatory system: Secondary | ICD-10-CM | POA: Diagnosis not present

## 2021-06-24 DIAGNOSIS — F17210 Nicotine dependence, cigarettes, uncomplicated: Secondary | ICD-10-CM | POA: Diagnosis present

## 2021-06-24 DIAGNOSIS — G8929 Other chronic pain: Secondary | ICD-10-CM | POA: Diagnosis present

## 2021-06-24 DIAGNOSIS — Z87442 Personal history of urinary calculi: Secondary | ICD-10-CM | POA: Diagnosis not present

## 2021-06-24 DIAGNOSIS — Z8719 Personal history of other diseases of the digestive system: Secondary | ICD-10-CM

## 2021-06-24 DIAGNOSIS — K9413 Enterostomy malfunction: Secondary | ICD-10-CM

## 2021-06-24 DIAGNOSIS — Z8711 Personal history of peptic ulcer disease: Secondary | ICD-10-CM

## 2021-06-24 DIAGNOSIS — T8183XA Persistent postprocedural fistula, initial encounter: Principal | ICD-10-CM | POA: Diagnosis present

## 2021-06-24 DIAGNOSIS — M797 Fibromyalgia: Secondary | ICD-10-CM | POA: Diagnosis present

## 2021-06-24 DIAGNOSIS — K632 Fistula of intestine: Secondary | ICD-10-CM | POA: Diagnosis present

## 2021-06-24 DIAGNOSIS — Z803 Family history of malignant neoplasm of breast: Secondary | ICD-10-CM

## 2021-06-24 DIAGNOSIS — Z825 Family history of asthma and other chronic lower respiratory diseases: Secondary | ICD-10-CM | POA: Diagnosis not present

## 2021-06-24 DIAGNOSIS — F419 Anxiety disorder, unspecified: Secondary | ICD-10-CM | POA: Diagnosis present

## 2021-06-24 DIAGNOSIS — Z903 Acquired absence of stomach [part of]: Secondary | ICD-10-CM | POA: Diagnosis not present

## 2021-06-24 DIAGNOSIS — Z9049 Acquired absence of other specified parts of digestive tract: Principal | ICD-10-CM

## 2021-06-24 DIAGNOSIS — K219 Gastro-esophageal reflux disease without esophagitis: Secondary | ICD-10-CM | POA: Diagnosis present

## 2021-06-24 HISTORY — PX: LAPAROSCOPIC JEJUNOSTOMY: SHX6880

## 2021-06-24 LAB — CREATININE, SERUM
Creatinine, Ser: 0.73 mg/dL (ref 0.44–1.00)
GFR, Estimated: >60 mL/min (ref >60)

## 2021-06-24 LAB — CBC
HCT: 35.1 % — ABNORMAL LOW (ref 36.0–46.0)
Hemoglobin: 10.6 g/dL — ABNORMAL LOW (ref 12.0–15.0)
MCH: 25.7 pg — ABNORMAL LOW (ref 26.0–34.0)
MCHC: 30.2 g/dL (ref 30.0–36.0)
MCV: 85 fL (ref 80.0–100.0)
Platelets: 452 10*3/uL — ABNORMAL HIGH (ref 150–400)
RBC: 4.13 MIL/uL (ref 3.87–5.11)
RDW: 18.5 % — ABNORMAL HIGH (ref 11.5–15.5)
WBC: 14.8 10*3/uL — ABNORMAL HIGH (ref 4.0–10.5)
nRBC: 0 % (ref 0.0–0.2)

## 2021-06-24 SURGERY — CREATION, JEJUNOSTOMY, LAPAROSCOPIC
Anesthesia: General

## 2021-06-24 MED ORDER — DEXAMETHASONE SODIUM PHOSPHATE 10 MG/ML IJ SOLN
INTRAMUSCULAR | Status: DC | PRN
  Administered 2021-06-24: 5 mg via INTRAVENOUS

## 2021-06-24 MED ORDER — FENTANYL CITRATE PF 50 MCG/ML IJ SOSY
12.5000 ug | PREFILLED_SYRINGE | INTRAMUSCULAR | Status: DC | PRN
  Filled 2021-06-24: qty 1

## 2021-06-24 MED ORDER — BUSPIRONE HCL 5 MG PO TABS
15.0000 mg | ORAL_TABLET | Freq: Two times a day (BID) | ORAL | Status: DC
  Administered 2021-06-24 – 2021-06-25 (×2): 15 mg via ORAL
  Filled 2021-06-24 (×2): qty 3

## 2021-06-24 MED ORDER — BUPIVACAINE LIPOSOME 1.3 % IJ SUSP
INTRAMUSCULAR | Status: AC
  Filled 2021-06-24: qty 20

## 2021-06-24 MED ORDER — ACETAMINOPHEN 325 MG PO TABS: 650.0000 mg | ORAL_TABLET | Freq: Four times a day (QID) | ORAL | Status: DC | PRN

## 2021-06-24 MED ORDER — DEXAMETHASONE SODIUM PHOSPHATE 10 MG/ML IJ SOLN
INTRAMUSCULAR | Status: AC
Start: 2021-06-24 — End: ?
  Filled 2021-06-24: qty 1

## 2021-06-24 MED ORDER — AMLODIPINE BESYLATE 5 MG PO TABS: 5.0000 mg | ORAL_TABLET | Freq: Every day | ORAL | Status: DC

## 2021-06-24 MED ORDER — ONDANSETRON HCL 4 MG/2ML IJ SOLN: 4.0000 mg | Freq: Once | INTRAMUSCULAR | Status: DC | PRN

## 2021-06-24 MED ORDER — KCL IN DEXTROSE-NACL 20-5-0.45 MEQ/L-%-% IV SOLN
INTRAVENOUS | Status: DC
  Filled 2021-06-24: qty 1000

## 2021-06-24 MED ORDER — MIDAZOLAM HCL 2 MG/2ML IJ SOLN
INTRAMUSCULAR | Status: AC
  Filled 2021-06-24: qty 2

## 2021-06-24 MED ORDER — SCOPOLAMINE 1 MG/3DAYS TD PT72
1.0000 | MEDICATED_PATCH | TRANSDERMAL | Status: DC
  Administered 2021-06-24: 1.5 mg via TRANSDERMAL
  Filled 2021-06-24: qty 1

## 2021-06-24 MED ORDER — ORAL CARE MOUTH RINSE: 15.0000 mL | Freq: Once | OROMUCOSAL | Status: AC

## 2021-06-24 MED ORDER — CHLORHEXIDINE GLUCONATE CLOTH 2 % EX PADS: 6.0000 | MEDICATED_PAD | Freq: Once | CUTANEOUS | Status: DC

## 2021-06-24 MED ORDER — HEPARIN SODIUM (PORCINE) 5000 UNIT/ML IJ SOLN
5000.0000 [IU] | Freq: Three times a day (TID) | INTRAMUSCULAR | Status: DC
  Administered 2021-06-24 – 2021-06-25 (×2): 5000 [IU] via SUBCUTANEOUS
  Filled 2021-06-24 (×2): qty 1

## 2021-06-24 MED ORDER — FENTANYL CITRATE PF 50 MCG/ML IJ SOSY: 25.0000 ug | PREFILLED_SYRINGE | INTRAMUSCULAR | Status: DC | PRN

## 2021-06-24 MED ORDER — BUPIVACAINE-EPINEPHRINE (PF) 0.25% -1:200000 IJ SOLN
INTRAMUSCULAR | Status: AC
  Filled 2021-06-24: qty 30

## 2021-06-24 MED ORDER — PROPOFOL 10 MG/ML IV BOLUS
INTRAVENOUS | Status: AC
  Filled 2021-06-24: qty 20

## 2021-06-24 MED ORDER — SODIUM CHLORIDE (PF) 0.9 % IJ SOLN
INTRAMUSCULAR | Status: AC
  Filled 2021-06-24: qty 10

## 2021-06-24 MED ORDER — SODIUM CHLORIDE 0.9 % IV SOLN
INTRAVENOUS | Status: DC | PRN
  Administered 2021-06-24: 30 mL

## 2021-06-24 MED ORDER — SUGAMMADEX SODIUM 200 MG/2ML IV SOLN
INTRAVENOUS | Status: DC | PRN
  Administered 2021-06-24: 200 mg via INTRAVENOUS

## 2021-06-24 MED ORDER — PHENYLEPHRINE HCL-NACL 20-0.9 MG/250ML-% IV SOLN
INTRAVENOUS | Status: DC | PRN
  Administered 2021-06-24: 35 ug/min via INTRAVENOUS

## 2021-06-24 MED ORDER — ESMOLOL HCL 100 MG/10ML IV SOLN
INTRAVENOUS | Status: AC
  Filled 2021-06-24: qty 10

## 2021-06-24 MED ORDER — QUETIAPINE FUMARATE 50 MG PO TABS
200.0000 mg | ORAL_TABLET | Freq: Every day | ORAL | Status: DC
  Administered 2021-06-24: 200 mg via ORAL
  Filled 2021-06-24: qty 4

## 2021-06-24 MED ORDER — CEFAZOLIN SODIUM-DEXTROSE 2-4 GM/100ML-% IV SOLN
2.0000 g | Freq: Three times a day (TID) | INTRAVENOUS | Status: AC
  Filled 2021-06-24: qty 100

## 2021-06-24 MED ORDER — ALBUTEROL SULFATE (2.5 MG/3ML) 0.083% IN NEBU
2.5000 mg | INHALATION_SOLUTION | Freq: Four times a day (QID) | RESPIRATORY_TRACT | Status: DC | PRN
Start: 2021-06-24 — End: 2021-06-25

## 2021-06-24 MED ORDER — BUPIVACAINE LIPOSOME 1.3 % IJ SUSP: 20.0000 mL | Freq: Once | INTRAMUSCULAR | Status: DC

## 2021-06-24 MED ORDER — CHLORHEXIDINE GLUCONATE 0.12 % MT SOLN
15.0000 mL | Freq: Once | OROMUCOSAL | Status: AC
  Administered 2021-06-24: 15 mL via OROMUCOSAL

## 2021-06-24 MED ORDER — ROCURONIUM BROMIDE 10 MG/ML (PF) SYRINGE
PREFILLED_SYRINGE | INTRAVENOUS | Status: DC | PRN
  Administered 2021-06-24 (×2): 20 mg via INTRAVENOUS
  Administered 2021-06-24: 30 mg via INTRAVENOUS

## 2021-06-24 MED ORDER — ALBUTEROL SULFATE HFA 108 (90 BASE) MCG/ACT IN AERS
INHALATION_SPRAY | RESPIRATORY_TRACT | Status: DC | PRN
  Administered 2021-06-24: 5 via RESPIRATORY_TRACT

## 2021-06-24 MED ORDER — ALBUTEROL SULFATE HFA 108 (90 BASE) MCG/ACT IN AERS
INHALATION_SPRAY | RESPIRATORY_TRACT | Status: AC
  Filled 2021-06-24: qty 6.7

## 2021-06-24 MED ORDER — TRAZODONE HCL 50 MG PO TABS
50.0000 mg | ORAL_TABLET | Freq: Every day | ORAL | Status: DC
  Administered 2021-06-24: 50 mg via ORAL
  Filled 2021-06-24: qty 1

## 2021-06-24 MED ORDER — FENTANYL CITRATE (PF) 250 MCG/5ML IJ SOLN
INTRAMUSCULAR | Status: AC
  Filled 2021-06-24: qty 5

## 2021-06-24 MED ORDER — LACTATED RINGERS IR SOLN
Status: DC | PRN
  Administered 2021-06-24: 1000 mL

## 2021-06-24 MED ORDER — OXYCODONE-ACETAMINOPHEN 5-325 MG PO TABS
1.0000 | ORAL_TABLET | ORAL | Status: DC | PRN
  Administered 2021-06-24 – 2021-06-25 (×3): 1 via ORAL
  Filled 2021-06-24 (×3): qty 1

## 2021-06-24 MED ORDER — PROPOFOL 10 MG/ML IV BOLUS
INTRAVENOUS | Status: DC | PRN
  Administered 2021-06-24: 100 mg via INTRAVENOUS

## 2021-06-24 MED ORDER — SODIUM CHLORIDE 0.9 % IV SOLN
2.0000 g | INTRAVENOUS | Status: AC
  Administered 2021-06-24: 2 g via INTRAVENOUS
  Filled 2021-06-24: qty 2

## 2021-06-24 MED ORDER — PROMETHAZINE HCL 25 MG PO TABS
25.0000 mg | ORAL_TABLET | Freq: Three times a day (TID) | ORAL | Status: DC | PRN
Start: 2021-06-24 — End: 2021-06-25
  Administered 2021-06-24: 25 mg via ORAL
  Filled 2021-06-24 (×2): qty 1

## 2021-06-24 MED ORDER — POLYVINYL ALCOHOL 1.4 % OP SOLN: 1.0000 [drp] | OPHTHALMIC | Status: DC | PRN

## 2021-06-24 MED ORDER — ONDANSETRON HCL 4 MG/2ML IJ SOLN
INTRAMUSCULAR | Status: AC
  Filled 2021-06-24: qty 2

## 2021-06-24 MED ORDER — LACTATED RINGERS IV SOLN: INTRAVENOUS | Status: DC

## 2021-06-24 MED ORDER — ONDANSETRON 4 MG PO TBDP
4.0000 mg | ORAL_TABLET | Freq: Four times a day (QID) | ORAL | Status: DC | PRN
Start: 2021-06-24 — End: 2021-06-25

## 2021-06-24 MED ORDER — CYCLOBENZAPRINE HCL 5 MG PO TABS
5.0000 mg | ORAL_TABLET | Freq: Three times a day (TID) | ORAL | Status: DC
  Administered 2021-06-24 – 2021-06-25 (×3): 5 mg via ORAL
  Filled 2021-06-24 (×3): qty 1

## 2021-06-24 MED ORDER — FENTANYL CITRATE (PF) 100 MCG/2ML IJ SOLN
INTRAMUSCULAR | Status: DC | PRN
  Administered 2021-06-24 (×4): 25 ug via INTRAVENOUS
  Administered 2021-06-24: 50 ug via INTRAVENOUS

## 2021-06-24 MED ORDER — PAROXETINE HCL 20 MG PO TABS
20.0000 mg | ORAL_TABLET | Freq: Every day | ORAL | Status: DC
  Administered 2021-06-24: 20 mg via ORAL
  Filled 2021-06-24: qty 1

## 2021-06-24 MED ORDER — ROCURONIUM BROMIDE 10 MG/ML (PF) SYRINGE
PREFILLED_SYRINGE | INTRAVENOUS | Status: AC
  Filled 2021-06-24: qty 10

## 2021-06-24 MED ORDER — LABETALOL HCL 5 MG/ML IV SOLN
INTRAVENOUS | Status: AC
  Filled 2021-06-24: qty 4

## 2021-06-24 MED ORDER — ONDANSETRON HCL 4 MG/2ML IJ SOLN
INTRAMUSCULAR | Status: DC | PRN
  Administered 2021-06-24: 4 mg via INTRAVENOUS

## 2021-06-24 MED ORDER — LEVOTHYROXINE SODIUM 25 MCG PO TABS: 25.0000 ug | ORAL_TABLET | Freq: Every day | ORAL | Status: DC

## 2021-06-24 MED ORDER — LIDOCAINE 2% (20 MG/ML) 5 ML SYRINGE
INTRAMUSCULAR | Status: DC | PRN
  Administered 2021-06-24: 40 mg via INTRAVENOUS

## 2021-06-24 MED ORDER — MIDAZOLAM HCL 5 MG/5ML IJ SOLN
INTRAMUSCULAR | Status: DC | PRN
  Administered 2021-06-24: 1 mg via INTRAVENOUS

## 2021-06-24 MED ORDER — METOPROLOL TARTRATE 5 MG/5ML IV SOLN: 5.0000 mg | Freq: Four times a day (QID) | INTRAVENOUS | Status: DC | PRN

## 2021-06-24 SURGICAL SUPPLY — 68 items
APL SKNCLS STERI-STRIP NONHPOA (GAUZE/BANDAGES/DRESSINGS)
APPLIER CLIP ROT 10 11.4 M/L (STAPLE)
APR CLP MED LRG 11.4X10 (STAPLE)
BAG COUNTER SPONGE SURGICOUNT (BAG) IMPLANT
BAG SPNG CNTER NS LX DISP (BAG)
BENZOIN TINCTURE PRP APPL 2/3 (GAUZE/BANDAGES/DRESSINGS) ×2 IMPLANT
CABLE HIGH FREQUENCY MONO STRZ (ELECTRODE) IMPLANT
CATH ROBINSON RED A/P 14FR (CATHETERS) ×1 IMPLANT
CLIP APPLIE ROT 10 11.4 M/L (STAPLE) IMPLANT
DEVICE SUT QUICK LOAD TK 5 (SUTURE) IMPLANT
DEVICE SUT TI-KNOT TK 5X26 (SUTURE) IMPLANT
DEVICE SUTURE ENDOST 10MM (ENDOMECHANICALS) ×2 IMPLANT
DISSECTOR BLUNT TIP ENDO 5MM (MISCELLANEOUS) ×2 IMPLANT
DRAIN PENROSE 0.5X18 (DRAIN) ×2 IMPLANT
DRAPE LAPAROSCOPIC ABDOMINAL (DRAPES) ×3 IMPLANT
ELECT PENCIL ROCKER SW 15FT (MISCELLANEOUS) ×1 IMPLANT
ELECT REM PT RETURN 15FT ADLT (MISCELLANEOUS) ×3 IMPLANT
GAUZE PACKING IODOFORM 1X5 (PACKING) ×1 IMPLANT
GLOVE BIOGEL PI IND STRL 7.0 (GLOVE) IMPLANT
GLOVE BIOGEL PI INDICATOR 7.0 (GLOVE)
GLOVE SURG LX 8.0 MICRO (GLOVE) ×2
GLOVE SURG LX STRL 8.0 MICRO (GLOVE) ×2 IMPLANT
GOWN SPEC L4 XLG W/TWL (GOWN DISPOSABLE) ×2 IMPLANT
GOWN STRL REUS W/ TWL XL LVL3 (GOWN DISPOSABLE) ×6 IMPLANT
GOWN STRL REUS W/TWL XL LVL3 (GOWN DISPOSABLE) ×4
HANDLE STAPLE EGIA 4 XL (STAPLE) ×1 IMPLANT
IRRIG SUCT STRYKERFLOW 2 WTIP (MISCELLANEOUS) ×2
IRRIGATION SUCT STRKRFLW 2 WTP (MISCELLANEOUS) ×2 IMPLANT
KIT BASIN OR (CUSTOM PROCEDURE TRAY) ×3 IMPLANT
KIT TURNOVER KIT A (KITS) ×1 IMPLANT
NS IRRIG 1000ML POUR BTL (IV SOLUTION) ×3 IMPLANT
PAD POSITIONING PINK XL (MISCELLANEOUS) IMPLANT
PENCIL SMOKE EVACUATOR (MISCELLANEOUS) IMPLANT
PLUG CATH AND CAP STER (CATHETERS) ×1 IMPLANT
PROTECTOR NERVE ULNAR (MISCELLANEOUS) IMPLANT
RELOAD EGIA 45 MED/THCK PURPLE (STAPLE) ×1 IMPLANT
RELOAD EGIA 60 MED/THCK PURPLE (STAPLE) ×2 IMPLANT
RELOAD EGIA 60 TAN VASC (STAPLE) ×1 IMPLANT
RELOAD STAPLE 60 MED/THCK ART (STAPLE) IMPLANT
SCISSORS LAP 5X45 EPIX DISP (ENDOMECHANICALS) ×1 IMPLANT
SET TUBE SMOKE EVAC HIGH FLOW (TUBING) ×3 IMPLANT
SHEARS HARMONIC ACE PLUS 45CM (MISCELLANEOUS) ×1 IMPLANT
SLEEVE ADV FIXATION 5X100MM (TROCAR) IMPLANT
SOL ANTI FOG 6CC (MISCELLANEOUS) ×2 IMPLANT
SOLUTION ANTI FOG 6CC (MISCELLANEOUS)
SPIKE FLUID TRANSFER (MISCELLANEOUS) ×3 IMPLANT
STAPLER VISISTAT 35W (STAPLE) ×3 IMPLANT
STRIP CLOSURE SKIN 1/2X4 (GAUZE/BANDAGES/DRESSINGS) IMPLANT
SUT ETHILON 3 0 PS 1 (SUTURE) ×1 IMPLANT
SUT NOVA NAB GS-21 0 18 T12 DT (SUTURE) ×1 IMPLANT
SUT PDS AB 4-0 SH 27 (SUTURE) ×2 IMPLANT
SUT SILK 3 0 SH CR/8 (SUTURE) ×1 IMPLANT
SUT SURGIDAC NAB ES-9 0 48 120 (SUTURE) ×8 IMPLANT
SUT VIC AB 4-0 SH 18 (SUTURE) ×2 IMPLANT
SYR 30ML LL (SYRINGE) ×2 IMPLANT
TAPE CLOTH 4X10 WHT NS (GAUZE/BANDAGES/DRESSINGS) IMPLANT
TIP INNERVISION DETACH 40FR (MISCELLANEOUS) IMPLANT
TIP INNERVISION DETACH 50FR (MISCELLANEOUS) IMPLANT
TIP INNERVISION DETACH 56FR (MISCELLANEOUS) IMPLANT
TIPS INNERVISION DETACH 40FR (MISCELLANEOUS)
TRAY LAPAROSCOPIC (CUSTOM PROCEDURE TRAY) ×3 IMPLANT
TROCAR 11X100 Z THREAD (TROCAR) ×2 IMPLANT
TROCAR ADV FIXATION 12X100MM (TROCAR) ×1 IMPLANT
TROCAR ADV FIXATION 5X100MM (TROCAR) IMPLANT
TROCAR BALLN 12MMX100 BLUNT (TROCAR) IMPLANT
TROCAR BLADELESS OPT 5 100 (ENDOMECHANICALS) ×1 IMPLANT
TROCAR XCEL UNIV SLVE 11M 100M (ENDOMECHANICALS) IMPLANT
TROCAR Z-THREAD OPTICAL 5X100M (TROCAR) ×3 IMPLANT

## 2021-06-24 NOTE — Transfer of Care (Signed)
Immediate Anesthesia Transfer of Care Note  Patient: [name]  Procedure(s) Performed: LAPAROSCOPIC TAKEDOWN OF FEEDING JEJUNOSTOMY  Patient Location: PACU  Anesthesia Type:General  Level of Consciousness: awake, alert  and oriented  Airway & Oxygen Therapy: Patient Spontanous Breathing and Patient connected to face mask oxygen  Post-op Assessment: Report given to RN and Post -op Vital signs reviewed and stable  Post vital signs: Reviewed and stable  Last Vitals:  Vitals Value Taken Time  BP 120/79 06/24/21 1230  Temp    Pulse 82 06/24/21 1232  Resp 12 06/24/21 1232  SpO2 100 % 06/24/21 1232  Vitals shown include unvalidated device data.  Last Pain:  Vitals:   06/24/21 1014  TempSrc:   PainSc: 0-No pain         Complications: No notable events documented.

## 2021-06-24 NOTE — Interval H&P Note (Signed)
History and Physical Interval Note:  06/24/2021 10:07 AM  [name]  has presented today for surgery, with the diagnosis of JEJUNOSTOMY FISTULA.  The various methods of treatment have been discussed with the patient and family. After consideration of risks, benefits and other options for treatment, the patient has consented to  Procedure(s): LAPAROSCOPIC TAKEDOWN OF FEEDING JEJUNOSTOMY (N/A) as a surgical intervention.  The patient's history has been reviewed, patient examined, no change in status, stable for surgery.  I have reviewed the patient's chart and labs.  Questions were answered to the patient's satisfaction.     Pedro Earls

## 2021-06-24 NOTE — Op Note (Signed)
[name]  29-Sep-1970   06/24/2021    PCP:  Cyndi Bender, PA-C   Surgeon: Kaylyn Lim, MD, FACS  Asst:  Radonna Ricker, MD  Anes:  general  Preop Dx: Failure to close feeding jejunostomy Postop Dx: Same, post takedown of feeding jejunostomy  Procedure: Laparoscopically assisted takedown of feeding jejunostomy Location Surgery: [facility] Complications:  None noted  EBL:   minimal cc  Drains: none  Description of Procedure:  The patient was taken to OR 4 .  After anesthesia was administered and the patient was prepped  with chloroprep  and a timeout was performed.  Access to the abdomen was obtained with a 5 mm Optiview through the right upper quadrant.  The feeding jejunostomy was visualized and I went ahead and cannulated it with a red Robinson catheter.  Adhesions were taken down with sharply and there was 1 rather large bandlike adhesion that was divided with the harmonic scalpel.  That could be a potential site of an obstruction and was removed it was taken down and removed.  At that point I made an ellipse transversely including the side of the exit and and took out just the fascial down to the fascia and pulled up the small bowel and the fistula.  We resected the fistula with 1 application of the 4.5 mm endoscopic suturing device with a purple load.  I then made 2 openings in the 2 limbs of the loop and with a 6 cm tan load fired that to create the enteroenterostomy.  The anastomosis was checked and was patent.  The common defect was closed in 2 layers with 4-0 PDS and with interrupted Lembert's of 3-0 silk.  Gloves were changed. This was reduced into the abdomen.  The wounds were blocked with Exparel diluted to 30 cc.  2 vertical mattress sutures were placed around the elliptical excision site and then the mid was portion was packed with iodoform gauze.  The 2 trocar sites were also injected with Exparel as well.  The patient tolerated the procedure well and was taken to the PACU  in stable condition.     [name] Done, [place], [facility] Surgery, [place]

## 2021-06-24 NOTE — Progress Notes (Signed)
Patient asking RN about discharge, RN informed patient that she is being kept overnight for observation. Patient continuing to call out asking RN to call MD to ask him to discharge her tonight. Called MD and he informed RN that he is keeping patient overnight for observation and for IV fluids so that patient does not get dehydrated. RN informed patient of this information.

## 2021-06-24 NOTE — Anesthesia Postprocedure Evaluation (Signed)
Anesthesia Post Note  Patient: [name]  Procedure(s) Performed: LAPAROSCOPIC TAKEDOWN OF FEEDING JEJUNOSTOMY     Patient location during evaluation: PACU Anesthesia Type: General Level of consciousness: awake and alert Pain management: pain level controlled Vital Signs Assessment: post-procedure vital signs reviewed and stable Respiratory status: spontaneous breathing, nonlabored ventilation, respiratory function stable and patient connected to nasal cannula oxygen Cardiovascular status: blood pressure returned to baseline and stable Postop Assessment: no apparent nausea or vomiting Anesthetic complications: no   No notable events documented.  Last Vitals:  Vitals:   06/24/21 1455 06/24/21 1557  BP: (!) 139/93 116/83  Pulse: 81 77  Resp: 15 16  Temp: 36.8 C 36.6 C  SpO2: 98% 99%    Last Pain:  Vitals:   06/24/21 1557  TempSrc: Oral  PainSc:                  Santa Lighter

## 2021-06-24 NOTE — H&P (Signed)
Chief Complaint:  feeding jejunostomy site irritated and won't close  History of Present Illness:  [name] is an 51 y.o. female who has had a feeding jejunostomy for several years.  She requested its removal but the tract has not closed.  She requests surgical takedown.  She understands and accepts risks  Past Medical History:  Diagnosis Date   Allergic rhinitis    Anemia    Anxiety    Anxiety disorder    Aspiration into airway    per pt hx on 08/07/19 aspirates food into lungs when asleep    Calcaneus fracture, right 06/19/2017   Chronic interstitial cystitis    Chronic pain 06/19/2017   COPD (chronic obstructive pulmonary disease) (HCC)    Depression    Fibromyalgia    Gastric outlet obstruction    Gastric ulcer    GERD (gastroesophageal reflux disease)    Hiatal hernia    History of kidney stones    Hypothyroidism    in the past   IBS (irritable bowel syndrome)    IDA (iron deficiency anemia)    Internal hemorrhoids    Kidney stones    Myalgia    OCD (obsessive compulsive disorder)    Pneumonia    Ulcerative esophagitis    Vitamin D deficiency     Past Surgical History:  Procedure Laterality Date   56 HOUR [place] STUDY N/A 03/15/2019   Procedure: 24 HOUR [employer] STUDY;  Surgeon: Jerene Bears, MD;  Location: [facility];  Service: Gastroenterology;  Laterality: N/A;   ABDOMINAL HYSTERECTOMY     BILROTH I PROCEDURE  06/22/2016   Dr Pauletta Browns---- due to gastric ulcer   ESOPHAGEAL MANOMETRY N/A 03/15/2019   Procedure: ESOPHAGEAL MANOMETRY (EM);  Surgeon: Jerene Bears, MD;  Location: [facility];  Service: Gastroenterology;  Laterality: N/A;   EXTERNAL EAR SURGERY     dont know specific ear sugery (inner/external)   FRACTURE SURGERY     right arm   gastrectomy and bilateral vagotomy      IR CM INJ ANY COLONIC TUBE W/FLUORO  12/30/2020   IR REPLC DUODEN/JEJUNO TUBE PERCUT W/FLUORO  09/15/2019   IR REPLC DUODEN/JEJUNO TUBE PERCUT W/FLUORO  07/23/2020   IR REPLC DUODEN/JEJUNO  TUBE PERCUT W/FLUORO  09/23/2020   IR REPLC DUODEN/JEJUNO TUBE PERCUT W/FLUORO  12/27/2020   IR SINUS/FIST TUBE CHK-NON GI  03/14/2020   IR SINUS/FIST TUBE CHK-NON GI  06/05/2021   LAPAROSCOPIC JEJUNOSTOMY N/A 08/09/2019   Procedure: LAPAROSCOPIC FEEDING JEJUNOSTOMY;  Surgeon: Johnathan Hausen, MD;  Location: [facility];  Service: General;  Laterality: N/A;   mutliple surgeries due to intertitial cystitis     bladder stimulator   ORIF TIBIA FRACTURE Right 06/17/2017   Procedure: OPEN REDUCTION INTERNAL FIXATION (ORIF) DISTAL TIBIA FRACTURE AND CALCANEOUS;  Surgeon: Altamese Dos Palos, MD;  Location: [place];  Service: Orthopedics;  Laterality: Right;   OTHER SURGICAL HISTORY     TENS implantation for Chronic Interstitial Cystitis   PARTIAL GASTRECTOMY      No current facility-administered medications for this encounter.   Current Outpatient Medications  Medication Sig Dispense Refill   acetaminophen (TYLENOL) 325 MG tablet Take 650 mg by mouth every 6 (six) hours as needed for moderate pain.     albuterol (VENTOLIN HFA) 108 (90 Base) MCG/ACT inhaler Inhale 1-2 puffs into the lungs 4 (four) times daily as needed for shortness of breath or wheezing.     Aspirin-Salicylamide-Caffeine (BC HEADACHE PO) Take 1 packet by mouth daily as  needed (pain/headache).      busPIRone (BUSPAR) 15 MG tablet Take 15 mg by mouth 2 (two) times daily.     cyclobenzaprine (FLEXERIL) 5 MG tablet Take 5 mg by mouth 3 (three) times daily.     fentaNYL (DURAGESIC) 12 MCG/HR Place 1 patch onto the skin every 3 (three) days.     gabapentin (NEURONTIN) 800 MG tablet Take 800 mg by mouth 3 (three) times daily.     hydrOXYzine (ATARAX/VISTARIL) 25 MG tablet Take 25-50 mg by mouth 3 (three) times daily.     naloxone (NARCAN) nasal spray 4 mg/0.1 mL Place 1 spray into the nose once as needed (opioid reversal).     ondansetron (ZOFRAN-ODT) 4 MG disintegrating tablet TAKE 1 TABLET (4 MG TOTAL) BY MOUTH EVERY 6 (SIX) HOURS AS NEEDED FOR NAUSEA  OR VOMITING 60 tablet 1   oxyCODONE-acetaminophen (PERCOCET/ROXICET) 5-325 MG tablet Take 1 tablet by mouth in the morning, at noon, and at bedtime.     pantoprazole (PROTONIX) 40 MG tablet Take 1 tablet (40 mg total) by mouth 2 (two) times daily. (Patient taking differently: Take 40 mg by mouth daily.) 30 tablet 3   PARoxetine (PAXIL) 20 MG tablet Take 20 mg by mouth at bedtime.     polyvinyl alcohol (LIQUIFILM TEARS) 1.4 % ophthalmic solution Place 1 drop into both eyes as needed for dry eyes.     QUEtiapine (SEROQUEL) 200 MG tablet Take 200 mg by mouth at bedtime.     traZODone (DESYREL) 50 MG tablet Take 50-100 mg by mouth at bedtime.     amLODipine (NORVASC) 5 MG tablet Take 1 tablet (5 mg total) by mouth daily. 30 tablet 3   dronabinol (MARINOL) 2.5 MG capsule TAKE TWO CAPSULES BY MOUTH EVERY MORNING AND 1 CAPSULE EVERY EVENING (Patient not taking: Reported on 06/16/2021) 90 capsule 0   levothyroxine (SYNTHROID) 25 MCG tablet Take 1 tablet (25 mcg total) by mouth daily before breakfast. (Patient not taking: Reported on 06/16/2021) 30 tablet 3   nicotine polacrilex (NICORETTE) 2 MG gum Take 1 each (2 mg total) by mouth as needed for smoking cessation. (Patient not taking: Reported on 06/16/2021) 100 tablet 0   promethazine (PHENERGAN) 25 MG tablet Take 1 tablet (25 mg total) by mouth 3 (three) times daily as needed. (Patient not taking: Reported on 06/16/2021) 30 tablet 1   sucralfate (CARAFATE) 1 GM/10ML suspension Take 10 mLs (1 g total) by mouth 4 (four) times daily -  with meals and at bedtime. (Patient not taking: Reported on 06/16/2021) 420 mL 1   Sulfamethoxazole, Morphine and related, Xanax xr [alprazolam er], Morphine and related, and Sulfa antibiotics Family History  Problem Relation Age of Onset   Clotting disorder Mother    Crohn's disease Mother    Heart disease Mother    Emphysema Mother    Heart disease Father    Breast cancer Maternal Aunt    Colon cancer Neg Hx    Stomach cancer  Neg Hx    Rectal cancer Neg Hx    Esophageal cancer Neg Hx    Social History:   reports that she has been smoking cigarettes. She has been smoking an average of .5 packs per day. She has never used smokeless tobacco. She reports that she does not currently use alcohol. She reports that she does not use drugs.   REVIEW OF SYSTEMS : Negative except for see long problem list  Physical Exam:   There were no vitals taken for  this visit. There is no height or weight on file to calculate BMI.  Gen:  Thin WF NAD  Neurological: Alert and oriented to person, place, and time. Motor and sensory function is grossly intact  Head: Normocephalic and atraumatic.  Eyes: Conjunctivae are normal. Pupils are equal, round, and reactive to light. No scleral icterus.  Neck: Normal range of motion. Neck supple. No tracheal deviation or thyromegaly present.  Cardiovascular:  SR without murmurs or gallops.  No carotid bruits Breast:  not evaluatd Respiratory: Effort normal.  No respiratory distress. No chest wall tenderness. Breath sounds normal.  No wheezes, rales or rhonchi.  Abdomen:  excoriation around jejunostomy site GU:  not examined Musculoskeletal: Normal range of motion. Extremities are nontender. No cyanosis, edema or clubbing noted Lymphadenopathy: No cervical, preauricular, postauricular or axillary adenopathy is present Skin: Skin is warm and dry. No rash noted. No diaphoresis. No erythema. No pallor. Pscyh: Normal mood and affect. Behavior is normal. Judgment and thought content normal.   LABORATORY RESULTS: No results found for this or any previous visit (from the past 48 hour(s)).   RADIOLOGY RESULTS: No results found.  Problem List: Patient Active Problem List   Diagnosis Date Noted   Pulmonary fibrosis ([place]) 02/13/2021   Altered mental status    Acute encephalopathy 01/14/2021   COPD (chronic obstructive pulmonary disease) ([place])    Hypothyroidism    Acute respiratory failure with  hypoxia ([place]) 01/03/2021   Normochromic normocytic anemia 01/03/2021   Community acquired pneumonia    Hypoxia    Abdominal pain    Protein-calorie malnutrition, severe 08/11/2019   Malnutrition ([place]) 08/09/2019   Nausea and vomiting    Regurgitation of food    Aperistalsis of esophagus    Calcaneus fracture, right 06/19/2017   Chronic pain 06/19/2017   Closed fracture of right distal tibia 06/17/2017   Anemia 09/05/2015   GERD (gastroesophageal reflux disease) 09/05/2015   IBS (irritable bowel syndrome) 09/05/2015   Depression 09/05/2015   Chronic interstitial cystitis 09/05/2015   Iron deficiency anemia 09/05/2015    Assessment & Plan: Failure of jejunostomy to close.  Need to close surgically.  This is a loop Witzel'ed when it was placed.    Plan lap assisted takedown of this jejunostomy    [name] Done, MD, [facility] [phone] beeper [phone]  06/24/2021 7:19 AM

## 2021-06-24 NOTE — Anesthesia Procedure Notes (Addendum)
Procedure Name: Intubation Date/Time: 06/24/2021 10:34 AM  Performed by: Maxwell Caul, CRNAPre-anesthesia Checklist: Patient identified, Emergency Drugs available, Suction available and Patient being monitored Patient Re-evaluated:Patient Re-evaluated prior to induction Oxygen Delivery Method: Circle system utilized Preoxygenation: Pre-oxygenation with 100% oxygen Induction Type: IV induction Ventilation: Mask ventilation without difficulty Laryngoscope Size: Mac and 3 Grade View: Grade I Tube type: Oral Tube size: 6.0 mm Number of attempts: 1 Airway Equipment and Method: Stylet Placement Confirmation: ETT inserted through vocal cords under direct vision, positive ETCO2 and breath sounds checked- equal and bilateral Secured at: 23 cm Tube secured with: Tape Dental Injury: Teeth and Oropharynx as per pre-operative assessment  Comments: Intubation by Cornell Barman, EMT.

## 2021-06-24 NOTE — Anesthesia Preprocedure Evaluation (Addendum)
Anesthesia Evaluation  Patient identified by MRN, date of birth, ID band Patient awake    Reviewed: Allergy & Precautions, NPO status , Patient's Chart, lab work & pertinent test results  Airway Mallampati: II  TM Distance: >3 FB Neck ROM: Full    Dental  (+) Teeth Intact, Dental Advisory Given   Pulmonary COPD, Current Smoker and Patient abstained from smoking.,    Pulmonary exam normal breath sounds clear to auscultation       Cardiovascular negative cardio ROS Normal cardiovascular exam Rhythm:Regular Rate:Normal     Neuro/Psych PSYCHIATRIC DISORDERS Anxiety Depression negative neurological ROS     GI/Hepatic Neg liver ROS, hiatal hernia, PUD, GERD  Medicated,JEJUNOSTOMY FISTULA   Endo/Other  Hypothyroidism   Renal/GU negative Renal ROS Bladder dysfunction      Musculoskeletal  (+) Fibromyalgia -  Abdominal   Peds  Hematology  (+) Blood dyscrasia, anemia ,   Anesthesia Other Findings Day of surgery medications reviewed with the patient.  Reproductive/Obstetrics                           Anesthesia Physical Anesthesia Plan  ASA: 2  Anesthesia Plan: General   Post-op Pain Management: Tylenol PO (pre-op)*   Induction: Intravenous  PONV Risk Score and Plan: 3 and Midazolam, Dexamethasone, Ondansetron and Scopolamine patch - Pre-op  Airway Management Planned: Oral ETT  Additional Equipment:   Intra-op Plan:   Post-operative Plan: Extubation in OR  Informed Consent: I have reviewed the patients History and Physical, chart, labs and discussed the procedure including the risks, benefits and alternatives for the proposed anesthesia with the patient or authorized representative who has indicated his/her understanding and acceptance.     Dental advisory given  Plan Discussed with: CRNA  Anesthesia Plan Comments:        Anesthesia Quick Evaluation

## 2021-06-25 ENCOUNTER — Encounter (HOSPITAL_COMMUNITY): Payer: Self-pay | Admitting: Surgery

## 2021-06-25 MED ORDER — HYDROXYZINE HCL 25 MG PO TABS
25.0000 mg | ORAL_TABLET | Freq: Three times a day (TID) | ORAL | Status: DC | PRN
  Administered 2021-06-25: 25 mg via ORAL
  Filled 2021-06-25: qty 1

## 2021-06-25 NOTE — Discharge Summary (Signed)
Physician Discharge Summary  Patient ID: LEIGH BLAS MRN: [id number] DOB/AGE: 03/06/1970 51 y.o.  PCP: Cyndi Bender, PA-C  Admit date: 06/24/2021 Discharge date: 06/25/2021  Admission Diagnoses:  irritation around jejunostomy that failed to close  Discharge Diagnoses:  same  Principal Problem:   S/P small bowel resection   Surgery:  lap assisted takedown of feeding jejunostomy  Discharged Condition: stable  Hospital Course:   Had surgery on Tuesday.  Kept overnight for hydration and ready for discharge on Wednesday morning  Consults: none  Significant Diagnostic Studies: none    Discharge Exam: Blood pressure 131/87, pulse 72, temperature 98.2 F (36.8 C), temperature source Oral, resp. rate 16, weight 33.6 kg, SpO2 90 %. Incisions closed with dermabond and wick in left upper quadrant incision  Disposition: Discharge disposition: 01-Home or Self Care       Discharge Instructions     Call MD for:  persistant nausea and vomiting   Complete by: As directed    Call MD for:  redness, tenderness, or signs of infection (pain, swelling, redness, odor or green/yellow discharge around incision site)   Complete by: As directed    Diet full liquid   Complete by: As directed    Discharge instructions   Complete by: As directed    Remove "wick" in your right upper quadrant wound on Friday--then dress with saline soaked gauze daily   Increase activity slowly   Complete by: As directed       Allergies as of 06/25/2021       Reactions   Sulfamethoxazole Anaphylaxis   Morphine And Related Other (See Comments)   Delirium. Makes me go "crazy"   Xanax Xr [alprazolam Er] Other (See Comments)   Enhanced depression; feels gloomy   Dilaudid [hydromorphone] Nausea And Vomiting   "Severe vomiting "    Morphine And Related    Sulfa Antibiotics         Medication List     TAKE these medications    acetaminophen 325 MG tablet Commonly known as: TYLENOL Take 650 mg by  mouth every 6 (six) hours as needed for moderate pain.   albuterol 108 (90 Base) MCG/ACT inhaler Commonly known as: VENTOLIN HFA Inhale 1-2 puffs into the lungs 4 (four) times daily as needed for shortness of breath or wheezing.   amLODipine 5 MG tablet Commonly known as: NORVASC Take 1 tablet (5 mg total) by mouth daily.   BC HEADACHE PO Take 1 packet by mouth daily as needed (pain/headache).   busPIRone 15 MG tablet Commonly known as: BUSPAR Take 15 mg by mouth 2 (two) times daily.   cyclobenzaprine 5 MG tablet Commonly known as: FLEXERIL Take 5 mg by mouth 3 (three) times daily.   dronabinol 2.5 MG capsule Commonly known as: MARINOL TAKE TWO CAPSULES BY MOUTH EVERY MORNING AND 1 CAPSULE EVERY EVENING   fentaNYL 12 MCG/HR Commonly known as: [place] 1 patch onto the skin every 3 (three) days.   gabapentin 800 MG tablet Commonly known as: NEURONTIN Take 800 mg by mouth 3 (three) times daily.   hydrOXYzine 25 MG tablet Commonly known as: ATARAX Take 25-50 mg by mouth 3 (three) times daily.   levothyroxine 25 MCG tablet Commonly known as: Synthroid Take 1 tablet (25 mcg total) by mouth daily before breakfast.   naloxone 4 MG/0.1ML Liqd nasal spray kit Commonly known as: NARCAN Place 1 spray into the nose once as needed (opioid reversal).   nicotine polacrilex 2 MG gum Commonly  known as: NICORETTE Take 1 each (2 mg total) by mouth as needed for smoking cessation.   ondansetron 4 MG disintegrating tablet Commonly known as: ZOFRAN-ODT TAKE 1 TABLET (4 MG TOTAL) BY MOUTH EVERY 6 (SIX) HOURS AS NEEDED FOR NAUSEA OR VOMITING   oxyCODONE-acetaminophen 5-325 MG tablet Commonly known as: PERCOCET/ROXICET Take 1 tablet by mouth in the morning, at noon, and at bedtime.   pantoprazole 40 MG tablet Commonly known as: Protonix Take 1 tablet (40 mg total) by mouth 2 (two) times daily. What changed: when to take this   PARoxetine 20 MG tablet Commonly known as:  PAXIL Take 20 mg by mouth at bedtime.   polyvinyl alcohol 1.4 % ophthalmic solution Commonly known as: LIQUIFILM TEARS Place 1 drop into both eyes as needed for dry eyes.   promethazine 25 MG tablet Commonly known as: PHENERGAN Take 1 tablet (25 mg total) by mouth 3 (three) times daily as needed.   QUEtiapine 200 MG tablet Commonly known as: SEROQUEL Take 200 mg by mouth at bedtime.   sucralfate 1 GM/10ML suspension Commonly known as: CARAFATE Take 10 mLs (1 g total) by mouth 4 (four) times daily -  with meals and at bedtime.   traZODone 50 MG tablet Commonly known as: DESYREL Take 50-100 mg by mouth at bedtime.        Follow-up Information     Johnathan Hausen, MD Follow up in 2 week(s).   Specialty: General Surgery Why: For routine follow up with Dr. Carter Kitten information: [address] [place] [phone]                 Signed: Pedro Earls 06/25/2021, 8:28 AM

## 2021-06-25 NOTE — Progress Notes (Signed)
Transition of Care [facility]) Screening Note  Patient Details  Name: [name] Date of Birth: [birth year]  Transition of [facility]) CM/SW Contact:    Sherie Don, LCSW Phone Number: 06/25/2021, 10:06 AM  Transition of Care Department [facility]) has reviewed patient and no TOC needs have been identified at this time. We will continue to monitor patient advancement through interdisciplinary progression rounds. If new patient transition needs arise, please place a TOC consult.

## 2021-07-25 ENCOUNTER — Other Ambulatory Visit (HOSPITAL_COMMUNITY): Payer: Self-pay | Admitting: Surgery

## 2021-07-25 DIAGNOSIS — K316 Fistula of stomach and duodenum: Secondary | ICD-10-CM

## 2021-07-25 DIAGNOSIS — E43 Unspecified severe protein-calorie malnutrition: Secondary | ICD-10-CM

## 2021-07-28 ENCOUNTER — Ambulatory Visit (HOSPITAL_COMMUNITY)
Admission: RE | Admit: 2021-07-28 | Discharge: 2021-07-28 | Disposition: A | Discharge status: Home / Self Care | Payer: Medicare Other | Source: Ambulatory Visit | Attending: Surgery | Admitting: Surgery

## 2021-07-28 DIAGNOSIS — K316 Fistula of stomach and duodenum: Secondary | ICD-10-CM | POA: Insufficient documentation

## 2021-07-28 DIAGNOSIS — E43 Unspecified severe protein-calorie malnutrition: Secondary | ICD-10-CM | POA: Diagnosis present

## 2021-07-28 LAB — CBC WITH DIFFERENTIAL/PLATELET
Abs Immature Granulocytes: 0.05 10*3/uL (ref 0.00–0.07)
Basophils Absolute: 0 10*3/uL (ref 0.0–0.1)
Basophils Relative: 0 %
Eosinophils Absolute: 0.3 10*3/uL (ref 0.0–0.5)
Eosinophils Relative: 2 %
HCT: 31.4 % — ABNORMAL LOW (ref 36.0–46.0)
Hemoglobin: 9.4 g/dL — ABNORMAL LOW (ref 12.0–15.0)
Immature Granulocytes: 1 %
Lymphocytes Relative: 29 %
Lymphs Abs: 3 10*3/uL (ref 0.7–4.0)
MCH: 25.2 pg — ABNORMAL LOW (ref 26.0–34.0)
MCHC: 29.9 g/dL — ABNORMAL LOW (ref 30.0–36.0)
MCV: 84.2 fL (ref 80.0–100.0)
Monocytes Absolute: 0.7 10*3/uL (ref 0.1–1.0)
Monocytes Relative: 6 %
Neutro Abs: 6.5 10*3/uL (ref 1.7–7.7)
Neutrophils Relative %: 62 %
Platelets: 281 10*3/uL (ref 150–400)
RBC: 3.73 MIL/uL — ABNORMAL LOW (ref 3.87–5.11)
RDW: 17.9 % — ABNORMAL HIGH (ref 11.5–15.5)
WBC: 10.6 10*3/uL — ABNORMAL HIGH (ref 4.0–10.5)
nRBC: 0 % (ref 0.0–0.2)

## 2021-07-28 LAB — COMPREHENSIVE METABOLIC PANEL
ALT: 8 U/L (ref 0–44)
AST: 12 U/L — ABNORMAL LOW (ref 15–41)
Albumin: 2.8 g/dL — ABNORMAL LOW (ref 3.5–5.0)
Alkaline Phosphatase: 82 U/L (ref 38–126)
Anion gap: 8 (ref 5–15)
BUN: 15 mg/dL (ref 6–20)
CO2: 22 mmol/L (ref 22–32)
Calcium: 8.2 mg/dL — ABNORMAL LOW (ref 8.9–10.3)
Chloride: 110 mmol/L (ref 98–111)
Creatinine, Ser: 0.63 mg/dL (ref 0.44–1.00)
GFR, Estimated: >60 mL/min (ref >60)
Glucose, Bld: 81 mg/dL (ref 70–99)
Potassium: 3.9 mmol/L (ref 3.5–5.1)
Sodium: 140 mmol/L (ref 135–145)
Total Bilirubin: 0.2 mg/dL — ABNORMAL LOW (ref 0.3–1.2)
Total Protein: 5.4 g/dL — ABNORMAL LOW (ref 6.5–8.1)

## 2021-07-28 LAB — MAGNESIUM: Magnesium: 2 mg/dL (ref 1.7–2.4)

## 2021-07-28 LAB — PHOSPHORUS: Phosphorus: 3.4 mg/dL (ref 2.5–4.6)

## 2021-07-28 LAB — TRIGLYCERIDES: Triglycerides: 195 mg/dL — ABNORMAL HIGH (ref <150)

## 2021-07-28 MED ORDER — LIDOCAINE HCL 1 % IJ SOLN
INTRAMUSCULAR | Status: AC
  Administered 2021-07-28: 10 mL
  Filled 2021-07-28: qty 20

## 2021-07-28 MED ORDER — HEPARIN SOD (PORK) LOCK FLUSH 100 UNIT/ML IV SOLN
INTRAVENOUS | Status: AC
  Filled 2021-07-28: qty 5

## 2021-07-28 NOTE — Procedures (Signed)
Successful placement of dual lumen PICC line to left basilic vein. Length 43cm Tip at lower SVC/RA PICC capped No complications Ready for use.  EBL < 5 mL   Emelly Wurtz PA-C 07/28/2021 2:57 PM

## 2021-07-29 ENCOUNTER — Telehealth: Payer: Self-pay | Admitting: Physician Assistant

## 2021-07-29 NOTE — Telephone Encounter (Signed)
  Patient called c/o pain in the arm where PICC was placed yesterday.  She denies bruising or swelling. She has good ROM.  Images reviewed and it appears to be in proper location.  I offered for her to come to the Radiology department for evaluation of her arm but she declined because she did not have transportation.  I did encourage her to take some OTC Advil/Tylenol and she can use and ice pack if needed.  I also reinforced that we are happy to see her and examine her arm.  She knows she can call us if she changes her mind.  She would like to stay out of work tomorrow because she is worried about lifting things with that arm until the pain has resolved.  I offered to write a Doctors Note for her absence from work. She isn't sure she needs one but if she does she will let us know and she will provide [place] with a fax number to send it.  Murrell Redden PA-C 07/29/2021 12:37 PM

## 2021-08-27 ENCOUNTER — Other Ambulatory Visit (HOSPITAL_COMMUNITY): Payer: Self-pay | Admitting: Surgery

## 2021-08-27 DIAGNOSIS — Z95828 Presence of other vascular implants and grafts: Secondary | ICD-10-CM

## 2021-08-29 ENCOUNTER — Encounter (HOSPITAL_COMMUNITY): Payer: Self-pay

## 2021-08-29 ENCOUNTER — Other Ambulatory Visit (HOSPITAL_COMMUNITY): Payer: Self-pay | Admitting: Surgery

## 2021-08-29 ENCOUNTER — Ambulatory Visit (HOSPITAL_COMMUNITY)
Admission: RE | Admit: 2021-08-29 | Discharge: 2021-08-29 | Disposition: A | Discharge status: Home / Self Care | Payer: Medicare Other | Source: Ambulatory Visit | Attending: Surgery | Admitting: Surgery

## 2021-08-29 DIAGNOSIS — Z452 Encounter for adjustment and management of vascular access device: Secondary | ICD-10-CM | POA: Insufficient documentation

## 2021-08-29 DIAGNOSIS — Z95828 Presence of other vascular implants and grafts: Secondary | ICD-10-CM

## 2021-08-29 HISTORY — PX: IR PATIENT EVAL TECH 0-60 MINS: IMG5564

## 2021-08-29 NOTE — Progress Notes (Signed)
Patient presents to IR today for LUE PICC evaluation due to pain at the insertion site. Per patient she has intermittent pain at the insertion site and had a fever of 100.5 earlier this week, she notes the site to be slightly red and may have some purulent drainage. She is requesting that the PICC be removed as she does not feel TPN is working and she does not like having to be hooked up to a machine. She denies any other pain in her arm, shoulder or chest. No numbness/tingling. She states she is eating and drinking well but does occasionally become nauseous and vomit with eating.   LUE PICC dressed appropriately, stat lock in place but catheter is not secured within the lock and is retracted approximately 2 cm. Both lumens flush/aspirate easily without discomfort. The insertion site has very mild erythema and small amount of yellow crusting on the catheter which does not appear to be frankly purulent and more closely resembles granulation tissue. Unable to express any purulence from the site, minimal pain with forceful palpation.   Discussed replacing current PICC, placing a right upper extremity PICC vs tunneled central catheter for ongoing TPN - patient adamantly states that she just wants the PICC removed, she does not want to try any other alternatives at this time. We discussed leaving the PICC in place and discussing with her surgeon before removal, she states she does not wish to do this and wants the PICC out as she is tired of having it and being hooked up to machines. She understands that she may require the PICC to be replaced at some point as she will need access for TPN, she states she does not want to continue TPN but will discuss this with Dr. Hassell Done. She adamantly requests the PICC be removed today. I gave her several minutes to contemplate this decision further after reviewing risks and alternatives but she is firm in her request to have the PICC removed today.  LUE PICC removed without  incident today per patient's request. Dressing applied and to remain x 24 hours then may remove and shower.  Message sent to Dr. Hassell Done so that he is aware. Patient was instructed to call his office today to discuss further plans for nutrition.   Please call IR with questions or concerns.  Candiss Norse, PA-C

## 2021-08-29 NOTE — Procedures (Signed)
Patient presented with complaint of some pain at site of PICC line insertion.  Candiss Norse, PA assessed the PICC line and the site and there was mild granulation tissue around insertion site, and PICC flushed and aspirated well.  After [name], [place] assessed the PICC line, patient stated she no longer wanted the PICC, she did not feel like it was helping her.  Before PICC line was removed, patient was counseled that her ordering provider may want her to have another PICC line placed in the future.  After patient verbalized her understanding of counseling, she still requested PICC line to be removed.  Candiss Norse, PA then removed PICC line and a new dressing was placed over site and patient was instructed to keep dressing clean, dry, and intact while PICC site healed.

## 2022-01-22 DIAGNOSIS — Z79891 Long term (current) use of opiate analgesic: Secondary | ICD-10-CM | POA: Diagnosis not present

## 2022-01-22 DIAGNOSIS — G894 Chronic pain syndrome: Secondary | ICD-10-CM | POA: Diagnosis not present

## 2022-01-22 DIAGNOSIS — M549 Dorsalgia, unspecified: Secondary | ICD-10-CM | POA: Diagnosis not present

## 2022-01-22 DIAGNOSIS — R109 Unspecified abdominal pain: Secondary | ICD-10-CM | POA: Diagnosis not present

## 2022-01-22 DIAGNOSIS — M4310 Spondylolisthesis, site unspecified: Secondary | ICD-10-CM | POA: Diagnosis not present

## 2022-01-22 DIAGNOSIS — N3010 Interstitial cystitis (chronic) without hematuria: Secondary | ICD-10-CM | POA: Diagnosis not present

## 2022-02-17 DIAGNOSIS — E43 Unspecified severe protein-calorie malnutrition: Secondary | ICD-10-CM | POA: Diagnosis not present

## 2022-02-17 DIAGNOSIS — R1110 Vomiting, unspecified: Secondary | ICD-10-CM | POA: Diagnosis not present

## 2022-02-17 DIAGNOSIS — Z8711 Personal history of peptic ulcer disease: Secondary | ICD-10-CM | POA: Diagnosis not present

## 2022-02-17 DIAGNOSIS — F17210 Nicotine dependence, cigarettes, uncomplicated: Secondary | ICD-10-CM | POA: Diagnosis not present

## 2022-02-17 DIAGNOSIS — K5900 Constipation, unspecified: Secondary | ICD-10-CM | POA: Diagnosis not present

## 2022-02-17 DIAGNOSIS — K219 Gastro-esophageal reflux disease without esophagitis: Secondary | ICD-10-CM | POA: Diagnosis not present

## 2022-02-26 DIAGNOSIS — G894 Chronic pain syndrome: Secondary | ICD-10-CM | POA: Diagnosis not present

## 2022-02-26 DIAGNOSIS — N3010 Interstitial cystitis (chronic) without hematuria: Secondary | ICD-10-CM | POA: Diagnosis not present

## 2022-02-26 DIAGNOSIS — M4310 Spondylolisthesis, site unspecified: Secondary | ICD-10-CM | POA: Diagnosis not present

## 2022-02-26 DIAGNOSIS — Z79891 Long term (current) use of opiate analgesic: Secondary | ICD-10-CM | POA: Diagnosis not present

## 2022-02-26 DIAGNOSIS — M549 Dorsalgia, unspecified: Secondary | ICD-10-CM | POA: Diagnosis not present

## 2022-02-26 DIAGNOSIS — R109 Unspecified abdominal pain: Secondary | ICD-10-CM | POA: Diagnosis not present

## 2022-03-17 DIAGNOSIS — K3184 Gastroparesis: Secondary | ICD-10-CM | POA: Diagnosis not present

## 2022-03-17 DIAGNOSIS — Z79899 Other long term (current) drug therapy: Secondary | ICD-10-CM | POA: Diagnosis not present

## 2022-03-17 DIAGNOSIS — F32A Depression, unspecified: Secondary | ICD-10-CM | POA: Diagnosis not present

## 2022-03-17 DIAGNOSIS — M549 Dorsalgia, unspecified: Secondary | ICD-10-CM | POA: Diagnosis not present

## 2022-03-17 DIAGNOSIS — D509 Iron deficiency anemia, unspecified: Secondary | ICD-10-CM | POA: Diagnosis not present

## 2022-03-17 DIAGNOSIS — F17210 Nicotine dependence, cigarettes, uncomplicated: Secondary | ICD-10-CM | POA: Diagnosis not present

## 2022-03-17 DIAGNOSIS — E46 Unspecified protein-calorie malnutrition: Secondary | ICD-10-CM | POA: Diagnosis not present

## 2022-03-17 DIAGNOSIS — R911 Solitary pulmonary nodule: Secondary | ICD-10-CM | POA: Diagnosis not present

## 2022-03-17 DIAGNOSIS — N3010 Interstitial cystitis (chronic) without hematuria: Secondary | ICD-10-CM | POA: Diagnosis not present

## 2022-03-17 DIAGNOSIS — E43 Unspecified severe protein-calorie malnutrition: Secondary | ICD-10-CM | POA: Diagnosis not present

## 2022-03-17 DIAGNOSIS — Z8711 Personal history of peptic ulcer disease: Secondary | ICD-10-CM | POA: Diagnosis not present

## 2022-03-17 DIAGNOSIS — J449 Chronic obstructive pulmonary disease, unspecified: Secondary | ICD-10-CM | POA: Diagnosis not present

## 2022-03-17 DIAGNOSIS — Z903 Acquired absence of stomach [part of]: Secondary | ICD-10-CM | POA: Diagnosis not present

## 2022-03-17 DIAGNOSIS — G8929 Other chronic pain: Secondary | ICD-10-CM | POA: Diagnosis not present

## 2022-03-17 DIAGNOSIS — R627 Adult failure to thrive: Secondary | ICD-10-CM | POA: Diagnosis not present

## 2022-03-17 DIAGNOSIS — Z931 Gastrostomy status: Secondary | ICD-10-CM | POA: Diagnosis not present

## 2022-03-18 DIAGNOSIS — F32A Depression, unspecified: Secondary | ICD-10-CM | POA: Diagnosis not present

## 2022-03-18 DIAGNOSIS — N3010 Interstitial cystitis (chronic) without hematuria: Secondary | ICD-10-CM | POA: Diagnosis not present

## 2022-03-18 DIAGNOSIS — R627 Adult failure to thrive: Secondary | ICD-10-CM | POA: Diagnosis not present

## 2022-03-18 DIAGNOSIS — K3184 Gastroparesis: Secondary | ICD-10-CM | POA: Diagnosis not present

## 2022-03-18 DIAGNOSIS — M549 Dorsalgia, unspecified: Secondary | ICD-10-CM | POA: Diagnosis not present

## 2022-03-18 DIAGNOSIS — F17210 Nicotine dependence, cigarettes, uncomplicated: Secondary | ICD-10-CM | POA: Diagnosis not present

## 2022-03-18 DIAGNOSIS — G8929 Other chronic pain: Secondary | ICD-10-CM | POA: Diagnosis not present

## 2022-03-18 DIAGNOSIS — Z934 Other artificial openings of gastrointestinal tract status: Secondary | ICD-10-CM | POA: Diagnosis not present

## 2022-03-18 DIAGNOSIS — R911 Solitary pulmonary nodule: Secondary | ICD-10-CM | POA: Diagnosis not present

## 2022-03-18 DIAGNOSIS — J449 Chronic obstructive pulmonary disease, unspecified: Secondary | ICD-10-CM | POA: Diagnosis not present

## 2022-03-18 DIAGNOSIS — Z79899 Other long term (current) drug therapy: Secondary | ICD-10-CM | POA: Diagnosis not present

## 2022-03-18 DIAGNOSIS — Z4659 Encounter for fitting and adjustment of other gastrointestinal appliance and device: Secondary | ICD-10-CM | POA: Diagnosis not present

## 2022-03-18 DIAGNOSIS — Z931 Gastrostomy status: Secondary | ICD-10-CM | POA: Diagnosis not present

## 2022-03-18 DIAGNOSIS — Z903 Acquired absence of stomach [part of]: Secondary | ICD-10-CM | POA: Diagnosis not present

## 2022-03-18 DIAGNOSIS — D509 Iron deficiency anemia, unspecified: Secondary | ICD-10-CM | POA: Diagnosis not present

## 2022-03-18 DIAGNOSIS — E43 Unspecified severe protein-calorie malnutrition: Secondary | ICD-10-CM | POA: Diagnosis not present

## 2022-03-18 DIAGNOSIS — Z8711 Personal history of peptic ulcer disease: Secondary | ICD-10-CM | POA: Diagnosis not present

## 2022-03-19 DIAGNOSIS — R911 Solitary pulmonary nodule: Secondary | ICD-10-CM | POA: Diagnosis not present

## 2022-03-19 DIAGNOSIS — R932 Abnormal findings on diagnostic imaging of liver and biliary tract: Secondary | ICD-10-CM | POA: Diagnosis not present

## 2022-03-19 DIAGNOSIS — Z934 Other artificial openings of gastrointestinal tract status: Secondary | ICD-10-CM | POA: Diagnosis not present

## 2022-03-19 DIAGNOSIS — E43 Unspecified severe protein-calorie malnutrition: Secondary | ICD-10-CM | POA: Diagnosis not present

## 2022-03-19 DIAGNOSIS — R590 Localized enlarged lymph nodes: Secondary | ICD-10-CM | POA: Diagnosis not present

## 2022-03-19 DIAGNOSIS — M549 Dorsalgia, unspecified: Secondary | ICD-10-CM | POA: Diagnosis not present

## 2022-03-19 DIAGNOSIS — F17210 Nicotine dependence, cigarettes, uncomplicated: Secondary | ICD-10-CM | POA: Diagnosis not present

## 2022-03-19 DIAGNOSIS — Z903 Acquired absence of stomach [part of]: Secondary | ICD-10-CM | POA: Diagnosis not present

## 2022-03-19 DIAGNOSIS — Z8711 Personal history of peptic ulcer disease: Secondary | ICD-10-CM | POA: Diagnosis not present

## 2022-03-19 DIAGNOSIS — Z931 Gastrostomy status: Secondary | ICD-10-CM | POA: Diagnosis not present

## 2022-03-19 DIAGNOSIS — N3010 Interstitial cystitis (chronic) without hematuria: Secondary | ICD-10-CM | POA: Diagnosis not present

## 2022-03-19 DIAGNOSIS — Z79899 Other long term (current) drug therapy: Secondary | ICD-10-CM | POA: Diagnosis not present

## 2022-03-19 DIAGNOSIS — R627 Adult failure to thrive: Secondary | ICD-10-CM | POA: Diagnosis not present

## 2022-03-19 DIAGNOSIS — D509 Iron deficiency anemia, unspecified: Secondary | ICD-10-CM | POA: Diagnosis not present

## 2022-03-19 DIAGNOSIS — G8929 Other chronic pain: Secondary | ICD-10-CM | POA: Diagnosis not present

## 2022-03-19 DIAGNOSIS — F32A Depression, unspecified: Secondary | ICD-10-CM | POA: Diagnosis not present

## 2022-03-19 DIAGNOSIS — J449 Chronic obstructive pulmonary disease, unspecified: Secondary | ICD-10-CM | POA: Diagnosis not present

## 2022-03-19 DIAGNOSIS — K3184 Gastroparesis: Secondary | ICD-10-CM | POA: Diagnosis not present

## 2022-03-26 DIAGNOSIS — N3010 Interstitial cystitis (chronic) without hematuria: Secondary | ICD-10-CM | POA: Diagnosis not present

## 2022-03-26 DIAGNOSIS — M549 Dorsalgia, unspecified: Secondary | ICD-10-CM | POA: Diagnosis not present

## 2022-03-26 DIAGNOSIS — G894 Chronic pain syndrome: Secondary | ICD-10-CM | POA: Diagnosis not present

## 2022-03-26 DIAGNOSIS — M4310 Spondylolisthesis, site unspecified: Secondary | ICD-10-CM | POA: Diagnosis not present

## 2022-03-26 DIAGNOSIS — Z79891 Long term (current) use of opiate analgesic: Secondary | ICD-10-CM | POA: Diagnosis not present

## 2022-03-26 DIAGNOSIS — R109 Unspecified abdominal pain: Secondary | ICD-10-CM | POA: Diagnosis not present

## 2022-03-30 ENCOUNTER — Telehealth: Payer: Self-pay | Admitting: Student

## 2022-03-30 NOTE — Telephone Encounter (Signed)
Please try both home and cell if needed and OK to leave message. I told her to pick up any and all incoming calls.

## 2022-03-30 NOTE — Telephone Encounter (Signed)
Called and spoke with pt. Scheduled her an appt with NM 3/20. Nothing further needed.

## 2022-03-30 NOTE — Telephone Encounter (Signed)
PT calling for FU on last encounter w/Dr. Verlee Monte. Has not heard back and is "a nervous wreck". Pls call to advise @ [phone]

## 2022-03-31 NOTE — Progress Notes (Deleted)
Synopsis: Referred for abnormal CT Chest by Cyndi Bender, PA-C  Subjective:   PATIENT ID: [name] GENDER: female DOB: 08-27-1970, MRN: [id number]  No chief complaint on file.  51yF with smoking, emphysema and fibrotic lung disease which may be due to DIP vs recurrent aspiration followed previously by Dr. Carlis Abbott  She says she's ok overall but she does note chest tightness over last couple days, some in left arm - a couple days ago which has self-resolved - this occurred at the end of a long day working at [employer] but started when she was at rest and was relieved with rest. Not much of a cough.   She still wakes at night frequently with sensation of something in her throat. Needed to get up to vomit last night after she tried to eat some solid food.   She says she doesn't walk enough to really recognize if/when she gets onset of DOE.   Still smoking, down to half ppd. Chantix 'made me crazy.'  Otherwise pertinent review of systems is negative.  Past Medical History:  Diagnosis Date   Allergic rhinitis    Anemia    Anxiety    Anxiety disorder    Aspiration into airway    per pt hx on 08/07/19 aspirates food into lungs when asleep    Calcaneus fracture, right 06/19/2017   Chronic interstitial cystitis    Chronic pain 06/19/2017   COPD (chronic obstructive pulmonary disease) (HCC)    Depression    Fibromyalgia    Gastric outlet obstruction    Gastric ulcer    GERD (gastroesophageal reflux disease)    Hiatal hernia    History of kidney stones    Hypothyroidism    in the past   IBS (irritable bowel syndrome)    IDA (iron deficiency anemia)    Internal hemorrhoids    Kidney stones    Myalgia    OCD (obsessive compulsive disorder)    Pneumonia    Ulcerative esophagitis    Vitamin D deficiency      Family History  Problem Relation Age of Onset   Clotting disorder Mother    Crohn's disease Mother    Heart disease Mother    Emphysema Mother    Heart disease  Father    Breast cancer Maternal Aunt    Colon cancer Neg Hx    Stomach cancer Neg Hx    Rectal cancer Neg Hx    Esophageal cancer Neg Hx      Past Surgical History:  Procedure Laterality Date   107 HOUR [place] STUDY N/A 03/15/2019   Procedure: 24 HOUR [place] STUDY;  Surgeon: Jerene Bears, MD;  [facility];  Service: Gastroenterology;  Laterality: N/A;   ABDOMINAL HYSTERECTOMY     BILROTH I PROCEDURE  06/22/2016   Dr Pauletta Browns---- due to gastric ulcer   ESOPHAGEAL MANOMETRY N/A 03/15/2019   Procedure: ESOPHAGEAL MANOMETRY (EM);  Surgeon: Jerene Bears, MD;  [facility];  Service: Gastroenterology;  Laterality: N/A;   EXTERNAL EAR SURGERY     dont know specific ear sugery (inner/external)   FRACTURE SURGERY     right arm   gastrectomy and bilateral vagotomy      IR CM INJ ANY COLONIC TUBE W/FLUORO  12/30/2020   IR PATIENT EVAL TECH 0-60 MINS  08/29/2021   IR REPLC DUODEN/JEJUNO TUBE PERCUT W/FLUORO  09/15/2019   IR REPLC DUODEN/JEJUNO TUBE PERCUT W/FLUORO  07/23/2020   IR REPLC DUODEN/JEJUNO TUBE PERCUT  W/FLUORO  09/23/2020   IR REPLC DUODEN/JEJUNO TUBE PERCUT W/FLUORO  12/27/2020   IR SINUS/FIST TUBE CHK-NON GI  03/14/2020   IR SINUS/FIST TUBE CHK-NON GI  06/05/2021   LAPAROSCOPIC JEJUNOSTOMY N/A 08/09/2019   Procedure: LAPAROSCOPIC FEEDING JEJUNOSTOMY;  Surgeon: Johnathan Hausen, MD;  [facility];  Service: General;  Laterality: N/A;   LAPAROSCOPIC JEJUNOSTOMY N/A 06/24/2021   Procedure: LAPAROSCOPIC TAKEDOWN OF FEEDING JEJUNOSTOMY;  Surgeon: Johnathan Hausen, MD;  Location[facility];  Service: General;  Laterality: N/A;   mutliple surgeries due to intertitial cystitis     bladder stimulator   ORIF TIBIA FRACTURE Right 06/17/2017   Procedure: OPEN REDUCTION INTERNAL FIXATION (ORIF) DISTAL TIBIA FRACTURE AND CALCANEOUS;  Surgeon: Altamese Reno, MD;  Location: [place];  Service: Orthopedics;  Laterality: Right;   OTHER SURGICAL HISTORY     TENS implantation for Chronic Interstitial  Cystitis   PARTIAL GASTRECTOMY      Social History   Socioeconomic History   Marital status: Divorced    Spouse name: Not on file   Number of children: 1   Years of education: 16   Highest education level: Not on file  Occupational History   Occupation: Tourist information centre manager: [employer]  Tobacco Use   Smoking status: Every Day    Packs/day: .5    Types: Cigarettes   Smokeless tobacco: Never   Tobacco comments:    10 cigarettes smoked daily ARJ 01/23/21  Vaping Use   Vaping Use: Never used  Substance and Sexual Activity   Alcohol use: Not Currently   Drug use: Never   Sexual activity: Never  Other Topics Concern   Not on file  Social History Narrative   ** Merged History Encounter **       Social Determinants of Health   Financial Resource Strain: Not on file  Food Insecurity: Not on file  Transportation Needs: Not on file  Physical Activity: Not on file  Stress: Not on file  Social Connections: Not on file  Intimate Partner Violence: Not on file     Allergies  Allergen Reactions   Sulfamethoxazole Anaphylaxis   Morphine And Related Other (See Comments)    Delirium. Makes me go "crazy"   Xanax Xr [Alprazolam Er] Other (See Comments)    Enhanced depression; feels gloomy   Dilaudid [Hydromorphone] Nausea And Vomiting    "Severe vomiting "    Morphine And Related    Sulfa Antibiotics      Outpatient Medications Prior to Visit  Medication Sig Dispense Refill   acetaminophen (TYLENOL) 325 MG tablet Take 650 mg by mouth every 6 (six) hours as needed for moderate pain.     albuterol (VENTOLIN HFA) 108 (90 Base) MCG/ACT inhaler Inhale 1-2 puffs into the lungs 4 (four) times daily as needed for shortness of breath or wheezing.     amLODipine (NORVASC) 5 MG tablet Take 1 tablet (5 mg total) by mouth daily. 30 tablet 3   Aspirin-Salicylamide-Caffeine (BC HEADACHE PO) Take 1 packet by mouth daily as needed (pain/headache).      busPIRone (BUSPAR) 15 MG tablet Take 15  mg by mouth 2 (two) times daily.     cyclobenzaprine (FLEXERIL) 5 MG tablet Take 5 mg by mouth 3 (three) times daily.     dronabinol (MARINOL) 2.5 MG capsule TAKE TWO CAPSULES BY MOUTH EVERY MORNING AND 1 CAPSULE EVERY EVENING (Patient not taking: Reported on 06/16/2021) 90 capsule 0   fentaNYL (DURAGESIC) 12 MCG/HR Place  1 patch onto the skin every 3 (three) days.     gabapentin (NEURONTIN) 800 MG tablet Take 800 mg by mouth 3 (three) times daily.     hydrOXYzine (ATARAX/VISTARIL) 25 MG tablet Take 25-50 mg by mouth 3 (three) times daily.     levothyroxine (SYNTHROID) 25 MCG tablet Take 1 tablet (25 mcg total) by mouth daily before breakfast. (Patient not taking: Reported on 06/16/2021) 30 tablet 3   naloxone (NARCAN) nasal spray 4 mg/0.1 mL Place 1 spray into the nose once as needed (opioid reversal).     nicotine polacrilex (NICORETTE) 2 MG gum Take 1 each (2 mg total) by mouth as needed for smoking cessation. (Patient not taking: Reported on 06/16/2021) 100 tablet 0   ondansetron (ZOFRAN-ODT) 4 MG disintegrating tablet TAKE 1 TABLET (4 MG TOTAL) BY MOUTH EVERY 6 (SIX) HOURS AS NEEDED FOR NAUSEA OR VOMITING 60 tablet 1   oxyCODONE-acetaminophen (PERCOCET/ROXICET) 5-325 MG tablet Take 1 tablet by mouth in the morning, at noon, and at bedtime.     pantoprazole (PROTONIX) 40 MG tablet Take 1 tablet (40 mg total) by mouth 2 (two) times daily. (Patient taking differently: Take 40 mg by mouth daily.) 30 tablet 3   PARoxetine (PAXIL) 20 MG tablet Take 20 mg by mouth at bedtime.     polyvinyl alcohol (LIQUIFILM TEARS) 1.4 % ophthalmic solution Place 1 drop into both eyes as needed for dry eyes.     promethazine (PHENERGAN) 25 MG tablet Take 1 tablet (25 mg total) by mouth 3 (three) times daily as needed. (Patient not taking: Reported on 06/16/2021) 30 tablet 1   QUEtiapine (SEROQUEL) 200 MG tablet Take 200 mg by mouth at bedtime.     sucralfate (CARAFATE) 1 GM/10ML suspension Take 10 mLs (1 g total) by mouth 4  (four) times daily -  with meals and at bedtime. (Patient not taking: Reported on 06/16/2021) 420 mL 1   traZODone (DESYREL) 50 MG tablet Take 50-100 mg by mouth at bedtime.     No facility-administered medications prior to visit.       Objective:   Physical Exam:  General appearance: 53 y.o., female, NAD, frail, thin Eyes: anicteric sclerae; PERRL, tracking appropriately HENT: NCAT; MMM Neck: Trachea midline; no lymphadenopathy, no JVD Lungs: bronchial breath sounds bl, no crackles, no wheeze, with normal respiratory effort CV: RRR, no murmur  Abdomen: Soft, non-tender; non-distended, BS present  Extremities: No peripheral edema, warm Skin: Normal turgor and texture; no rash Psych: Appropriate affect Neuro: Alert and oriented to person and place, no focal deficit     There were no vitals filed for this visit.    on RA BMI Readings from Last 3 Encounters:  06/24/21 14.95 kg/m  06/19/21 14.95 kg/m  02/27/21 14.70 kg/m   Wt Readings from Last 3 Encounters:  06/24/21 74 lb (33.6 kg)  06/19/21 74 lb (33.6 kg)  02/27/21 74 lb (33.6 kg)     CBC    Component Value Date/Time   WBC 10.6 (H) 07/28/2021 1447   RBC 3.73 (L) 07/28/2021 1447   HGB 9.4 (L) 07/28/2021 1447   HGB 7.4 (L) 09/05/2015 1350   HCT 31.4 (L) 07/28/2021 1447   HCT 27.1 (L) 09/05/2015 1350   PLT 281 07/28/2021 1447   PLT 241 09/05/2015 1350   MCV 84.2 07/28/2021 1447   MCV 71.9 (L) 09/05/2015 1350   MCH 25.2 (L) 07/28/2021 1447   MCHC 29.9 (L) 07/28/2021 1447   RDW 17.9 (H) 07/28/2021 1447  RDW 20.3 (H) 09/05/2015 1350   LYMPHSABS 3.0 07/28/2021 1447   LYMPHSABS 3.0 09/05/2015 1350   MONOABS 0.7 07/28/2021 1447   MONOABS 0.9 09/05/2015 1350   EOSABS 0.3 07/28/2021 1447   EOSABS 0.2 09/05/2015 1350   BASOSABS 0.0 07/28/2021 1447   BASOSABS 0.0 09/05/2015 1350     Chest Imaging: CTA Chest 12/28 reviewed by me remarkable for emphysema and widespread ggo, mediastinal LAD and patulous  esophagus  CXR reviewed by me today with  3.6cm rounded opacity left mid lung field, reticular opacities on right.  CT Chest 03/19/22 with interval development of 1.6cm RML nodule along minor fissure - may be interval growth of 48mm lesion seen on ct chest 02/2021 previously felt to represent LN  Pulmonary Functions Testing Results:    Latest Ref Rng & Units 02/27/2021   11:12 AM  PFT Results  FVC-Pre L 1.91   FVC-Predicted Pre % 63   Pre FEV1/FVC % % 80   FEV1-Pre L 1.54   FEV1-Predicted Pre % 63   DLCO uncorrected ml/min/mmHg 8.02   DLCO UNC% % 44   DLCO corrected ml/min/mmHg 8.44   DLCO COR %Predicted % 46   DLVA Predicted % 59       Echocardiogram:   TTE 01/03/21 mild mod MR, TR      Assessment & Plan:   # ILD: May be multifactorial due to recurrent aspiration, possibly with superimposed DIP due to smoking.  # Left lung mass-like consolidation:  # 1.6cm RML nodule  Plan: - awaiting final read on CXR and while this would be rapid for development of malignancy, we may need to pursue CT Chest now - spirometry before next visit - counseled extensively on smoking cessation, especially given possibility of DIP. Prescribed gum for her to use with patches. Declines chantix due to mood disturbance in past.  - prevnar 20 today   RTC 3 months  Maryjane Hurter, MD [facility] 03/31/2022 11:04 AM

## 2022-04-01 ENCOUNTER — Ambulatory Visit: Payer: Medicare Other | Admitting: Student

## 2022-04-02 DIAGNOSIS — J449 Chronic obstructive pulmonary disease, unspecified: Secondary | ICD-10-CM | POA: Diagnosis not present

## 2022-04-02 DIAGNOSIS — R911 Solitary pulmonary nodule: Secondary | ICD-10-CM | POA: Diagnosis not present

## 2022-04-14 DIAGNOSIS — B370 Candidal stomatitis: Secondary | ICD-10-CM | POA: Diagnosis not present

## 2022-04-14 DIAGNOSIS — Z20822 Contact with and (suspected) exposure to covid-19: Secondary | ICD-10-CM | POA: Diagnosis not present

## 2022-04-14 DIAGNOSIS — R509 Fever, unspecified: Secondary | ICD-10-CM | POA: Diagnosis not present

## 2022-04-23 DIAGNOSIS — M4310 Spondylolisthesis, site unspecified: Secondary | ICD-10-CM | POA: Diagnosis not present

## 2022-04-23 DIAGNOSIS — Z79891 Long term (current) use of opiate analgesic: Secondary | ICD-10-CM | POA: Diagnosis not present

## 2022-04-23 DIAGNOSIS — R109 Unspecified abdominal pain: Secondary | ICD-10-CM | POA: Diagnosis not present

## 2022-04-23 DIAGNOSIS — G894 Chronic pain syndrome: Secondary | ICD-10-CM | POA: Diagnosis not present

## 2022-04-23 DIAGNOSIS — N3010 Interstitial cystitis (chronic) without hematuria: Secondary | ICD-10-CM | POA: Diagnosis not present

## 2022-04-23 DIAGNOSIS — M549 Dorsalgia, unspecified: Secondary | ICD-10-CM | POA: Diagnosis not present

## 2022-05-21 DIAGNOSIS — M4310 Spondylolisthesis, site unspecified: Secondary | ICD-10-CM | POA: Diagnosis not present

## 2022-05-21 DIAGNOSIS — G894 Chronic pain syndrome: Secondary | ICD-10-CM | POA: Diagnosis not present

## 2022-05-21 DIAGNOSIS — M549 Dorsalgia, unspecified: Secondary | ICD-10-CM | POA: Diagnosis not present

## 2022-05-21 DIAGNOSIS — N3010 Interstitial cystitis (chronic) without hematuria: Secondary | ICD-10-CM | POA: Diagnosis not present

## 2022-05-21 DIAGNOSIS — R109 Unspecified abdominal pain: Secondary | ICD-10-CM | POA: Diagnosis not present

## 2022-05-21 DIAGNOSIS — Z79891 Long term (current) use of opiate analgesic: Secondary | ICD-10-CM | POA: Diagnosis not present

## 2022-06-16 DIAGNOSIS — R109 Unspecified abdominal pain: Secondary | ICD-10-CM | POA: Diagnosis not present

## 2022-06-16 DIAGNOSIS — Z79891 Long term (current) use of opiate analgesic: Secondary | ICD-10-CM | POA: Diagnosis not present

## 2022-06-16 DIAGNOSIS — G894 Chronic pain syndrome: Secondary | ICD-10-CM | POA: Diagnosis not present

## 2022-06-16 DIAGNOSIS — M549 Dorsalgia, unspecified: Secondary | ICD-10-CM | POA: Diagnosis not present

## 2022-06-16 DIAGNOSIS — M4310 Spondylolisthesis, site unspecified: Secondary | ICD-10-CM | POA: Diagnosis not present

## 2022-06-16 DIAGNOSIS — N3010 Interstitial cystitis (chronic) without hematuria: Secondary | ICD-10-CM | POA: Diagnosis not present

## 2022-06-16 DIAGNOSIS — Z1389 Encounter for screening for other disorder: Secondary | ICD-10-CM | POA: Diagnosis not present

## 2022-06-23 DIAGNOSIS — R918 Other nonspecific abnormal finding of lung field: Secondary | ICD-10-CM | POA: Diagnosis not present

## 2022-06-23 DIAGNOSIS — E43 Unspecified severe protein-calorie malnutrition: Secondary | ICD-10-CM | POA: Diagnosis not present

## 2022-06-23 DIAGNOSIS — E039 Hypothyroidism, unspecified: Secondary | ICD-10-CM | POA: Diagnosis not present

## 2022-06-23 DIAGNOSIS — R634 Abnormal weight loss: Secondary | ICD-10-CM | POA: Diagnosis not present

## 2022-07-05 ENCOUNTER — Encounter (HOSPITAL_COMMUNITY): Payer: Self-pay | Admitting: Internal Medicine

## 2022-07-05 ENCOUNTER — Emergency Department (HOSPITAL_COMMUNITY): Payer: Medicare Other

## 2022-07-05 ENCOUNTER — Inpatient Hospital Stay (HOSPITAL_COMMUNITY)
Admission: EM | Admit: 2022-07-05 | Discharge: 2022-07-08 | DRG: 480 | Disposition: A | Discharge status: Home Health | Payer: Medicare Other | Attending: Family Medicine | Admitting: Family Medicine

## 2022-07-05 ENCOUNTER — Other Ambulatory Visit: Payer: Self-pay

## 2022-07-05 DIAGNOSIS — Z885 Allergy status to narcotic agent status: Secondary | ICD-10-CM

## 2022-07-05 DIAGNOSIS — I9589 Other hypotension: Secondary | ICD-10-CM | POA: Diagnosis not present

## 2022-07-05 DIAGNOSIS — Z825 Family history of asthma and other chronic lower respiratory diseases: Secondary | ICD-10-CM

## 2022-07-05 DIAGNOSIS — I499 Cardiac arrhythmia, unspecified: Secondary | ICD-10-CM | POA: Diagnosis not present

## 2022-07-05 DIAGNOSIS — Z743 Need for continuous supervision: Secondary | ICD-10-CM | POA: Diagnosis not present

## 2022-07-05 DIAGNOSIS — Z888 Allergy status to other drugs, medicaments and biological substances status: Secondary | ICD-10-CM | POA: Diagnosis not present

## 2022-07-05 DIAGNOSIS — I10 Essential (primary) hypertension: Secondary | ICD-10-CM | POA: Diagnosis not present

## 2022-07-05 DIAGNOSIS — G8929 Other chronic pain: Secondary | ICD-10-CM

## 2022-07-05 DIAGNOSIS — Z79899 Other long term (current) drug therapy: Secondary | ICD-10-CM | POA: Diagnosis not present

## 2022-07-05 DIAGNOSIS — J8410 Pulmonary fibrosis, unspecified: Secondary | ICD-10-CM | POA: Diagnosis not present

## 2022-07-05 DIAGNOSIS — J449 Chronic obstructive pulmonary disease, unspecified: Secondary | ICD-10-CM | POA: Diagnosis not present

## 2022-07-05 DIAGNOSIS — Z681 Body mass index (BMI) 19 or less, adult: Secondary | ICD-10-CM

## 2022-07-05 DIAGNOSIS — F419 Anxiety disorder, unspecified: Secondary | ICD-10-CM | POA: Diagnosis present

## 2022-07-05 DIAGNOSIS — Z882 Allergy status to sulfonamides status: Secondary | ICD-10-CM | POA: Diagnosis not present

## 2022-07-05 DIAGNOSIS — G894 Chronic pain syndrome: Secondary | ICD-10-CM | POA: Diagnosis not present

## 2022-07-05 DIAGNOSIS — Z79890 Hormone replacement therapy (postmenopausal): Secondary | ICD-10-CM | POA: Diagnosis not present

## 2022-07-05 DIAGNOSIS — D649 Anemia, unspecified: Secondary | ICD-10-CM | POA: Diagnosis present

## 2022-07-05 DIAGNOSIS — K219 Gastro-esophageal reflux disease without esophagitis: Secondary | ICD-10-CM | POA: Diagnosis present

## 2022-07-05 DIAGNOSIS — Z9049 Acquired absence of other specified parts of digestive tract: Secondary | ICD-10-CM | POA: Diagnosis not present

## 2022-07-05 DIAGNOSIS — F339 Major depressive disorder, recurrent, unspecified: Secondary | ICD-10-CM

## 2022-07-05 DIAGNOSIS — I959 Hypotension, unspecified: Secondary | ICD-10-CM | POA: Diagnosis not present

## 2022-07-05 DIAGNOSIS — W108XXA Fall (on) (from) other stairs and steps, initial encounter: Secondary | ICD-10-CM | POA: Diagnosis present

## 2022-07-05 DIAGNOSIS — E559 Vitamin D deficiency, unspecified: Secondary | ICD-10-CM | POA: Diagnosis not present

## 2022-07-05 DIAGNOSIS — Z8249 Family history of ischemic heart disease and other diseases of the circulatory system: Secondary | ICD-10-CM

## 2022-07-05 DIAGNOSIS — Z903 Acquired absence of stomach [part of]: Secondary | ICD-10-CM

## 2022-07-05 DIAGNOSIS — F17210 Nicotine dependence, cigarettes, uncomplicated: Secondary | ICD-10-CM | POA: Diagnosis present

## 2022-07-05 DIAGNOSIS — W19XXXA Unspecified fall, initial encounter: Secondary | ICD-10-CM | POA: Diagnosis not present

## 2022-07-05 DIAGNOSIS — S72142D Displaced intertrochanteric fracture of left femur, subsequent encounter for closed fracture with routine healing: Secondary | ICD-10-CM | POA: Diagnosis not present

## 2022-07-05 DIAGNOSIS — D509 Iron deficiency anemia, unspecified: Secondary | ICD-10-CM | POA: Diagnosis not present

## 2022-07-05 DIAGNOSIS — F32A Depression, unspecified: Secondary | ICD-10-CM | POA: Diagnosis not present

## 2022-07-05 DIAGNOSIS — S72102A Unspecified trochanteric fracture of left femur, initial encounter for closed fracture: Secondary | ICD-10-CM | POA: Diagnosis not present

## 2022-07-05 DIAGNOSIS — E039 Hypothyroidism, unspecified: Secondary | ICD-10-CM

## 2022-07-05 DIAGNOSIS — D500 Iron deficiency anemia secondary to blood loss (chronic): Secondary | ICD-10-CM | POA: Diagnosis not present

## 2022-07-05 DIAGNOSIS — M25552 Pain in left hip: Secondary | ICD-10-CM | POA: Diagnosis not present

## 2022-07-05 DIAGNOSIS — Z803 Family history of malignant neoplasm of breast: Secondary | ICD-10-CM

## 2022-07-05 DIAGNOSIS — F429 Obsessive-compulsive disorder, unspecified: Secondary | ICD-10-CM | POA: Diagnosis present

## 2022-07-05 DIAGNOSIS — Z4789 Encounter for other orthopedic aftercare: Secondary | ICD-10-CM | POA: Diagnosis not present

## 2022-07-05 DIAGNOSIS — R9431 Abnormal electrocardiogram [ECG] [EKG]: Secondary | ICD-10-CM | POA: Diagnosis not present

## 2022-07-05 DIAGNOSIS — D62 Acute posthemorrhagic anemia: Secondary | ICD-10-CM | POA: Diagnosis not present

## 2022-07-05 DIAGNOSIS — M797 Fibromyalgia: Secondary | ICD-10-CM | POA: Diagnosis present

## 2022-07-05 DIAGNOSIS — Z90710 Acquired absence of both cervix and uterus: Secondary | ICD-10-CM

## 2022-07-05 DIAGNOSIS — K2100 Gastro-esophageal reflux disease with esophagitis, without bleeding: Secondary | ICD-10-CM

## 2022-07-05 DIAGNOSIS — S7292XA Unspecified fracture of left femur, initial encounter for closed fracture: Secondary | ICD-10-CM | POA: Diagnosis not present

## 2022-07-05 DIAGNOSIS — E43 Unspecified severe protein-calorie malnutrition: Secondary | ICD-10-CM | POA: Diagnosis not present

## 2022-07-05 DIAGNOSIS — Z515 Encounter for palliative care: Secondary | ICD-10-CM | POA: Diagnosis not present

## 2022-07-05 DIAGNOSIS — S72142A Displaced intertrochanteric fracture of left femur, initial encounter for closed fracture: Secondary | ICD-10-CM | POA: Diagnosis not present

## 2022-07-05 DIAGNOSIS — Z7189 Other specified counseling: Secondary | ICD-10-CM | POA: Diagnosis not present

## 2022-07-05 DIAGNOSIS — Z832 Family history of diseases of the blood and blood-forming organs and certain disorders involving the immune mechanism: Secondary | ICD-10-CM

## 2022-07-05 DIAGNOSIS — S72002A Fracture of unspecified part of neck of left femur, initial encounter for closed fracture: Secondary | ICD-10-CM

## 2022-07-05 DIAGNOSIS — K5989 Other specified functional intestinal disorders: Secondary | ICD-10-CM | POA: Diagnosis not present

## 2022-07-05 DIAGNOSIS — Y9222 Religious institution as the place of occurrence of the external cause: Secondary | ICD-10-CM

## 2022-07-05 HISTORY — DX: Altered mental status, unspecified: R41.82

## 2022-07-05 HISTORY — DX: Pneumonia, unspecified organism: J18.9

## 2022-07-05 LAB — CBC WITH DIFFERENTIAL/PLATELET
Abs Immature Granulocytes: 0.04 10*3/uL (ref 0.00–0.07)
Basophils Absolute: 0 10*3/uL (ref 0.0–0.1)
Basophils Relative: 0 %
Eosinophils Absolute: 0.1 10*3/uL (ref 0.0–0.5)
Eosinophils Relative: 1 %
HCT: 32.1 % — ABNORMAL LOW (ref 36.0–46.0)
Hemoglobin: 9.2 g/dL — ABNORMAL LOW (ref 12.0–15.0)
Immature Granulocytes: 0 %
Lymphocytes Relative: 24 %
Lymphs Abs: 2.5 10*3/uL (ref 0.7–4.0)
MCH: 21.9 pg — ABNORMAL LOW (ref 26.0–34.0)
MCHC: 28.7 g/dL — ABNORMAL LOW (ref 30.0–36.0)
MCV: 76.2 fL — ABNORMAL LOW (ref 80.0–100.0)
Monocytes Absolute: 0.6 10*3/uL (ref 0.1–1.0)
Monocytes Relative: 6 %
Neutro Abs: 7.3 10*3/uL (ref 1.7–7.7)
Neutrophils Relative %: 69 %
Platelets: 276 10*3/uL (ref 150–400)
RBC: 4.21 MIL/uL (ref 3.87–5.11)
RDW: 16.7 % — ABNORMAL HIGH (ref 11.5–15.5)
WBC: 10.6 10*3/uL — ABNORMAL HIGH (ref 4.0–10.5)
nRBC: 0 % (ref 0.0–0.2)

## 2022-07-05 LAB — COMPREHENSIVE METABOLIC PANEL
ALT: 11 U/L (ref 0–44)
AST: 10 U/L — ABNORMAL LOW (ref 15–41)
Albumin: 2.4 g/dL — ABNORMAL LOW (ref 3.5–5.0)
Alkaline Phosphatase: 93 U/L (ref 38–126)
Anion gap: 12 (ref 5–15)
BUN: 12 mg/dL (ref 6–20)
CO2: 22 mmol/L (ref 22–32)
Calcium: 8.4 mg/dL — ABNORMAL LOW (ref 8.9–10.3)
Chloride: 103 mmol/L (ref 98–111)
Creatinine, Ser: 0.52 mg/dL (ref 0.44–1.00)
GFR, Estimated: >60 mL/min (ref >60)
Glucose, Bld: 70 mg/dL (ref 70–99)
Potassium: 4.3 mmol/L (ref 3.5–5.1)
Sodium: 137 mmol/L (ref 135–145)
Total Bilirubin: 0.3 mg/dL (ref 0.3–1.2)
Total Protein: 5.7 g/dL — ABNORMAL LOW (ref 6.5–8.1)

## 2022-07-05 LAB — HIV ANTIBODY (ROUTINE TESTING W REFLEX): HIV Screen 4th Generation wRfx: NONREACTIVE

## 2022-07-05 MED ORDER — QUETIAPINE FUMARATE 100 MG PO TABS
200.0000 mg | ORAL_TABLET | Freq: Every day | ORAL | Status: DC
  Administered 2022-07-05 – 2022-07-07 (×3): 200 mg via ORAL
  Filled 2022-07-05 (×3): qty 2

## 2022-07-05 MED ORDER — OXYCODONE HCL 5 MG PO TABS
5.0000 mg | ORAL_TABLET | Freq: Four times a day (QID) | ORAL | Status: DC | PRN
  Administered 2022-07-06 – 2022-07-08 (×8): 5 mg via ORAL
  Filled 2022-07-05 (×8): qty 1

## 2022-07-05 MED ORDER — NALOXONE HCL 4 MG/0.1ML NA LIQD: 1.0000 | Freq: Once | NASAL | Status: DC | PRN

## 2022-07-05 MED ORDER — FENTANYL CITRATE PF 50 MCG/ML IJ SOSY
50.0000 ug | PREFILLED_SYRINGE | Freq: Once | INTRAMUSCULAR | Status: AC
  Administered 2022-07-05: 50 ug via INTRAVENOUS
  Filled 2022-07-05: qty 1

## 2022-07-05 MED ORDER — FENTANYL CITRATE PF 50 MCG/ML IJ SOSY: 25.0000 ug | PREFILLED_SYRINGE | INTRAMUSCULAR | Status: DC | PRN

## 2022-07-05 MED ORDER — POLYETHYLENE GLYCOL 3350 17 G PO PACK: 17.0000 g | PACK | Freq: Every day | ORAL | Status: DC | PRN

## 2022-07-05 MED ORDER — ONDANSETRON HCL 4 MG/2ML IJ SOLN
4.0000 mg | Freq: Four times a day (QID) | INTRAMUSCULAR | Status: DC | PRN
  Administered 2022-07-05: 4 mg via INTRAVENOUS
  Filled 2022-07-05: qty 2

## 2022-07-05 MED ORDER — HYDROMORPHONE HCL 1 MG/ML IJ SOLN
2.0000 mg | Freq: Once | INTRAMUSCULAR | Status: AC | PRN
  Administered 2022-07-05: 2 mg via INTRAVENOUS
  Filled 2022-07-05: qty 2

## 2022-07-05 MED ORDER — ALBUTEROL SULFATE (2.5 MG/3ML) 0.083% IN NEBU: 3.0000 mL | INHALATION_SOLUTION | Freq: Four times a day (QID) | RESPIRATORY_TRACT | Status: DC | PRN

## 2022-07-05 MED ORDER — ONDANSETRON HCL 4 MG/2ML IJ SOLN
4.0000 mg | Freq: Once | INTRAMUSCULAR | Status: AC
  Administered 2022-07-05: 4 mg via INTRAVENOUS
  Filled 2022-07-05: qty 2

## 2022-07-05 MED ORDER — NICOTINE 21 MG/24HR TD PT24
21.0000 mg | MEDICATED_PATCH | Freq: Every day | TRANSDERMAL | Status: DC
  Administered 2022-07-05 – 2022-07-08 (×3): 21 mg via TRANSDERMAL
  Filled 2022-07-05 (×4): qty 1

## 2022-07-05 MED ORDER — BUSPIRONE HCL 5 MG PO TABS
15.0000 mg | ORAL_TABLET | Freq: Two times a day (BID) | ORAL | Status: DC
  Administered 2022-07-05 – 2022-07-08 (×5): 15 mg via ORAL
  Filled 2022-07-05 (×5): qty 1

## 2022-07-05 MED ORDER — FENTANYL 25 MCG/HR TD PT72
1.0000 | MEDICATED_PATCH | TRANSDERMAL | Status: DC
  Administered 2022-07-06: 1 via TRANSDERMAL
  Filled 2022-07-05: qty 1

## 2022-07-05 MED ORDER — PANTOPRAZOLE SODIUM 40 MG PO TBEC
40.0000 mg | DELAYED_RELEASE_TABLET | Freq: Every day | ORAL | Status: DC
  Administered 2022-07-07 – 2022-07-08 (×2): 40 mg via ORAL
  Filled 2022-07-05 (×3): qty 1

## 2022-07-05 MED ORDER — POLYVINYL ALCOHOL 1.4 % OP SOLN
1.0000 [drp] | OPHTHALMIC | Status: DC | PRN
  Filled 2022-07-05: qty 15

## 2022-07-05 MED ORDER — PAROXETINE HCL 20 MG PO TABS
20.0000 mg | ORAL_TABLET | Freq: Every day | ORAL | Status: DC
  Administered 2022-07-05 – 2022-07-07 (×3): 20 mg via ORAL
  Filled 2022-07-05 (×4): qty 1

## 2022-07-05 MED ORDER — GABAPENTIN 400 MG PO CAPS
800.0000 mg | ORAL_CAPSULE | Freq: Three times a day (TID) | ORAL | Status: DC
  Administered 2022-07-05 – 2022-07-08 (×8): 800 mg via ORAL
  Filled 2022-07-05 (×9): qty 2

## 2022-07-05 MED ORDER — FENTANYL CITRATE PF 50 MCG/ML IJ SOSY
25.0000 ug | PREFILLED_SYRINGE | Freq: Once | INTRAMUSCULAR | Status: AC
  Administered 2022-07-05: 25 ug via INTRAVENOUS
  Filled 2022-07-05: qty 1

## 2022-07-05 MED ORDER — KETOROLAC TROMETHAMINE 30 MG/ML IJ SOLN
30.0000 mg | Freq: Four times a day (QID) | INTRAMUSCULAR | Status: DC | PRN
  Administered 2022-07-05 – 2022-07-06 (×3): 30 mg via INTRAVENOUS
  Filled 2022-07-05 (×3): qty 1

## 2022-07-05 MED ORDER — HYDROMORPHONE HCL 1 MG/ML IJ SOLN
1.0000 mg | INTRAMUSCULAR | Status: DC | PRN
  Administered 2022-07-05 – 2022-07-08 (×6): 1 mg via INTRAVENOUS
  Filled 2022-07-05 (×6): qty 1

## 2022-07-05 MED ORDER — TRAZODONE HCL 50 MG PO TABS
50.0000 mg | ORAL_TABLET | Freq: Every evening | ORAL | Status: DC | PRN
  Administered 2022-07-05 – 2022-07-07 (×2): 50 mg via ORAL
  Filled 2022-07-05 (×2): qty 1

## 2022-07-05 MED ORDER — CYCLOBENZAPRINE HCL 5 MG PO TABS
5.0000 mg | ORAL_TABLET | Freq: Three times a day (TID) | ORAL | Status: DC | PRN
  Administered 2022-07-05 – 2022-07-08 (×5): 5 mg via ORAL
  Filled 2022-07-05 (×5): qty 1

## 2022-07-05 NOTE — ED Triage Notes (Signed)
Patient brought in by EMS today. Pt is coming from church and fell down steps at church. Pt is having pain in left hip. Severe pain. Pt received of Fentanyl. Fell on hard floor. Pt lost balance and fell. Unable to straighten the leg. Sounds like hip injury.   107/76 84 92-96% CBG 174 (ate cake)

## 2022-07-05 NOTE — ED Notes (Signed)
ED TO INPATIENT HANDOFF REPORT  ED Nurse Name and Phone #: [phone] Yamna Mackel S   S Name/Age/Gender [name] 52 y.o. female Room/Bed: 008C/008C  Code Status   Code Status: Full Code  Home/SNF/Other Home Patient oriented to: self, place, time, and situation Is this baseline? Yes   Triage Complete: Triage complete  Chief Complaint Closed displaced intertrochanteric fracture of left femur [facility]) [S72.142A]  Triage Note Patient brought in by EMS today. Pt is coming from church and fell down steps at church. Pt is having pain in left hip. Severe pain. Pt received of Fentanyl. Fell on hard floor. Pt lost balance and fell. Unable to straighten the leg. Sounds like hip injury.   107/76 84 92-96% CBG 174 (ate cake)    Allergies Allergies  Allergen Reactions   Sulfamethoxazole Anaphylaxis   Morphine And Codeine Other (See Comments)    Delirium. Makes me go "crazy"   Xanax Xr [Alprazolam Er] Other (See Comments)    Enhanced depression; feels gloomy   Dilaudid [Hydromorphone] Nausea And Vomiting    "Severe vomiting "    Morphine And Codeine    Sulfa Antibiotics     Level of Care/Admitting Diagnosis ED Disposition     ED Disposition  Admit   Condition  --   Comment  Hospital Area: [facility] [[id number]]  Level of Care: Telemetry Surgical [[id number]]  May place patient in observation at [facility] or [facility] if equivalent level of care is available:: No  Covid Evaluation: Asymptomatic - no recent exposure (last 10 days) testing not required  Diagnosis: Closed displaced intertrochanteric fracture of left femur [facility]) [[id number]]  Admitting Physician: Synetta Fail [[id number]]  Attending Physician: Synetta Fail [[id number]]          B Medical/Surgery History Past Medical History:  Diagnosis Date   Acute encephalopathy 01/14/2021   Acute respiratory failure with hypoxia (HCC) 01/03/2021   Allergic rhinitis    Altered mental status     Anemia    Anxiety    Anxiety disorder    Aspiration into airway    per pt hx on 08/07/19 aspirates food into lungs when asleep    Calcaneus fracture, right 06/19/2017   Chronic interstitial cystitis    Chronic pain 06/19/2017   Closed fracture of right distal tibia 06/17/2017   Community acquired pneumonia    COPD (chronic obstructive pulmonary disease) (HCC)    Depression    Fibromyalgia    Gastric outlet obstruction    Gastric ulcer    GERD (gastroesophageal reflux disease)    Hiatal hernia    History of kidney stones    Hypothyroidism    in the past   IBS (irritable bowel syndrome)    IDA (iron deficiency anemia)    Internal hemorrhoids    Kidney stones    Myalgia    OCD (obsessive compulsive disorder)    Pneumonia    Ulcerative esophagitis    Vitamin D deficiency    Past Surgical History:  Procedure Laterality Date   20 HOUR PH STUDY N/A 03/15/2019   Procedure: 24 HOUR PH STUDY;  Surgeon: Beverley Fiedler, MD;  [facility];  Service: Gastroenterology;  Laterality: N/A;   ABDOMINAL HYSTERECTOMY     BILROTH I PROCEDURE  06/22/2016   Dr Dimas Chyle---- due to gastric ulcer   ESOPHAGEAL MANOMETRY N/A 03/15/2019   Procedure: ESOPHAGEAL MANOMETRY (EM);  Surgeon: Beverley Fiedler, MD;  [facility];  Service: Gastroenterology;  Laterality: N/A;   EXTERNAL EAR SURGERY     dont know specific ear sugery (inner/external)   FRACTURE SURGERY     right arm   gastrectomy and bilateral vagotomy      IR CM INJ ANY COLONIC TUBE W/FLUORO  12/30/2020   IR PATIENT EVAL TECH 0-60 MINS  08/29/2021   IR REPLC DUODEN/JEJUNO TUBE PERCUT W/FLUORO  09/15/2019   IR REPLC DUODEN/JEJUNO TUBE PERCUT W/FLUORO  07/23/2020   IR REPLC DUODEN/JEJUNO TUBE PERCUT W/FLUORO  09/23/2020   IR REPLC DUODEN/JEJUNO TUBE PERCUT W/FLUORO  12/27/2020   IR SINUS/FIST TUBE CHK-NON GI  03/14/2020   IR SINUS/FIST TUBE CHK-NON GI  06/05/2021   LAPAROSCOPIC JEJUNOSTOMY N/A 08/09/2019   Procedure: LAPAROSCOPIC  FEEDING JEJUNOSTOMY;  Surgeon: Luretha Murphy, MD;  [facility];  Service: General;  Laterality: N/A;   LAPAROSCOPIC JEJUNOSTOMY N/A 06/24/2021   Procedure: LAPAROSCOPIC TAKEDOWN OF FEEDING JEJUNOSTOMY;  Surgeon: Luretha Murphy, MD;  Location[facility];  Service: General;  Laterality: N/A;   mutliple surgeries due to intertitial cystitis     bladder stimulator   ORIF TIBIA FRACTURE Right 06/17/2017   Procedure: OPEN REDUCTION INTERNAL FIXATION (ORIF) DISTAL TIBIA FRACTURE AND CALCANEOUS;  Surgeon: Myrene Galas, MD;  Location: [facility];  Service: Orthopedics;  Laterality: Right;   OTHER SURGICAL HISTORY     TENS implantation for Chronic Interstitial Cystitis   PARTIAL GASTRECTOMY       A IV Location/Drains/Wounds Patient Lines/Drains/Airways Status     Active Line/Drains/Airways     Name Placement date Placement time Site Days   Peripheral IV 06/24/21 18 G Posterior;Right Hand 06/24/21  1014  Hand  376   Peripheral IV 07/05/22 20 G Anterior;Left;Proximal Forearm 07/05/22  1223  Forearm  less than 1   Gastrostomy/Enterostomy Percutaneous endoscopic gastrostomy (PEG) Other (comment) --  --  Other (comment)  --   Gastrostomy/Enterostomy Jejunostomy 20 Fr. LLQ 01/09/21  1414  LLQ  542   Incision (Closed) 08/09/19 Abdomen Other (Comment) 08/09/19  1356  -- 1061   Incision (Closed) 06/24/21 Abdomen 06/24/21  1214  -- 376   Incision - 2 Ports Abdomen Right;Upper Right;Lower 08/09/19  1351  -- 1061   Incision - 2 Ports Abdomen Right;Mid Right;Lower 06/24/21  1054  -- 376            Intake/Output Last 24 hours No intake or output data in the 24 hours ending 07/05/22 1709  Labs/Imaging Results for orders placed or performed during the hospital encounter of 07/05/22 (from the past 48 hour(s))  CBC with Differential/Platelet     Status: Abnormal   Collection Time: 07/05/22 12:18 PM  Result Value Ref Range   WBC 10.6 (H) 4.0 - 10.5 K/uL   RBC 4.21 3.87 - 5.11 MIL/uL   Hemoglobin 9.2  (L) 12.0 - 15.0 g/dL   HCT 25.9 (L) 56.3 - 87.5 %   MCV 76.2 (L) 80.0 - 100.0 fL   MCH 21.9 (L) 26.0 - 34.0 pg   MCHC 28.7 (L) 30.0 - 36.0 g/dL   RDW 64.3 (H) 32.9 - 51.8 %   Platelets 276 150 - 400 K/uL   nRBC 0.0 0.0 - 0.2 %   Neutrophils Relative % 69 %   Neutro Abs 7.3 1.7 - 7.7 K/uL   Lymphocytes Relative 24 %   Lymphs Abs 2.5 0.7 - 4.0 K/uL   Monocytes Relative 6 %   Monocytes Absolute 0.6 0.1 - 1.0 K/uL   Eosinophils Relative 1 %  Eosinophils Absolute 0.1 0.0 - 0.5 K/uL   Basophils Relative 0 %   Basophils Absolute 0.0 0.0 - 0.1 K/uL   Immature Granulocytes 0 %   Abs Immature Granulocytes 0.04 0.00 - 0.07 K/uL    Comment: Performed at [facility], [address] [place], [place] [address]  Comprehensive metabolic panel     Status: Abnormal   Collection Time: 07/05/22 12:18 PM  Result Value Ref Range   Sodium 137 135 - 145 mmol/L   Potassium 4.3 3.5 - 5.1 mmol/L   Chloride 103 98 - 111 mmol/L   CO2 22 22 - 32 mmol/L   Glucose, Bld 70 70 - 99 mg/dL    Comment: Glucose reference range applies only to samples taken after fasting for at least 8 hours.   BUN 12 6 - 20 mg/dL   Creatinine, Ser 9.14 0.44 - 1.00 mg/dL   Calcium 8.4 (L) 8.9 - 10.3 mg/dL   Total Protein 5.7 (L) 6.5 - 8.1 g/dL   Albumin 2.4 (L) 3.5 - 5.0 g/dL   AST 10 (L) 15 - 41 U/L   ALT 11 0 - 44 U/L   Alkaline Phosphatase 93 38 - 126 U/L   Total Bilirubin 0.3 0.3 - 1.2 mg/dL   GFR, Estimated >78 >29 mL/min    Comment: (NOTE) Calculated using the CKD-EPI Creatinine Equation (2021)    Anion gap 12 5 - 15    Comment: Performed at [facility], [address] [place], [place] [address]   DG Hip Unilat With Pelvis 2-3 Views Left  Result Date: 07/05/2022 CLINICAL DATA:  Left hip pain after fall. EXAM: DG HIP (WITH OR WITHOUT PELVIS) 2-3V LEFT COMPARISON:  None Available. FINDINGS: Mildly displaced intertrochanteric fracture is seen involving proximal left femur. No dislocation is noted.  IMPRESSION: Mildly displaced intertrochanteric fracture of proximal left femur. Electronically Signed   By: Lupita Raider M.D.   On: 07/05/2022 14:40    Pending Labs Unresulted Labs (From admission, onward)     Start     Ordered   07/05/22 1536  Type and screen [facility]  Once,   R       Comments: [facility]    07/05/22 1541   07/05/22 1533  HIV Antibody (routine testing w rflx)  (HIV Antibody (Routine testing w reflex) panel)  Once,   R        07/05/22 1541            Vitals/Pain Today's Vitals   07/05/22 1545 07/05/22 1600 07/05/22 1615 07/05/22 1625  BP: 128/86 (!) 124/90 129/82   Resp: 12 10 15    Temp:    98.6 F (37 C)  TempSrc:    Oral  SpO2:      Weight:      Height:      PainSc:        Isolation Precautions No active isolations  Medications Medications  nicotine (NICODERM CQ - dosed in mg/24 hours) patch 21 mg (21 mg Transdermal Patch Applied 07/05/22 1507)  fentaNYL (SUBLIMAZE) injection 25 mcg (has no administration in time range)  polyethylene glycol (MIRALAX / GLYCOLAX) packet 17 g (has no administration in time range)  HYDROmorphone (DILAUDID) injection 1 mg (1 mg Intravenous Given 07/05/22 1659)  ondansetron (ZOFRAN) injection 4 mg (has no administration in time range)  busPIRone (BUSPAR) tablet 15 mg (has no administration in time range)  cyclobenzaprine (FLEXERIL) tablet 5 mg (has no  administration in time range)  albuterol (PROVENTIL) (2.5 MG/3ML) 0.083% nebulizer solution 3 mL (has no administration in time range)  fentaNYL (DURAGESIC) 12 MCG/HR 1 patch (has no administration in time range)  gabapentin (NEURONTIN) capsule 800 mg (has no administration in time range)  naloxone (NARCAN) nasal spray 4 mg/0.1 mL (has no administration in time range)  oxyCODONE (Oxy IR/ROXICODONE) immediate release tablet 5 mg (has no administration in time range)  pantoprazole (PROTONIX) EC tablet 40 mg (has no administration in time  range)  PARoxetine (PAXIL) tablet 20 mg (has no administration in time range)  polyvinyl alcohol (LIQUIFILM TEARS) 1.4 % ophthalmic solution 1 drop (has no administration in time range)  QUEtiapine (SEROQUEL) tablet 200 mg (has no administration in time range)  traZODone (DESYREL) tablet 50 mg (has no administration in time range)  ondansetron (ZOFRAN) injection 4 mg (4 mg Intravenous Given 07/05/22 1308)  fentaNYL (SUBLIMAZE) injection 25 mcg (25 mcg Intravenous Given 07/05/22 1308)  fentaNYL (SUBLIMAZE) injection 50 mcg (50 mcg Intravenous Given 07/05/22 1502)    Mobility non-ambulatory     Focused Assessments    R Recommendations: See Admitting Provider Note  Report given to:   Additional Notes: patient is having severe pain to left hip. Can not walk at the moment due to pain. Pt is alert and aware.

## 2022-07-05 NOTE — ED Provider Notes (Addendum)
[place] EMERGENCY DEPARTMENT AT [facility] Provider Note   CSN: [id number] Arrival date & time: 07/05/22  1207     History  Chief Complaint  Patient presents with   Fall   Hip Pain    [name] is a 52 y.o. female.  Patient brought in by EMS patient [place] did not fall outside.  Complaining of left hip pain.  Patient has a history of chronic pain normally is on fentanyl patches.  They were removed by EMS.  EMS reports that they gave her 100 mcg of fentanyl.  Patient did not hit her head patient does not complain of any pain to her upper extremities no pain to the right lower extremity.  Patient does have chronic back pain due to his collapse of several vertebrae but that is not new or worse.  Patient apparently lost balance and fell she did not pass out.  Patient unable to straighten the leg.  But clinically here knees and ankles are both the same length with both legs bent at the knees.  Patient last seen January 2023 for altered mental status.  Patient is still an everyday smoker.  Past medical history is extensive patient had half of her stomach removed due to a gastric ulcer.  Since that time patient's had a lot of nutritional challenges.  She has COPD history of kidney stones chronic pain diagnosed in 2019 irritable bowel syndrome vitamin D deficiency depression myalgia hypothyroidism gastric outlet obstruction fibromyalgia.  Patient had open reduction internal fixation of her tibia in 2019 says she is got hardware in there had abdominal hysterectomy.  Multiple surgeries due to interstitial cystitis.  Patient had gastrectomy and bilateral vagotomy and laparoscopic jejunostomy in 2021 by Wenda Low done over at [facility].  It was a feeding laparoscopic jejunostomy placed.  Patient had the abdominal surgery that was a Billroth I procedure in 2018.       Home Medications Prior to Admission medications   Medication Sig Start Date End Date  Taking? Authorizing Provider  acetaminophen (TYLENOL) 325 MG tablet Take 650 mg by mouth every 6 (six) hours as needed for moderate pain.    [provider]  albuterol (VENTOLIN HFA) 108 (90 Base) MCG/ACT inhaler Inhale 1-2 puffs into the lungs 4 (four) times daily as needed for shortness of breath or wheezing. 12/26/20   [provider]  amLODipine (NORVASC) 5 MG tablet Take 1 tablet (5 mg total) by mouth daily. 01/15/21 05/15/21  Meredeth Ide, MD  Aspirin-Salicylamide-Caffeine (BC HEADACHE PO) Take 1 packet by mouth daily as needed (pain/headache).     [provider]  busPIRone (BUSPAR) 15 MG tablet Take 15 mg by mouth 2 (two) times daily. 12/28/20   [provider]  cyclobenzaprine (FLEXERIL) 5 MG tablet Take 5 mg by mouth 3 (three) times daily. 12/19/20   [provider]  dronabinol (MARINOL) 2.5 MG capsule TAKE TWO CAPSULES BY MOUTH EVERY MORNING AND 1 CAPSULE EVERY EVENING Patient not taking: Reported on 06/16/2021 02/25/21   Pyrtle, Carie Caddy, MD  fentaNYL (DURAGESIC) 12 MCG/HR Place 1 patch onto the skin every 3 (three) days. 06/05/21   [provider]  gabapentin (NEURONTIN) 800 MG tablet Take 800 mg by mouth 3 (three) times daily. 12/16/20   [provider]  hydrOXYzine (ATARAX/VISTARIL) 25 MG tablet Take 25-50 mg by mouth 3 (three) times daily. 02/08/17   [provider]  levothyroxine (SYNTHROID) 25 MCG tablet Take  1 tablet (25 mcg total) by mouth daily before breakfast. Patient not taking: Reported on 06/16/2021 01/15/21   Meredeth Ide, MD  naloxone [facility]) nasal spray 4 mg/0.1 mL Place 1 spray into the nose once as needed (opioid reversal). 10/08/20   [provider]  nicotine polacrilex (NICORETTE) 2 MG gum Take 1 each (2 mg total) by mouth as needed for smoking cessation. Patient not taking: Reported on 06/16/2021 02/27/21   Omar Person, MD  ondansetron (ZOFRAN-ODT) 4 MG disintegrating tablet TAKE 1 TABLET (4 MG  TOTAL) BY MOUTH EVERY 6 (SIX) HOURS AS NEEDED FOR NAUSEA OR VOMITING 01/08/21   Pyrtle, Carie Caddy, MD  oxyCODONE-acetaminophen (PERCOCET/ROXICET) 5-325 MG tablet Take 1 tablet by mouth in the morning, at noon, and at bedtime. 12/18/20   [provider]  pantoprazole (PROTONIX) 40 MG tablet Take 1 tablet (40 mg total) by mouth 2 (two) times daily. Patient taking differently: Take 40 mg by mouth daily. 01/05/21 01/05/22  Ghimire, Werner Lean, MD  PARoxetine (PAXIL) 20 MG tablet Take 20 mg by mouth at bedtime.    [provider]  polyvinyl alcohol (LIQUIFILM TEARS) 1.4 % ophthalmic solution Place 1 drop into both eyes as needed for dry eyes.    [provider]  promethazine (PHENERGAN) 25 MG tablet Take 1 tablet (25 mg total) by mouth 3 (three) times daily as needed. Patient not taking: Reported on 06/16/2021 08/27/20   Beverley Fiedler, MD  QUEtiapine (SEROQUEL) 200 MG tablet Take 200 mg by mouth at bedtime. 12/14/20   [provider]  sucralfate (CARAFATE) 1 GM/10ML suspension Take 10 mLs (1 g total) by mouth 4 (four) times daily -  with meals and at bedtime. Patient not taking: Reported on 06/16/2021 01/05/21   Maretta Bees, MD  traZODone (DESYREL) 50 MG tablet Take 50-100 mg by mouth at bedtime. 10/11/20   [provider]      Allergies    Sulfamethoxazole, Morphine and codeine, Xanax xr [alprazolam er], Dilaudid [hydromorphone], Morphine and codeine, and Sulfa antibiotics    Review of Systems   Review of Systems  Constitutional:  Negative for chills and fever.  HENT:  Negative for rhinorrhea and sore throat.   Eyes:  Negative for visual disturbance.  Respiratory:  Negative for cough and shortness of breath.   Cardiovascular:  Negative for chest pain and leg swelling.  Gastrointestinal:  Negative for abdominal pain, diarrhea, nausea and vomiting.  Genitourinary:  Negative for dysuria.  Musculoskeletal:  Positive for back pain. Negative for neck pain.   Skin:  Negative for rash.  Neurological:  Negative for dizziness, light-headedness and headaches.  Hematological:  Does not bruise/bleed easily.  Psychiatric/Behavioral:  Negative for confusion.     Physical Exam Updated Vital Signs BP (!) 120/90   Temp 98.4 F (36.9 C) (Oral)   Resp 15   Ht 1.499 m (4\' 11" )   Wt 28.1 kg   SpO2 100%   BMI 12.52 kg/m  Physical Exam Vitals and nursing note reviewed.  Constitutional:      General: She is not in acute distress.    Appearance: She is well-developed.     Comments: Patient very thin cachectic appearing  HENT:     Head: Normocephalic and atraumatic.  Eyes:     Conjunctiva/sclera: Conjunctivae normal.     Pupils: Pupils are equal, round, and reactive to light.  Cardiovascular:     Rate and Rhythm: Normal rate and regular rhythm.  Heart sounds: No murmur heard. Pulmonary:     Effort: Pulmonary effort is normal. No respiratory distress.     Breath sounds: Normal breath sounds.  Abdominal:     Palpations: Abdomen is soft.     Tenderness: There is no abdominal tenderness.  Musculoskeletal:        General: Tenderness and signs of injury present. No swelling or deformity.     Cervical back: Normal range of motion and neck supple. No tenderness.     Right lower leg: No edema.     Left lower leg: No edema.     Comments: Tenderness to palpation to the left hip area no evidence of dislocation or shortening of the leg.  Distally dorsalis pedis pulse in both lower extremities is 1+.  Patient very very thin.  No evidence of deformity of the right lower extremity.  Skin:    General: Skin is warm and dry.     Capillary Refill: Capillary refill takes less than 2 seconds.  Neurological:     General: No focal deficit present.     Mental Status: She is alert and oriented to person, place, and time.  Psychiatric:        Mood and Affect: Mood normal.     ED Results / Procedures / Treatments   Labs (all labs ordered are listed, but only  abnormal results are displayed) Labs Reviewed  CBC WITH DIFFERENTIAL/PLATELET - Abnormal; Notable for the following components:      Result Value   WBC 10.6 (*)    Hemoglobin 9.2 (*)    HCT 32.1 (*)    MCV 76.2 (*)    MCH 21.9 (*)    MCHC 28.7 (*)    RDW 16.7 (*)    All other components within normal limits  COMPREHENSIVE METABOLIC PANEL - Abnormal; Notable for the following components:   Calcium 8.4 (*)    Total Protein 5.7 (*)    Albumin 2.4 (*)    AST 10 (*)    All other components within normal limits    EKG EKG Interpretation  Date/Time:  Sunday July 05 2022 12:27:00 EDT Ventricular Rate:  80 PR Interval:  115 QRS Duration: 85 QT Interval:  381 QTC Calculation: 440 R Axis:   84 Text Interpretation: Sinus rhythm Borderline short PR interval Probable anteroseptal infarct, old No significant change since last tracing Confirmed by Vanetta Mulders [phone]) on 07/05/2022 12:39:23 PM  Radiology DG Hip Unilat With Pelvis 2-3 Views Left  Result Date: 07/05/2022 CLINICAL DATA:  Left hip pain after fall. EXAM: DG HIP (WITH OR WITHOUT PELVIS) 2-3V LEFT COMPARISON:  None Available. FINDINGS: Mildly displaced intertrochanteric fracture is seen involving proximal left femur. No dislocation is noted. IMPRESSION: Mildly displaced intertrochanteric fracture of proximal left femur. Electronically Signed   By: Lupita Raider M.D.   On: 07/05/2022 14:40    Procedures Procedures    Medications Ordered in ED Medications  nicotine (NICODERM CQ - dosed in mg/24 hours) patch 21 mg (has no administration in time range)  ondansetron (ZOFRAN) injection 4 mg (4 mg Intravenous Given 07/05/22 1308)  fentaNYL (SUBLIMAZE) injection 25 mcg (25 mcg Intravenous Given 07/05/22 1308)  fentaNYL (SUBLIMAZE) injection 50 mcg (50 mcg Intravenous Given 07/05/22 1502)    ED Course/ Medical Decision Making/ A&P                             Medical Decision Making Amount and/or  Complexity of Data  Reviewed Labs: ordered. Radiology: ordered.  Risk OTC drugs. Prescription drug management. Decision regarding hospitalization.   Patient very thin bones are very tiny.  No clinical evidence of fracture or dislocation of the left hip.  Will get x-ray of that and if that does not show anything because of the concern for possible hairline fracture we will CT that hip.  Patient certainly is set up for broken bones.  Patient states that she is got compression fractures of the spine area and uses fentanyl patches for that.  Patient does not do well with Dilaudid or morphine will give her some IV fentanyl EMS to give her 100 mcg of fentanyl and she is already asking for more.  So she may need more than 25 mcg that I ordered we will titrate to make her comfortable.  Will also get basic labs.  Patient denies trauma to anywhere else other than concern for that left hip.  Labs somewhat reassuring white count 10.6 hemoglobin 9.2 platelets 276.  Complete metabolic panel renal functions normal.  Albumin is low.  X-ray of the left hip shows a mildly displaced intertrochanteric fracture of the proximal left femur.  This would explain why there is no shortening.  Patient does not have a orthopedic doctor locally.  Patient does have a lot of medical problems.  Suspect will require medical admission.  Patient also does not have a primary care doctor locally.  Will contact on-call orthopedics and will contact unassigned medicine.   Final Clinical Impression(s) / ED Diagnoses Final diagnoses:  Fall, initial encounter  Left hip pain  Closed fracture of left hip, initial encounter [facility])    Rx / DC Orders ED Discharge Orders     None         Vanetta Mulders, MD 07/05/22 1302    Vanetta Mulders, MD 07/05/22 1504

## 2022-07-05 NOTE — Progress Notes (Signed)
Pt. Informed of [place] personal valuable policy. Pt. Refused to send purse and wallet home with family or to have locked up with [facility] security.

## 2022-07-05 NOTE — Progress Notes (Signed)
Pt. Arrived to unit alert and oriented c/o pain to left femur. Family at bedside.

## 2022-07-05 NOTE — H&P (Addendum)
History and Physical   [name] LKG:[id number] DOB: 12-13-1970 DOA: 07/05/2022  PCP: Lonie Peak, PA-C   Patient coming from: Home/church  Chief Complaint: Fall, hip pain  HPI: [name] is a 52 y.o. female with medical history significant of hypertension, hypothyroidism, COPD, chronic pain, vertebral compression fractures, anemia, aspiration, pulmonary fibrosis, GERD, depression, anxiety, OCD, gastric ulcer and gastric outlet obstruction status post partial gastric resection and jejunostomy now status post jejunostomy takedown who presents with fall and hip pain.  Patient lost her balance and fell down stairs at church today.  Noted left hip pain following this.  Did not hit her head and denies any other new pain.  She does have stable chronic pain for which she is on a fentanyl patch.  She denies fevers, chills, chest pain, shortness of breath, abdominal pain, constipation, diarrhea, nausea, vomiting.    ED Course: Vital signs in the ED stable.  Lab workup included CMP with calcium 8.4, protein 5.7, albumin 2.4.  CBC with leukocytosis to 10.6 which is mild, hemoglobin 9.2 which is on the lower end of recent baseline of 9-11.  Left hip x-ray showed left this mildly displaced intertrochanteric femur fracture.  Patient received fentanyl, Zofran, nicotine patch in the ED.  Orthopedics consulted and will see the patient.  Review of Systems: As per HPI otherwise all other systems reviewed and are negative.  Past Medical History:  Diagnosis Date   Acute encephalopathy 01/14/2021   Acute respiratory failure with hypoxia (HCC) 01/03/2021   Allergic rhinitis    Altered mental status    Anemia    Anxiety    Anxiety disorder    Aspiration into airway    per pt hx on 08/07/19 aspirates food into lungs when asleep    Calcaneus fracture, right 06/19/2017   Chronic interstitial cystitis    Chronic pain 06/19/2017   Closed fracture of right distal tibia 06/17/2017   Community acquired  pneumonia    COPD (chronic obstructive pulmonary disease) (HCC)    Depression    Fibromyalgia    Gastric outlet obstruction    Gastric ulcer    GERD (gastroesophageal reflux disease)    Hiatal hernia    History of kidney stones    Hypothyroidism    in the past   IBS (irritable bowel syndrome)    IDA (iron deficiency anemia)    Internal hemorrhoids    Kidney stones    Myalgia    OCD (obsessive compulsive disorder)    Pneumonia    Ulcerative esophagitis    Vitamin D deficiency     Past Surgical History:  Procedure Laterality Date   73 HOUR PH STUDY N/A 03/15/2019   Procedure: 24 HOUR PH STUDY;  Surgeon: Beverley Fiedler, MD;  Location: [facility];  Service: Gastroenterology;  Laterality: N/A;   ABDOMINAL HYSTERECTOMY     BILROTH I PROCEDURE  06/22/2016   Dr Dimas Chyle---- due to gastric ulcer   ESOPHAGEAL MANOMETRY N/A 03/15/2019   Procedure: ESOPHAGEAL MANOMETRY (EM);  Surgeon: Beverley Fiedler, MD;  Location: [facility];  Service: Gastroenterology;  Laterality: N/A;   EXTERNAL EAR SURGERY     dont know specific ear sugery (inner/external)   FRACTURE SURGERY     right arm   gastrectomy and bilateral vagotomy      IR CM INJ ANY COLONIC TUBE W/FLUORO  12/30/2020   IR PATIENT EVAL TECH 0-60 MINS  08/29/2021   IR REPLC DUODEN/JEJUNO TUBE PERCUT W/FLUORO  09/15/2019   IR  REPLC DUODEN/JEJUNO TUBE PERCUT W/FLUORO  07/23/2020   IR REPLC DUODEN/JEJUNO TUBE PERCUT W/FLUORO  09/23/2020   IR REPLC DUODEN/JEJUNO TUBE PERCUT W/FLUORO  12/27/2020   IR SINUS/FIST TUBE CHK-NON GI  03/14/2020   IR SINUS/FIST TUBE CHK-NON GI  06/05/2021   LAPAROSCOPIC JEJUNOSTOMY N/A 08/09/2019   Procedure: LAPAROSCOPIC FEEDING JEJUNOSTOMY;  Surgeon: Luretha Murphy, MD;  Location: [facility];  Service: General;  Laterality: N/A;   LAPAROSCOPIC JEJUNOSTOMY N/A 06/24/2021   Procedure: LAPAROSCOPIC TAKEDOWN OF FEEDING JEJUNOSTOMY;  Surgeon: Luretha Murphy, MD;  Location: [facility];  Service: General;  Laterality: N/A;   mutliple  surgeries due to intertitial cystitis     bladder stimulator   ORIF TIBIA FRACTURE Right 06/17/2017   Procedure: OPEN REDUCTION INTERNAL FIXATION (ORIF) DISTAL TIBIA FRACTURE AND CALCANEOUS;  Surgeon: Myrene Galas, MD;  Location: [facility];  Service: Orthopedics;  Laterality: Right;   OTHER SURGICAL HISTORY     TENS implantation for Chronic Interstitial Cystitis   PARTIAL GASTRECTOMY      Social History  reports that she has been smoking cigarettes. She has been smoking an average of .5 packs per day. She has never used smokeless tobacco. She reports that she does not currently use alcohol. She reports that she does not use drugs.  Allergies  Allergen Reactions   Sulfamethoxazole Anaphylaxis   Morphine And Codeine Other (See Comments)    Delirium. Makes me go "crazy"   Xanax Xr [Alprazolam Er] Other (See Comments)    Enhanced depression; feels gloomy   Dilaudid [Hydromorphone] Nausea And Vomiting    "Severe vomiting "    Morphine And Codeine    Sulfa Antibiotics     Family History  Problem Relation Age of Onset   Clotting disorder Mother    Crohn's disease Mother    Heart disease Mother    Emphysema Mother    Heart disease Father    Breast cancer Maternal Aunt    Colon cancer Neg Hx    Stomach cancer Neg Hx    Rectal cancer Neg Hx    Esophageal cancer Neg Hx   Reviewed on admission  Prior to Admission medications   Medication Sig Start Date End Date Taking? Authorizing Provider  acetaminophen (TYLENOL) 325 MG tablet Take 650 mg by mouth every 6 (six) hours as needed for moderate pain.   Yes [provider]  albuterol (VENTOLIN HFA) 108 (90 Base) MCG/ACT inhaler Inhale 1-2 puffs into the lungs 4 (four) times daily as needed for shortness of breath or wheezing. 12/26/20  Yes [provider]  Aspirin-Salicylamide-Caffeine (BC HEADACHE PO) Take 1 packet by mouth daily as needed (pain/headache).    Yes [provider]  busPIRone (BUSPAR) 15 MG tablet Take  15 mg by mouth 2 (two) times daily. 12/28/20  Yes [provider]  cyclobenzaprine (FLEXERIL) 5 MG tablet Take 5 mg by mouth 3 (three) times daily. 12/19/20  Yes [provider]  fentaNYL (DURAGESIC) 12 MCG/HR Place 1 patch onto the skin every 3 (three) days. 06/05/21  Yes [provider]  gabapentin (NEURONTIN) 800 MG tablet Take 800 mg by mouth 3 (three) times daily. 12/16/20  Yes [provider]  hydrOXYzine (ATARAX/VISTARIL) 25 MG tablet Take 25-50 mg by mouth 3 (three) times daily. 02/08/17  Yes [provider]  naloxone (NARCAN) nasal spray 4 mg/0.1 mL Place 1 spray into the nose once as needed (opioid reversal). 10/08/20  Yes [provider]  ondansetron (ZOFRAN-ODT) 4 MG disintegrating tablet TAKE 1  TABLET (4 MG TOTAL) BY MOUTH EVERY 6 (SIX) HOURS AS NEEDED FOR NAUSEA OR VOMITING 01/08/21  Yes Pyrtle, Carie Caddy, MD  oxyCODONE (OXY IR/ROXICODONE) 5 MG immediate release tablet Take 5 mg by mouth 3 (three) times daily. 06/11/22  Yes [provider]  PARoxetine (PAXIL) 20 MG tablet Take 20 mg by mouth at bedtime.   Yes [provider]  polyvinyl alcohol (LIQUIFILM TEARS) 1.4 % ophthalmic solution Place 1 drop into both eyes as needed for dry eyes.   Yes [provider]  QUEtiapine (SEROQUEL) 200 MG tablet Take 200 mg by mouth at bedtime. 12/14/20  Yes [provider]  traZODone (DESYREL) 50 MG tablet Take 50 mg by mouth at bedtime as needed for sleep. 10/11/20  Yes [provider]  amLODipine (NORVASC) 5 MG tablet Take 1 tablet (5 mg total) by mouth daily. Patient not taking: Reported on 07/05/2022 01/15/21 05/15/21  Meredeth Ide, MD  dronabinol (MARINOL) 2.5 MG capsule TAKE TWO CAPSULES BY MOUTH EVERY MORNING AND 1 CAPSULE EVERY EVENING Patient not taking: Reported on 06/16/2021 02/25/21   Pyrtle, Carie Caddy, MD  levothyroxine (SYNTHROID) 25 MCG tablet Take 1 tablet (25 mcg total) by mouth daily before breakfast. Patient  not taking: Reported on 06/16/2021 01/15/21   Meredeth Ide, MD  nicotine polacrilex (NICORETTE) 2 MG gum Take 1 each (2 mg total) by mouth as needed for smoking cessation. Patient not taking: Reported on 06/16/2021 02/27/21   Omar Person, MD  pantoprazole (PROTONIX) 40 MG tablet Take 1 tablet (40 mg total) by mouth 2 (two) times daily. Patient taking differently: Take 40 mg by mouth daily. 01/05/21 01/05/22  Ghimire, Werner Lean, MD  promethazine (PHENERGAN) 25 MG tablet Take 1 tablet (25 mg total) by mouth 3 (three) times daily as needed. Patient not taking: Reported on 06/16/2021 08/27/20   Pyrtle, Carie Caddy, MD  sucralfate (CARAFATE) 1 GM/10ML suspension Take 10 mLs (1 g total) by mouth 4 (four) times daily -  with meals and at bedtime. Patient not taking: Reported on 06/16/2021 01/05/21   Maretta Bees, MD    Physical Exam: Vitals:   07/05/22 1230 07/05/22 1235 07/05/22 1245 07/05/22 1250  BP: 108/72  (!) 120/90   Resp: 17  15   Temp:  98.4 F (36.9 C)    TempSrc:  Oral    SpO2:    100%  Weight:      Height:        Physical Exam Constitutional:      General: She is not in acute distress.    Comments: Patient in pain when seen.  HENT:     Head: Normocephalic and atraumatic.     Mouth/Throat:     Mouth: Mucous membranes are moist.     Pharynx: Oropharynx is clear.  Eyes:     Extraocular Movements: Extraocular movements intact.     Pupils: Pupils are equal, round, and reactive to light.  Cardiovascular:     Rate and Rhythm: Normal rate and regular rhythm.     Pulses: Normal pulses.     Heart sounds: Normal heart sounds.  Pulmonary:     Effort: Pulmonary effort is normal. No respiratory distress.     Breath sounds: Normal breath sounds.  Abdominal:     General: Bowel sounds are normal. There is no distension.     Palpations: Abdomen is soft.     Tenderness: There is no abdominal tenderness.  Musculoskeletal:  General: No swelling or deformity.     Comments: Bilateral  lower extremities neurovascularly intact  Skin:    General: Skin is warm and dry.  Neurological:     General: No focal deficit present.     Mental Status: Mental status is at baseline.    Labs on Admission: I have personally reviewed following labs and imaging studies  CBC: Recent Labs  Lab 07/05/22 1218  WBC 10.6*  NEUTROABS 7.3  HGB 9.2*  HCT 32.1*  MCV 76.2*  PLT 276    Basic Metabolic Panel: Recent Labs  Lab 07/05/22 1218  NA 137  K 4.3  CL 103  CO2 22  GLUCOSE 70  BUN 12  CREATININE 0.52  CALCIUM 8.4*    GFR: Estimated Creatinine Clearance: 37.3 mL/min (by C-G formula based on SCr of 0.52 mg/dL).  Liver Function Tests: Recent Labs  Lab 07/05/22 1218  AST 10*  ALT 11  ALKPHOS 93  BILITOT 0.3  PROT 5.7*  ALBUMIN 2.4*    Urine analysis:    Component Value Date/Time   COLORURINE COLORLESS (A) 01/14/2021 1558   APPEARANCEUR CLEAR 01/14/2021 1558   LABSPEC 1.002 (L) 01/14/2021 1558   PHURINE 7.0 01/14/2021 1558   GLUCOSEU NEGATIVE 01/14/2021 1558   HGBUR NEGATIVE 01/14/2021 1558   BILIRUBINUR NEGATIVE 01/14/2021 1558   KETONESUR NEGATIVE 01/14/2021 1558   PROTEINUR NEGATIVE 01/14/2021 1558   UROBILINOGEN 0.2 03/07/2007 0325   NITRITE NEGATIVE 01/14/2021 1558   LEUKOCYTESUR NEGATIVE 01/14/2021 1558    Radiological Exams on Admission: DG Hip Unilat With Pelvis 2-3 Views Left  Result Date: 07/05/2022 CLINICAL DATA:  Left hip pain after fall. EXAM: DG HIP (WITH OR WITHOUT PELVIS) 2-3V LEFT COMPARISON:  None Available. FINDINGS: Mildly displaced intertrochanteric fracture is seen involving proximal left femur. No dislocation is noted. IMPRESSION: Mildly displaced intertrochanteric fracture of proximal left femur. Electronically Signed   By: Lupita Raider M.D.   On: 07/05/2022 14:40    EKG: Independently reviewed.  Sinus rhythm at 80 bpm.  Nonspecific T wave flattening.  Low voltage 1, aVL.  Assessment/Plan Principal Problem:   Closed displaced  intertrochanteric fracture of left femur (HCC) Active Problems:   GERD (gastroesophageal reflux disease)   Depression   Iron deficiency anemia   Chronic pain   COPD (chronic obstructive pulmonary disease) (HCC)   Hypothyroidism   Pulmonary fibrosis (HCC)   S/P small bowel resection   Anxiety   Hypertension   Fall Closed displaced intratrochanteric fracture of left femur > Patient fell down the stairs at church today after losing balance.  Had hip pain following but no other new pain. > Imaging showed mildly displaced trochanteric femur fracture on the left. > IV fentanyl not helping her acute pain.  Would like to try Dilaudid along with antinausea medication. > Orthopedics consulted in the ED and will see the patient. - Monitor on surgical unit overnight - Appreciate orthopedic surgery recommendations and assistance - Continue with home fentanyl and as needed oxycodone - As needed Dilaudid for severe/breakthrough pain - As needed naloxone for accidental overdose - Anesthesia consult for nerve block, hopefully will be able to come off of IV fentanyl - N.p.o. pending ortho input - Supportive care  Hx of Gastric ulcer Hx of gastric outlet obstruction History of esophageal aperistalsis Malnutrition > S/P partial gastric resection and jejunostomy, now status post jejunostomy takedown in 2023 - Nutrition consult  Hypertension - Not currently taking amlodipine  Hypothyroidism - Currently taking Synthroid  Chronic  pain > In the setting of chronic vertebral compression at T7 - Continue fentanyl patch, as needed oxycodone, as needed Dilaudid as above - Continue home gabapentin - Continue home Flexeril  Anemia > History of iron deficiency anemia.  Hemoglobin currently on the low end of her normal range at 9.2. -Trend CBC  Anxiety Depression OCD - Continue home BuSpar, Paxil, Seroquel, trazodone   COPD Pulmonary fibrosis secondary to recurrent aspiration in the past -  Continue as needed al GERD - Continue home PPI buterol  DVT prophylaxis: SCDs Code Status:   Full Family Communication:  None on admission  Disposition Plan:   Patient is from:  Home  Anticipated DC to:  Home  Anticipated DC date:  1 to 3 days  Anticipated DC barriers: None  Consults called:  Orthopedic surgery Admission status:  Observation, telemetry  Severity of Illness: The appropriate patient status for this patient is OBSERVATION. Observation status is judged to be reasonable and necessary in order to provide the required intensity of service to ensure the patient's safety. The patient's presenting symptoms, physical exam findings, and initial radiographic and laboratory data in the context of their medical condition is felt to place them at decreased risk for further clinical deterioration. Furthermore, it is anticipated that the patient will be medically stable for discharge from the hospital within 2 midnights of admission.    Synetta Fail MD [facility]  How to contact the [facility] Attending or Consulting provider 7A - 7P or covering provider during after hours 7P -7A, for this patient?   Check the care team in [facility] and look for a) attending/consulting [facility] provider listed and b) the [facility] team listed Log into [email or web address] and use [place]'s universal password to access. If you do not have the password, please contact the hospital operator. Locate the [facility] provider you are looking for under [facility] and page to a number that you can be directly reached. If you still have difficulty reaching the provider, please page the [facility] (Director on Call) for the Hospitalists listed on amion for assistance.  07/05/2022, 3:41 PM

## 2022-07-06 ENCOUNTER — Encounter (HOSPITAL_COMMUNITY)
Admission: EM | Disposition: A | Discharge status: Home Health | Payer: Self-pay | Source: Home / Self Care | Attending: Internal Medicine

## 2022-07-06 ENCOUNTER — Encounter (HOSPITAL_COMMUNITY): Payer: Self-pay | Admitting: Anesthesiology

## 2022-07-06 ENCOUNTER — Encounter (HOSPITAL_COMMUNITY): Payer: Self-pay | Admitting: Internal Medicine

## 2022-07-06 ENCOUNTER — Observation Stay (HOSPITAL_COMMUNITY): Payer: Medicare Other | Admitting: Anesthesiology

## 2022-07-06 ENCOUNTER — Other Ambulatory Visit: Payer: Self-pay

## 2022-07-06 ENCOUNTER — Inpatient Hospital Stay (HOSPITAL_COMMUNITY): Payer: Medicare Other

## 2022-07-06 ENCOUNTER — Observation Stay (HOSPITAL_COMMUNITY): Payer: Medicare Other

## 2022-07-06 DIAGNOSIS — D500 Iron deficiency anemia secondary to blood loss (chronic): Secondary | ICD-10-CM | POA: Diagnosis not present

## 2022-07-06 DIAGNOSIS — J449 Chronic obstructive pulmonary disease, unspecified: Secondary | ICD-10-CM

## 2022-07-06 DIAGNOSIS — F429 Obsessive-compulsive disorder, unspecified: Secondary | ICD-10-CM | POA: Diagnosis present

## 2022-07-06 DIAGNOSIS — K2100 Gastro-esophageal reflux disease with esophagitis, without bleeding: Secondary | ICD-10-CM | POA: Diagnosis not present

## 2022-07-06 DIAGNOSIS — F17210 Nicotine dependence, cigarettes, uncomplicated: Secondary | ICD-10-CM

## 2022-07-06 DIAGNOSIS — S72102A Unspecified trochanteric fracture of left femur, initial encounter for closed fracture: Secondary | ICD-10-CM | POA: Diagnosis not present

## 2022-07-06 DIAGNOSIS — F32A Depression, unspecified: Secondary | ICD-10-CM | POA: Diagnosis present

## 2022-07-06 DIAGNOSIS — K219 Gastro-esophageal reflux disease without esophagitis: Secondary | ICD-10-CM | POA: Diagnosis present

## 2022-07-06 DIAGNOSIS — S72142D Displaced intertrochanteric fracture of left femur, subsequent encounter for closed fracture with routine healing: Secondary | ICD-10-CM | POA: Diagnosis not present

## 2022-07-06 DIAGNOSIS — E039 Hypothyroidism, unspecified: Secondary | ICD-10-CM | POA: Diagnosis present

## 2022-07-06 DIAGNOSIS — K5989 Other specified functional intestinal disorders: Secondary | ICD-10-CM | POA: Diagnosis not present

## 2022-07-06 DIAGNOSIS — E559 Vitamin D deficiency, unspecified: Secondary | ICD-10-CM | POA: Diagnosis present

## 2022-07-06 DIAGNOSIS — J8410 Pulmonary fibrosis, unspecified: Secondary | ICD-10-CM | POA: Diagnosis present

## 2022-07-06 DIAGNOSIS — Z7189 Other specified counseling: Secondary | ICD-10-CM | POA: Diagnosis not present

## 2022-07-06 DIAGNOSIS — F419 Anxiety disorder, unspecified: Secondary | ICD-10-CM | POA: Diagnosis present

## 2022-07-06 DIAGNOSIS — Z681 Body mass index (BMI) 19 or less, adult: Secondary | ICD-10-CM | POA: Diagnosis not present

## 2022-07-06 DIAGNOSIS — Z885 Allergy status to narcotic agent status: Secondary | ICD-10-CM | POA: Diagnosis not present

## 2022-07-06 DIAGNOSIS — Z888 Allergy status to other drugs, medicaments and biological substances status: Secondary | ICD-10-CM | POA: Diagnosis not present

## 2022-07-06 DIAGNOSIS — I10 Essential (primary) hypertension: Secondary | ICD-10-CM | POA: Diagnosis present

## 2022-07-06 DIAGNOSIS — Z79890 Hormone replacement therapy (postmenopausal): Secondary | ICD-10-CM | POA: Diagnosis not present

## 2022-07-06 DIAGNOSIS — Z4789 Encounter for other orthopedic aftercare: Secondary | ICD-10-CM | POA: Diagnosis not present

## 2022-07-06 DIAGNOSIS — Z79899 Other long term (current) drug therapy: Secondary | ICD-10-CM | POA: Diagnosis not present

## 2022-07-06 DIAGNOSIS — S72142A Displaced intertrochanteric fracture of left femur, initial encounter for closed fracture: Secondary | ICD-10-CM | POA: Diagnosis present

## 2022-07-06 DIAGNOSIS — I959 Hypotension, unspecified: Secondary | ICD-10-CM | POA: Diagnosis not present

## 2022-07-06 DIAGNOSIS — S7292XA Unspecified fracture of left femur, initial encounter for closed fracture: Secondary | ICD-10-CM | POA: Insufficient documentation

## 2022-07-06 DIAGNOSIS — M25552 Pain in left hip: Secondary | ICD-10-CM | POA: Diagnosis present

## 2022-07-06 DIAGNOSIS — D509 Iron deficiency anemia, unspecified: Secondary | ICD-10-CM | POA: Diagnosis present

## 2022-07-06 DIAGNOSIS — Z8249 Family history of ischemic heart disease and other diseases of the circulatory system: Secondary | ICD-10-CM | POA: Diagnosis not present

## 2022-07-06 DIAGNOSIS — M797 Fibromyalgia: Secondary | ICD-10-CM | POA: Diagnosis present

## 2022-07-06 DIAGNOSIS — Z515 Encounter for palliative care: Secondary | ICD-10-CM | POA: Diagnosis not present

## 2022-07-06 DIAGNOSIS — W108XXA Fall (on) (from) other stairs and steps, initial encounter: Secondary | ICD-10-CM | POA: Diagnosis present

## 2022-07-06 DIAGNOSIS — E43 Unspecified severe protein-calorie malnutrition: Secondary | ICD-10-CM | POA: Diagnosis present

## 2022-07-06 DIAGNOSIS — G894 Chronic pain syndrome: Secondary | ICD-10-CM | POA: Diagnosis present

## 2022-07-06 DIAGNOSIS — Y9222 Religious institution as the place of occurrence of the external cause: Secondary | ICD-10-CM | POA: Diagnosis not present

## 2022-07-06 DIAGNOSIS — Z882 Allergy status to sulfonamides status: Secondary | ICD-10-CM | POA: Diagnosis not present

## 2022-07-06 DIAGNOSIS — D62 Acute posthemorrhagic anemia: Secondary | ICD-10-CM | POA: Diagnosis not present

## 2022-07-06 DIAGNOSIS — I9589 Other hypotension: Secondary | ICD-10-CM | POA: Diagnosis not present

## 2022-07-06 HISTORY — PX: INTRAMEDULLARY (IM) NAIL INTERTROCHANTERIC: SHX5875

## 2022-07-06 LAB — CREATININE, SERUM
Creatinine, Ser: 0.74 mg/dL (ref 0.44–1.00)
GFR, Estimated: >60 mL/min (ref >60)

## 2022-07-06 LAB — CBC
HCT: 33.1 % — ABNORMAL LOW (ref 36.0–46.0)
Hemoglobin: 9.3 g/dL — ABNORMAL LOW (ref 12.0–15.0)
MCH: 21.9 pg — ABNORMAL LOW (ref 26.0–34.0)
MCHC: 28.1 g/dL — ABNORMAL LOW (ref 30.0–36.0)
MCV: 78.1 fL — ABNORMAL LOW (ref 80.0–100.0)
Platelets: 230 10*3/uL (ref 150–400)
RBC: 4.24 MIL/uL (ref 3.87–5.11)
RDW: 16.8 % — ABNORMAL HIGH (ref 11.5–15.5)
WBC: 14 10*3/uL — ABNORMAL HIGH (ref 4.0–10.5)
nRBC: 0 % (ref 0.0–0.2)

## 2022-07-06 LAB — VITAMIN D 25 HYDROXY (VIT D DEFICIENCY, FRACTURES): Vit D, 25-Hydroxy: 12.23 ng/mL — ABNORMAL LOW (ref 30–100)

## 2022-07-06 LAB — SURGICAL PCR SCREEN
MRSA, PCR: NEGATIVE
Staphylococcus aureus: NEGATIVE

## 2022-07-06 SURGERY — FIXATION, FRACTURE, INTERTROCHANTERIC, WITH INTRAMEDULLARY ROD
Anesthesia: General | Site: Hip | Laterality: Left

## 2022-07-06 MED ORDER — PHENYLEPHRINE HCL-NACL 20-0.9 MG/250ML-% IV SOLN
INTRAVENOUS | Status: DC | PRN
  Administered 2022-07-06: 25 ug/min via INTRAVENOUS

## 2022-07-06 MED ORDER — SUCCINYLCHOLINE CHLORIDE 200 MG/10ML IV SOSY
PREFILLED_SYRINGE | INTRAVENOUS | Status: DC | PRN
  Administered 2022-07-06: 60 mg via INTRAVENOUS

## 2022-07-06 MED ORDER — GABAPENTIN 300 MG PO CAPS: 300.0000 mg | ORAL_CAPSULE | Freq: Once | ORAL | Status: AC

## 2022-07-06 MED ORDER — ACETAMINOPHEN 500 MG PO TABS
ORAL_TABLET | ORAL | Status: AC
  Administered 2022-07-06: 500 mg via ORAL
  Filled 2022-07-06: qty 2

## 2022-07-06 MED ORDER — TRANEXAMIC ACID-NACL 1000-0.7 MG/100ML-% IV SOLN
1000.0000 mg | Freq: Once | INTRAVENOUS | Status: AC
  Administered 2022-07-06: 1000 mg via INTRAVENOUS
  Filled 2022-07-06: qty 100

## 2022-07-06 MED ORDER — PROMETHAZINE HCL 25 MG/ML IJ SOLN: 6.2500 mg | INTRAMUSCULAR | Status: DC | PRN

## 2022-07-06 MED ORDER — ACETAMINOPHEN 325 MG PO TABS
325.0000 mg | ORAL_TABLET | Freq: Four times a day (QID) | ORAL | Status: DC | PRN
  Administered 2022-07-07: 650 mg via ORAL
  Filled 2022-07-06: qty 2

## 2022-07-06 MED ORDER — AMISULPRIDE (ANTIEMETIC) 5 MG/2ML IV SOLN: 10.0000 mg | Freq: Once | INTRAVENOUS | Status: DC | PRN

## 2022-07-06 MED ORDER — GABAPENTIN 300 MG PO CAPS
ORAL_CAPSULE | ORAL | Status: AC
  Administered 2022-07-06: 300 mg via ORAL
  Filled 2022-07-06: qty 1

## 2022-07-06 MED ORDER — LIDOCAINE 2% (20 MG/ML) 5 ML SYRINGE
INTRAMUSCULAR | Status: AC
  Filled 2022-07-06: qty 5

## 2022-07-06 MED ORDER — ONDANSETRON HCL 4 MG/2ML IJ SOLN
4.0000 mg | Freq: Four times a day (QID) | INTRAMUSCULAR | Status: DC | PRN
  Filled 2022-07-06: qty 2

## 2022-07-06 MED ORDER — ACETAMINOPHEN 500 MG PO TABS: 500.0000 mg | ORAL_TABLET | Freq: Once | ORAL | Status: AC

## 2022-07-06 MED ORDER — CHLORHEXIDINE GLUCONATE 0.12 % MT SOLN: 15.0000 mL | Freq: Once | OROMUCOSAL | Status: AC

## 2022-07-06 MED ORDER — ORAL CARE MOUTH RINSE: 15.0000 mL | Freq: Once | OROMUCOSAL | Status: AC

## 2022-07-06 MED ORDER — ROCURONIUM BROMIDE 10 MG/ML (PF) SYRINGE
PREFILLED_SYRINGE | INTRAVENOUS | Status: AC
  Filled 2022-07-06: qty 10

## 2022-07-06 MED ORDER — ONDANSETRON HCL 4 MG/2ML IJ SOLN
INTRAMUSCULAR | Status: DC | PRN
  Administered 2022-07-06: 4 mg via INTRAVENOUS

## 2022-07-06 MED ORDER — CEFAZOLIN SODIUM-DEXTROSE 2-4 GM/100ML-% IV SOLN
2.0000 g | INTRAVENOUS | Status: AC
  Administered 2022-07-06: 2 g via INTRAVENOUS

## 2022-07-06 MED ORDER — FENTANYL CITRATE (PF) 100 MCG/2ML IJ SOLN
INTRAMUSCULAR | Status: AC
  Filled 2022-07-06: qty 2

## 2022-07-06 MED ORDER — DEXAMETHASONE SODIUM PHOSPHATE 10 MG/ML IJ SOLN
INTRAMUSCULAR | Status: AC
  Filled 2022-07-06: qty 1

## 2022-07-06 MED ORDER — ADULT MULTIVITAMIN W/MINERALS CH
1.0000 | ORAL_TABLET | Freq: Every day | ORAL | Status: DC
  Administered 2022-07-06 – 2022-07-08 (×3): 1 via ORAL
  Filled 2022-07-06 (×3): qty 1

## 2022-07-06 MED ORDER — METOCLOPRAMIDE HCL 5 MG/ML IJ SOLN: 5.0000 mg | Freq: Three times a day (TID) | INTRAMUSCULAR | Status: DC | PRN

## 2022-07-06 MED ORDER — LACTATED RINGERS IV SOLN: INTRAVENOUS | Status: DC

## 2022-07-06 MED ORDER — ROCURONIUM BROMIDE 10 MG/ML (PF) SYRINGE
PREFILLED_SYRINGE | INTRAVENOUS | Status: DC | PRN
  Administered 2022-07-06: 20 mg via INTRAVENOUS

## 2022-07-06 MED ORDER — ENOXAPARIN SODIUM 300 MG/3ML IJ SOLN
15.0000 mg | INTRAMUSCULAR | Status: DC
  Administered 2022-07-07: 15 mg via SUBCUTANEOUS
  Filled 2022-07-06 (×2): qty 0.15

## 2022-07-06 MED ORDER — PROPOFOL 10 MG/ML IV BOLUS
INTRAVENOUS | Status: DC | PRN
  Administered 2022-07-06: 80 mg via INTRAVENOUS

## 2022-07-06 MED ORDER — CEFAZOLIN SODIUM-DEXTROSE 2-4 GM/100ML-% IV SOLN
INTRAVENOUS | Status: AC
  Filled 2022-07-06: qty 100

## 2022-07-06 MED ORDER — POVIDONE-IODINE 10 % EX SWAB
2.0000 | Freq: Once | CUTANEOUS | Status: AC
  Administered 2022-07-06: 2 via TOPICAL

## 2022-07-06 MED ORDER — FENTANYL CITRATE (PF) 250 MCG/5ML IJ SOLN
INTRAMUSCULAR | Status: DC | PRN
  Administered 2022-07-06: 25 ug via INTRAVENOUS
  Administered 2022-07-06: 50 ug via INTRAVENOUS

## 2022-07-06 MED ORDER — CHLORHEXIDINE GLUCONATE 4 % EX SOLN: 60.0000 mL | Freq: Once | CUTANEOUS | Status: DC

## 2022-07-06 MED ORDER — ONDANSETRON HCL 4 MG PO TABS: 4.0000 mg | ORAL_TABLET | Freq: Four times a day (QID) | ORAL | Status: DC | PRN

## 2022-07-06 MED ORDER — MIDAZOLAM HCL 5 MG/5ML IJ SOLN
INTRAMUSCULAR | Status: DC | PRN
  Administered 2022-07-06 (×2): .5 mg via INTRAVENOUS

## 2022-07-06 MED ORDER — LIDOCAINE 2% (20 MG/ML) 5 ML SYRINGE
INTRAMUSCULAR | Status: DC | PRN
  Administered 2022-07-06: 20 mg via INTRAVENOUS

## 2022-07-06 MED ORDER — MIDAZOLAM HCL 2 MG/2ML IJ SOLN
INTRAMUSCULAR | Status: AC
  Filled 2022-07-06: qty 2

## 2022-07-06 MED ORDER — ONDANSETRON HCL 4 MG/2ML IJ SOLN
INTRAMUSCULAR | Status: AC
  Filled 2022-07-06: qty 2

## 2022-07-06 MED ORDER — CHLORHEXIDINE GLUCONATE 0.12 % MT SOLN
OROMUCOSAL | Status: AC
  Administered 2022-07-06: 15 mL via OROMUCOSAL
  Filled 2022-07-06: qty 15

## 2022-07-06 MED ORDER — DOCUSATE SODIUM 100 MG PO CAPS
100.0000 mg | ORAL_CAPSULE | Freq: Two times a day (BID) | ORAL | Status: DC
  Administered 2022-07-06 – 2022-07-08 (×5): 100 mg via ORAL
  Filled 2022-07-06 (×5): qty 1

## 2022-07-06 MED ORDER — 0.9 % SODIUM CHLORIDE (POUR BTL) OPTIME
TOPICAL | Status: DC | PRN
  Administered 2022-07-06: 1000 mL

## 2022-07-06 MED ORDER — FENTANYL CITRATE (PF) 250 MCG/5ML IJ SOLN
INTRAMUSCULAR | Status: AC
  Filled 2022-07-06: qty 5

## 2022-07-06 MED ORDER — BOOST / RESOURCE BREEZE PO LIQD CUSTOM
1.0000 | Freq: Three times a day (TID) | ORAL | Status: DC
  Administered 2022-07-06 – 2022-07-07 (×3): 1 via ORAL

## 2022-07-06 MED ORDER — FENTANYL CITRATE (PF) 100 MCG/2ML IJ SOLN
25.0000 ug | INTRAMUSCULAR | Status: DC | PRN
  Administered 2022-07-06: 25 ug via INTRAVENOUS

## 2022-07-06 MED ORDER — PROPOFOL 10 MG/ML IV BOLUS
INTRAVENOUS | Status: AC
  Filled 2022-07-06: qty 20

## 2022-07-06 MED ORDER — SODIUM CHLORIDE 0.9 % IV SOLN: INTRAVENOUS | Status: DC

## 2022-07-06 MED ORDER — DEXAMETHASONE SODIUM PHOSPHATE 10 MG/ML IJ SOLN
INTRAMUSCULAR | Status: DC | PRN
  Administered 2022-07-06: 4 mg via INTRAVENOUS

## 2022-07-06 MED ORDER — CHLORHEXIDINE GLUCONATE CLOTH 2 % EX PADS
6.0000 | MEDICATED_PAD | Freq: Every day | CUTANEOUS | Status: DC
  Administered 2022-07-07 – 2022-07-08 (×2): 6 via TOPICAL

## 2022-07-06 MED ORDER — SUGAMMADEX SODIUM 200 MG/2ML IV SOLN
INTRAVENOUS | Status: DC | PRN
  Administered 2022-07-06: 100 mg via INTRAVENOUS

## 2022-07-06 MED ORDER — METOCLOPRAMIDE HCL 5 MG PO TABS
5.0000 mg | ORAL_TABLET | Freq: Three times a day (TID) | ORAL | Status: DC | PRN
  Administered 2022-07-07: 5 mg via ORAL
  Filled 2022-07-06: qty 1

## 2022-07-06 MED ORDER — CEFAZOLIN SODIUM-DEXTROSE 2-4 GM/100ML-% IV SOLN
2.0000 g | Freq: Three times a day (TID) | INTRAVENOUS | Status: AC
  Administered 2022-07-06 – 2022-07-07 (×3): 2 g via INTRAVENOUS
  Filled 2022-07-06 (×3): qty 100

## 2022-07-06 SURGICAL SUPPLY — 52 items
ADH SKN CLS APL DERMABOND .7 (GAUZE/BANDAGES/DRESSINGS) ×1
ADH SKNCLS APL OCTYL .7 VIOL (GAUZE/BANDAGES/DRESSINGS) ×1
APL PRP STRL LF DISP 70% ISPRP (MISCELLANEOUS) ×1
BAG COUNTER SPONGE SURGICOUNT (BAG) IMPLANT
BAG SPNG CNTER NS LX DISP (BAG) ×1
BIT DRILL INTERTAN LAG SCREW (BIT) IMPLANT
BIT DRILL LONG 4.0 (BIT) IMPLANT
BRUSH SCRUB EZ PLAIN DRY (MISCELLANEOUS) ×4 IMPLANT
CHLORAPREP W/TINT 26 (MISCELLANEOUS) ×2 IMPLANT
COVER PERINEAL POST (MISCELLANEOUS) ×2 IMPLANT
COVER SURGICAL LIGHT HANDLE (MISCELLANEOUS) ×2 IMPLANT
DERMABOND ADVANCED .7 DNX12 (GAUZE/BANDAGES/DRESSINGS) ×2 IMPLANT
DRAPE C-ARM 35X43 STRL (DRAPES) ×2 IMPLANT
DRAPE IMP U-DRAPE 54X76 (DRAPES) ×4 IMPLANT
DRAPE INCISE IOBAN 66X45 STRL (DRAPES) ×2 IMPLANT
DRAPE STERI IOBAN 125X83 (DRAPES) ×2 IMPLANT
DRAPE SURG 17X23 STRL (DRAPES) ×4 IMPLANT
DRAPE U-SHAPE 47X51 STRL (DRAPES) ×2 IMPLANT
DRESSING MEPILEX FLEX 4X4 (GAUZE/BANDAGES/DRESSINGS) ×2 IMPLANT
DRILL BIT LONG 4.0 (BIT) ×1
DRSG MEPILEX FLEX 4X4 (GAUZE/BANDAGES/DRESSINGS) ×1
DRSG MEPILEX POST OP 4X8 (GAUZE/BANDAGES/DRESSINGS) ×2 IMPLANT
DRSG MEPITEL 4X7.2 (GAUZE/BANDAGES/DRESSINGS) IMPLANT
ELECT REM PT RETURN 9FT ADLT (ELECTROSURGICAL) ×1
ELECTRODE REM PT RTRN 9FT ADLT (ELECTROSURGICAL) ×2 IMPLANT
GLOVE BIO SURGEON STRL SZ 6.5 (GLOVE) ×6 IMPLANT
GLOVE BIO SURGEON STRL SZ7.5 (GLOVE) ×8 IMPLANT
GLOVE BIOGEL PI IND STRL 6.5 (GLOVE) ×2 IMPLANT
GLOVE BIOGEL PI IND STRL 7.5 (GLOVE) ×2 IMPLANT
GOWN STRL REUS W/ TWL LRG LVL3 (GOWN DISPOSABLE) ×2 IMPLANT
GOWN STRL REUS W/TWL LRG LVL3 (GOWN DISPOSABLE) ×1
GUIDE PIN 3.2X343 (PIN) ×2
GUIDE PIN 3.2X343MM (PIN) ×2
GUIDE ROD 3.0 (MISCELLANEOUS) ×1
KIT BASIN OR (CUSTOM PROCEDURE TRAY) ×2 IMPLANT
KIT TURNOVER KIT B (KITS) ×2 IMPLANT
MANIFOLD NEPTUNE II (INSTRUMENTS) ×2 IMPLANT
NAIL INTERTAN 10X18 130D 10S (Nail) IMPLANT
NS IRRIG 1000ML POUR BTL (IV SOLUTION) ×2 IMPLANT
PACK GENERAL/GYN (CUSTOM PROCEDURE TRAY) ×2 IMPLANT
PAD ARMBOARD 7.5X6 YLW CONV (MISCELLANEOUS) ×4 IMPLANT
PIN GUIDE 3.2X343MM (PIN) IMPLANT
ROD GUIDE 3.0 (MISCELLANEOUS) IMPLANT
SCREW LAG COMPR KIT 80/75 (Screw) IMPLANT
SCREW TRIGEN LOW PROF 5.0X30 (Screw) IMPLANT
SUT MNCRL AB 3-0 PS2 18 (SUTURE) ×2 IMPLANT
SUT VIC AB 0 CT1 27 (SUTURE) ×1
SUT VIC AB 0 CT1 27XBRD ANBCTR (SUTURE) IMPLANT
SUT VIC AB 2-0 CT1 27 (SUTURE) ×1
SUT VIC AB 2-0 CT1 TAPERPNT 27 (SUTURE) ×4 IMPLANT
TOWEL GREEN STERILE (TOWEL DISPOSABLE) ×4 IMPLANT
WATER STERILE IRR 1000ML POUR (IV SOLUTION) ×2 IMPLANT

## 2022-07-06 NOTE — Op Note (Signed)
Orthopaedic Surgery Operative Note (CSN: [id number] ) Date of Surgery: 07/06/2022  Admit Date: 07/05/2022   Diagnoses: Pre-Op Diagnoses: Left intertrochanteric femur fracture   Post-Op Diagnosis: Same  Procedures: CPT 27245-Cephalomedullary nailing of left intertrochanteric femur fracture  Surgeons : Primary: Roby Lofts, MD  Assistant: Ulyses Southward, PA-C  Location: OR 3   Anesthesia: General   Antibiotics: Ancef 2g preop   Tourniquet time: None    Estimated Blood Loss: 150 mL  Complications: None   Specimens: None   Implants: Implant Name Type Inv. Item Serial No. Manufacturer Lot No. LRB No. Used Action  NAIL INTERTAN 10X18 130D 10S - [id number] Nail NAIL INTERTAN 10X18 130D 10S  [employer] [id number] Left 1 Implanted  SCREW LAG COMPR KIT 80/75 - [id number] Screw SCREW LAG COMPR KIT 80/75  [employer] [id number] Left 1 Implanted  SCREW TRIGEN LOW PROF 5.0X30 - [id number] Screw SCREW TRIGEN LOW PROF 5.0X30  [employer] [id number] Left 1 Implanted     Indications for Surgery: 52 year old female who sustained a fall down a few stairs sustaining a left intertrochanteric femur fracture.  Due to the unstable nature of her injury I recommend proceeding with cephalomedullary nailing of her left hip.  Risks and benefits were discussed with the patient.  Risks included but not limited to bleeding, infection, malunion, nonunion, hardware failure, hardware rotation, nerve or blood vessel injury, DVT, even the possibility anesthetic complications.  She agreed to proceed with surgery and consent was obtained.  Operative Findings: Cephalomedullary nailing of left intertrochanteric femur fracture using [employer] InterTAN 10 x 180 mm nail with a 80 mm lag screw.  Procedure: The patient was identified in the preoperative holding area. Consent was confirmed with the patient and their family and all questions were answered. The  operative extremity was marked after confirmation with the patient. she was then brought back to the operating room by our anesthesia colleagues.  She was placed under general anesthetic and carefully transferred over to a Hana table.  All bony prominences were well-padded.  Traction was applied to the lower extremity and fluoroscopic imaging showed adequate alignment of the fracture.  The left lower extremity was then prepped and draped in usual sterile fashion.  A timeout was performed to verify the patient, the procedure, and the extremity.  Preoperative antibiotics were dosed.  A small incision proximal to the greater trochanter was made and carried down through skin and subcutaneous tissue.  A threaded guidewire was directed at the tip of the greater trochanter and advanced into the proximal metaphysis.  An entry reamer was then used to enter the medullary canal.  A ball-tipped guidewire was placed down the center of the canal and I used a 10 mm reamer to ream making sure that I did not obtain much interference.  I then proceeded to put a 10 x 180 mm nail down the center of the canal and seated it until it was appropriately positioned.  I then used the targeting arm to direct a threaded guidewire into the head/neck segment.  I confirmed adequate tip apex distance and measured the length.  I chose to use an 80 mm lag screw.  I then drilled the path for the compression screw placed an antirotation bar.  I then drilled the path for the lag screw and placed a 80 mm lag screw.  I then placed the compression screw and compressed approximately 3 to 4 mm.  The proximal portion of the  nail was statically locked.  I then placed a distal interlocking screw using the targeting arm.  Final fluoroscopic imaging was obtained after the targeting arm was removed.  The incisions were copiously irrigated and closed with 2-0 Monocryl and Dermabond.  Sterile dressings were applied.  The patient was then awoken from anesthesia  and taken to the PACU in stable condition.  Post Op Plan/Instructions: Patient will be weightbearing as tolerated to the left lower extremity.  She will receive postoperative Ancef.  She will receive Lovenox for DVT prophylaxis and aspirin 81 mg for DVT prophylaxis upon discharge.  We will have her mobilize with physical and Occupational Therapy.  I was present and performed the entire surgery.  Ulyses Southward, PA-C did assist me throughout the case. An assistant was necessary given the difficulty in approach, maintenance of reduction and ability to instrument the fracture.   Truitt Merle, MD [facility]

## 2022-07-06 NOTE — Anesthesia Procedure Notes (Signed)
Procedure Name: Intubation Date/Time: 07/06/2022 9:59 AM  Performed by: Quentin Ore, CRNAPre-anesthesia Checklist: Patient identified, Emergency Drugs available, Suction available, Patient being monitored and Timeout performed Patient Re-evaluated:Patient Re-evaluated prior to induction Oxygen Delivery Method: Circle system utilized Preoxygenation: Pre-oxygenation with 100% oxygen Induction Type: IV induction, Rapid sequence and Cricoid Pressure applied Laryngoscope Size: Mac and 3 Grade View: Grade I Tube type: Oral Tube size: 7.0 mm Number of attempts: 1 Airway Equipment and Method: Stylet Placement Confirmation: ETT inserted through vocal cords under direct vision, positive ETCO2 and breath sounds checked- equal and bilateral Secured at: 20 cm Tube secured with: Tape Dental Injury: Teeth and Oropharynx as per pre-operative assessment  Comments: Intubation by Harmon Dun, SRNA with MDA and CRNA supervising.

## 2022-07-06 NOTE — H&P (View-Only) (Signed)
Reason for Consult:Left hip fx Referring Physician: Delrae Sawyers Arrien Time called: 0745 Time at bedside: 0830   [name] is an 52 y.o. female.  HPI: [name] was walking down some stairs at church yesterday and missed the last one.This caused her to lose her balance and fall. She had immediate left hip pain and could not get up. She was brought to the ED where x-rays showed a left hip fx and orthopedic surgery was consulted.   Past Medical History:  Diagnosis Date   Acute encephalopathy 01/14/2021   Acute respiratory failure with hypoxia (HCC) 01/03/2021   Allergic rhinitis    Altered mental status    Anemia    Anxiety    Anxiety disorder    Aspiration into airway    per pt hx on 08/07/19 aspirates food into lungs when asleep    Calcaneus fracture, right 06/19/2017   Chronic interstitial cystitis    Chronic pain 06/19/2017   Closed fracture of right distal tibia 06/17/2017   Community acquired pneumonia    COPD (chronic obstructive pulmonary disease) (HCC)    Depression    Fibromyalgia    Gastric outlet obstruction    Gastric ulcer    GERD (gastroesophageal reflux disease)    Hiatal hernia    History of kidney stones    Hypothyroidism    in the past   IBS (irritable bowel syndrome)    IDA (iron deficiency anemia)    Internal hemorrhoids    Kidney stones    Myalgia    OCD (obsessive compulsive disorder)    Pneumonia    Ulcerative esophagitis    Vitamin D deficiency     Past Surgical History:  Procedure Laterality Date   68 HOUR PH STUDY N/A 03/15/2019   Procedure: 24 HOUR PH STUDY;  Surgeon: Beverley Fiedler, MD;  [facility];  Service: Gastroenterology;  Laterality: N/A;   ABDOMINAL HYSTERECTOMY     BILROTH I PROCEDURE  06/22/2016   Dr Dimas Chyle---- due to gastric ulcer   ESOPHAGEAL MANOMETRY N/A 03/15/2019   Procedure: ESOPHAGEAL MANOMETRY (EM);  Surgeon: Beverley Fiedler, MD;  [facility];  Service: Gastroenterology;  Laterality: N/A;   EXTERNAL EAR  SURGERY     dont know specific ear sugery (inner/external)   FRACTURE SURGERY     right arm   gastrectomy and bilateral vagotomy      IR CM INJ ANY COLONIC TUBE W/FLUORO  12/30/2020   IR PATIENT EVAL TECH 0-60 MINS  08/29/2021   IR REPLC DUODEN/JEJUNO TUBE PERCUT W/FLUORO  09/15/2019   IR REPLC DUODEN/JEJUNO TUBE PERCUT W/FLUORO  07/23/2020   IR REPLC DUODEN/JEJUNO TUBE PERCUT W/FLUORO  09/23/2020   IR REPLC DUODEN/JEJUNO TUBE PERCUT W/FLUORO  12/27/2020   IR SINUS/FIST TUBE CHK-NON GI  03/14/2020   IR SINUS/FIST TUBE CHK-NON GI  06/05/2021   LAPAROSCOPIC JEJUNOSTOMY N/A 08/09/2019   Procedure: LAPAROSCOPIC FEEDING JEJUNOSTOMY;  Surgeon: Luretha Murphy, MD;  [facility];  Service: General;  Laterality: N/A;   LAPAROSCOPIC JEJUNOSTOMY N/A 06/24/2021   Procedure: LAPAROSCOPIC TAKEDOWN OF FEEDING JEJUNOSTOMY;  Surgeon: Luretha Murphy, MD;  [facility];  Service: General;  Laterality: N/A;   mutliple surgeries due to intertitial cystitis     bladder stimulator   ORIF TIBIA FRACTURE Right 06/17/2017   Procedure: OPEN REDUCTION INTERNAL FIXATION (ORIF) DISTAL TIBIA FRACTURE AND CALCANEOUS;  Surgeon: Myrene Galas, MD;  [facility];  Service: Orthopedics;  Laterality: Right;   OTHER SURGICAL HISTORY  TENS implantation for Chronic Interstitial Cystitis   PARTIAL GASTRECTOMY      Family History  Problem Relation Age of Onset   Clotting disorder Mother    Crohn's disease Mother    Heart disease Mother    Emphysema Mother    Heart disease Father    Breast cancer Maternal Aunt    Colon cancer Neg Hx    Stomach cancer Neg Hx    Rectal cancer Neg Hx    Esophageal cancer Neg Hx     Social History:  reports that she has been smoking cigarettes. She has been smoking an average of .5 packs per day. She has never used smokeless tobacco. She reports that she does not currently use alcohol. She reports that she does not use drugs.  Allergies:  Allergies  Allergen Reactions    Sulfamethoxazole Anaphylaxis   Morphine And Codeine Other (See Comments)    Delirium. Makes me go "crazy"   Xanax Xr [Alprazolam Er] Other (See Comments)    Enhanced depression; feels gloomy   Dilaudid [Hydromorphone] Nausea And Vomiting    "Severe vomiting "    Morphine And Codeine    Sulfa Antibiotics     Medications: I have reviewed the patient's current medications.  Results for orders placed or performed during the hospital encounter of 07/05/22 (from the past 48 hour(s))  CBC with Differential/Platelet     Status: Abnormal   Collection Time: 07/05/22 12:18 PM  Result Value Ref Range   WBC 10.6 (H) 4.0 - 10.5 K/uL   RBC 4.21 3.87 - 5.11 MIL/uL   Hemoglobin 9.2 (L) 12.0 - 15.0 g/dL   HCT 16.1 (L) 09.6 - 04.5 %   MCV 76.2 (L) 80.0 - 100.0 fL   MCH 21.9 (L) 26.0 - 34.0 pg   MCHC 28.7 (L) 30.0 - 36.0 g/dL   RDW 40.9 (H) 81.1 - 91.4 %   Platelets 276 150 - 400 K/uL   nRBC 0.0 0.0 - 0.2 %   Neutrophils Relative % 69 %   Neutro Abs 7.3 1.7 - 7.7 K/uL   Lymphocytes Relative 24 %   Lymphs Abs 2.5 0.7 - 4.0 K/uL   Monocytes Relative 6 %   Monocytes Absolute 0.6 0.1 - 1.0 K/uL   Eosinophils Relative 1 %   Eosinophils Absolute 0.1 0.0 - 0.5 K/uL   Basophils Relative 0 %   Basophils Absolute 0.0 0.0 - 0.1 K/uL   Immature Granulocytes 0 %   Abs Immature Granulocytes 0.04 0.00 - 0.07 K/uL    Comment: Performed at [facility], [address], [place], [place] [address]  Comprehensive metabolic panel     Status: Abnormal   Collection Time: 07/05/22 12:18 PM  Result Value Ref Range   Sodium 137 135 - 145 mmol/L   Potassium 4.3 3.5 - 5.1 mmol/L   Chloride 103 98 - 111 mmol/L   CO2 22 22 - 32 mmol/L   Glucose, Bld 70 70 - 99 mg/dL    Comment: Glucose reference range applies only to samples taken after fasting for at least 8 hours.   BUN 12 6 - 20 mg/dL   Creatinine, Ser 6.21 0.44 - 1.00 mg/dL   Calcium 8.4 (L) 8.9 - 10.3 mg/dL   Total Protein 5.7 (L) 6.5 - 8.1 g/dL    Albumin 2.4 (L) 3.5 - 5.0 g/dL   AST 10 (L) 15 - 41 U/L   ALT 11 0 - 44 U/L   Alkaline Phosphatase 93  38 - 126 U/L   Total Bilirubin 0.3 0.3 - 1.2 mg/dL   GFR, Estimated >86 >57 mL/min    Comment: (NOTE) Calculated using the CKD-EPI Creatinine Equation (2021)    Anion gap 12 5 - 15    Comment: Performed at [facility], [address] [place], [place] [address]  HIV Antibody (routine testing w rflx)     Status: None   Collection Time: 07/05/22  5:05 PM  Result Value Ref Range   HIV Screen 4th Generation wRfx Non Reactive Non Reactive    Comment: Performed at [facility], [address], [place], [place] [address]  Type and screen [facility]     Status: None   Collection Time: 07/05/22  5:05 PM  Result Value Ref Range   ABO/RH(D) O POS    Antibody Screen NEG    Sample Expiration      07/08/2022,2359 Performed at [facility], [address] [place], [place] [address]     DG Hip Unilat With Pelvis 2-3 Views Left  Result Date: 07/05/2022 CLINICAL DATA:  Left hip pain after fall. EXAM: DG HIP (WITH OR WITHOUT PELVIS) 2-3V LEFT COMPARISON:  None Available. FINDINGS: Mildly displaced intertrochanteric fracture is seen involving proximal left femur. No dislocation is noted. IMPRESSION: Mildly displaced intertrochanteric fracture of proximal left femur. Electronically Signed   By: Lupita Raider M.D.   On: 07/05/2022 14:40    Review of Systems  HENT:  Negative for ear discharge, ear pain, hearing loss and tinnitus.   Eyes:  Negative for photophobia and pain.  Respiratory:  Negative for cough and shortness of breath.   Cardiovascular:  Negative for chest pain.  Gastrointestinal:  Negative for abdominal pain, nausea and vomiting.  Genitourinary:  Negative for dysuria, flank pain, frequency and urgency.  Musculoskeletal:  Positive for arthralgias (Left hip). Negative for back pain, myalgias and neck pain.  Neurological:  Negative for dizziness and  headaches.  Hematological:  Does not bruise/bleed easily.  Psychiatric/Behavioral:  The patient is not nervous/anxious.    Blood pressure 94/72, pulse 94, temperature 98.8 F (37.1 C), temperature source Oral, resp. rate 11, height 4\' 11"  (1.499 m), weight 28.1 kg, SpO2 96 %. Physical Exam Constitutional:      General: She is not in acute distress.    Appearance: She is well-developed. She is not diaphoretic.  HENT:     Head: Normocephalic and atraumatic.  Eyes:     General: No scleral icterus.       Right eye: No discharge.        Left eye: No discharge.     Conjunctiva/sclera: Conjunctivae normal.  Cardiovascular:     Rate and Rhythm: Normal rate and regular rhythm.  Pulmonary:     Effort: Pulmonary effort is normal. No respiratory distress.  Musculoskeletal:     Cervical back: Normal range of motion.     Comments: LLE No traumatic wounds, ecchymosis, or rash  Mod TTP hip  No knee or ankle effusion  Knee stable to varus/ valgus and anterior/posterior stress  Sens DPN, SPN, TN intact  Motor EHL, ext, flex, evers 5/5  DP 1+, PT 1+, No significant edema  Skin:    General: Skin is warm and dry.  Neurological:     Mental Status: She is alert.  Psychiatric:        Mood and Affect: Mood normal.        Behavior: Behavior normal.  Assessment/Plan: Left hip fx -- Plan IMN today with Dr. Jena Gauss. Please keep NPO. Multiple medical problems including hypertension, hypothyroidism, COPD, chronic pain, vertebral compression fractures, anemia, aspiration, pulmonary fibrosis, GERD, depression, anxiety, OCD, gastric ulcer and gastric outlet obstruction status post partial gastric resection and jejunostomy now status post jejunostomy takedown -- per primary service    Freeman Caldron, PA-C Orthopedic Surgery [phone] 07/06/2022, 8:38 AM

## 2022-07-06 NOTE — Consult Note (Signed)
Reason for Consult:Left hip fx Referring Physician: Mauricio [name] Time called: 0745 Time at bedside: 0830   [name] is an 52 y.o. female.  HPI: [name] was walking down some stairs at church yesterday and missed the last one.This caused her to lose her balance and fall. She had immediate left hip pain and could not get up. She was brought to the ED where x-rays showed a left hip fx and orthopedic surgery was consulted.   Past Medical History:  Diagnosis Date   Acute encephalopathy 01/14/2021   Acute respiratory failure with hypoxia (HCC) 01/03/2021   Allergic rhinitis    Altered mental status    Anemia    Anxiety    Anxiety disorder    Aspiration into airway    per pt hx on 08/07/19 aspirates food into lungs when asleep    Calcaneus fracture, right 06/19/2017   Chronic interstitial cystitis    Chronic pain 06/19/2017   Closed fracture of right distal tibia 06/17/2017   Community acquired pneumonia    COPD (chronic obstructive pulmonary disease) (HCC)    Depression    Fibromyalgia    Gastric outlet obstruction    Gastric ulcer    GERD (gastroesophageal reflux disease)    Hiatal hernia    History of kidney stones    Hypothyroidism    in the past   IBS (irritable bowel syndrome)    IDA (iron deficiency anemia)    Internal hemorrhoids    Kidney stones    Myalgia    OCD (obsessive compulsive disorder)    Pneumonia    Ulcerative esophagitis    Vitamin D deficiency     Past Surgical History:  Procedure Laterality Date   24 HOUR PH STUDY N/A 03/15/2019   Procedure: 24 HOUR PH STUDY;  Surgeon: Pyrtle, Jay M, MD;  [facility];  Service: Gastroenterology;  Laterality: N/A;   ABDOMINAL HYSTERECTOMY     BILROTH I PROCEDURE  06/22/2016   Dr Gimenez---- due to gastric ulcer   ESOPHAGEAL MANOMETRY N/A 03/15/2019   Procedure: ESOPHAGEAL MANOMETRY (EM);  Surgeon: Pyrtle, Jay M, MD;  [facility];  Service: Gastroenterology;  Laterality: N/A;   EXTERNAL EAR  SURGERY     dont know specific ear sugery (inner/external)   FRACTURE SURGERY     right arm   gastrectomy and bilateral vagotomy      IR CM INJ ANY COLONIC TUBE W/FLUORO  12/30/2020   IR PATIENT EVAL TECH 0-60 MINS  08/29/2021   IR REPLC DUODEN/JEJUNO TUBE PERCUT W/FLUORO  09/15/2019   IR REPLC DUODEN/JEJUNO TUBE PERCUT W/FLUORO  07/23/2020   IR REPLC DUODEN/JEJUNO TUBE PERCUT W/FLUORO  09/23/2020   IR REPLC DUODEN/JEJUNO TUBE PERCUT W/FLUORO  12/27/2020   IR SINUS/FIST TUBE CHK-NON GI  03/14/2020   IR SINUS/FIST TUBE CHK-NON GI  06/05/2021   LAPAROSCOPIC JEJUNOSTOMY N/A 08/09/2019   Procedure: LAPAROSCOPIC FEEDING JEJUNOSTOMY;  Surgeon: Martin, Matthew, MD;  [facility];  Service: General;  Laterality: N/A;   LAPAROSCOPIC JEJUNOSTOMY N/A 06/24/2021   Procedure: LAPAROSCOPIC TAKEDOWN OF FEEDING JEJUNOSTOMY;  Surgeon: Martin, Matthew, MD;  [facility];  Service: General;  Laterality: N/A;   mutliple surgeries due to intertitial cystitis     bladder stimulator   ORIF TIBIA FRACTURE Right 06/17/2017   Procedure: OPEN REDUCTION INTERNAL FIXATION (ORIF) DISTAL TIBIA FRACTURE AND CALCANEOUS;  Surgeon: Handy, Zayanna Pundt, MD;  [facility];  Service: Orthopedics;  Laterality: Right;   OTHER SURGICAL HISTORY       TENS implantation for Chronic Interstitial Cystitis   PARTIAL GASTRECTOMY      Family History  Problem Relation Age of Onset   Clotting disorder Mother    Crohn's disease Mother    Heart disease Mother    Emphysema Mother    Heart disease Father    Breast cancer Maternal Aunt    Colon cancer Neg Hx    Stomach cancer Neg Hx    Rectal cancer Neg Hx    Esophageal cancer Neg Hx     Social History:  reports that she has been smoking cigarettes. She has been smoking an average of .5 packs per day. She has never used smokeless tobacco. She reports that she does not currently use alcohol. She reports that she does not use drugs.  Allergies:  Allergies  Allergen Reactions    Sulfamethoxazole Anaphylaxis   Morphine And Codeine Other (See Comments)    Delirium. Makes me go "crazy"   Xanax Xr [Alprazolam Er] Other (See Comments)    Enhanced depression; feels gloomy   Dilaudid [Hydromorphone] Nausea And Vomiting    "Severe vomiting "    Morphine And Codeine    Sulfa Antibiotics     Medications: I have reviewed the patient's current medications.  Results for orders placed or performed during the hospital encounter of 07/05/22 (from the past 48 hour(s))  CBC with Differential/Platelet     Status: Abnormal   Collection Time: 07/05/22 12:18 PM  Result Value Ref Range   WBC 10.6 (H) 4.0 - 10.5 K/uL   RBC 4.21 3.87 - 5.11 MIL/uL   Hemoglobin 9.2 (L) 12.0 - 15.0 g/dL   HCT 32.1 (L) 36.0 - 46.0 %   MCV 76.2 (L) 80.0 - 100.0 fL   MCH 21.9 (L) 26.0 - 34.0 pg   MCHC 28.7 (L) 30.0 - 36.0 g/dL   RDW 16.7 (H) 11.5 - 15.5 %   Platelets 276 150 - 400 K/uL   nRBC 0.0 0.0 - 0.2 %   Neutrophils Relative % 69 %   Neutro Abs 7.3 1.7 - 7.7 K/uL   Lymphocytes Relative 24 %   Lymphs Abs 2.5 0.7 - 4.0 K/uL   Monocytes Relative 6 %   Monocytes Absolute 0.6 0.1 - 1.0 K/uL   Eosinophils Relative 1 %   Eosinophils Absolute 0.1 0.0 - 0.5 K/uL   Basophils Relative 0 %   Basophils Absolute 0.0 0.0 - 0.1 K/uL   Immature Granulocytes 0 %   Abs Immature Granulocytes 0.04 0.00 - 0.07 K/uL    Comment: Performed at [facility], [address] [place],  [address]  Comprehensive metabolic panel     Status: Abnormal   Collection Time: 07/05/22 12:18 PM  Result Value Ref Range   Sodium 137 135 - 145 mmol/L   Potassium 4.3 3.5 - 5.1 mmol/L   Chloride 103 98 - 111 mmol/L   CO2 22 22 - 32 mmol/L   Glucose, Bld 70 70 - 99 mg/dL    Comment: Glucose reference range applies only to samples taken after fasting for at least 8 hours.   BUN 12 6 - 20 mg/dL   Creatinine, Ser 0.52 0.44 - 1.00 mg/dL   Calcium 8.4 (L) 8.9 - 10.3 mg/dL   Total Protein 5.7 (L) 6.5 - 8.1 g/dL    Albumin 2.4 (L) 3.5 - 5.0 g/dL   AST 10 (L) 15 - 41 U/L   ALT 11 0 - 44 U/L   Alkaline Phosphatase 93   38 - 126 U/L   Total Bilirubin 0.3 0.3 - 1.2 mg/dL   GFR, Estimated >60 >60 mL/min    Comment: (NOTE) Calculated using the CKD-EPI Creatinine Equation (2021)    Anion gap 12 5 - 15    Comment: Performed at [facility], [address] [place], [place] [address]  HIV Antibody (routine testing w rflx)     Status: None   Collection Time: 07/05/22  5:05 PM  Result Value Ref Range   HIV Screen 4th Generation wRfx Non Reactive Non Reactive    Comment: Performed at [facility], [address] [place], [place] [address]  Type and screen [facility]     Status: None   Collection Time: 07/05/22  5:05 PM  Result Value Ref Range   ABO/RH(D) O POS    Antibody Screen NEG    Sample Expiration      07/08/2022,2359 Performed at [facility], [address] , [place] [address]     DG Hip Unilat With Pelvis 2-3 Views Left  Result Date: 07/05/2022 CLINICAL DATA:  Left hip pain after fall. EXAM: DG HIP (WITH OR WITHOUT PELVIS) 2-3V LEFT COMPARISON:  None Available. FINDINGS: Mildly displaced intertrochanteric fracture is seen involving proximal left femur. No dislocation is noted. IMPRESSION: Mildly displaced intertrochanteric fracture of proximal left femur. Electronically Signed   By: James  Green Jr M.D.   On: 07/05/2022 14:40    Review of Systems  HENT:  Negative for ear discharge, ear pain, hearing loss and tinnitus.   Eyes:  Negative for photophobia and pain.  Respiratory:  Negative for cough and shortness of breath.   Cardiovascular:  Negative for chest pain.  Gastrointestinal:  Negative for abdominal pain, nausea and vomiting.  Genitourinary:  Negative for dysuria, flank pain, frequency and urgency.  Musculoskeletal:  Positive for arthralgias (Left hip). Negative for back pain, myalgias and neck pain.  Neurological:  Negative for dizziness and  headaches.  Hematological:  Does not bruise/bleed easily.  Psychiatric/Behavioral:  The patient is not nervous/anxious.    Blood pressure 94/72, pulse 94, temperature 98.8 F (37.1 C), temperature source Oral, resp. rate 11, height 4' 11" (1.499 m), weight 28.1 kg, SpO2 96 %. Physical Exam Constitutional:      General: She is not in acute distress.    Appearance: She is well-developed. She is not diaphoretic.  HENT:     Head: Normocephalic and atraumatic.  Eyes:     General: No scleral icterus.       Right eye: No discharge.        Left eye: No discharge.     Conjunctiva/sclera: Conjunctivae normal.  Cardiovascular:     Rate and Rhythm: Normal rate and regular rhythm.  Pulmonary:     Effort: Pulmonary effort is normal. No respiratory distress.  Musculoskeletal:     Cervical back: Normal range of motion.     Comments: LLE No traumatic wounds, ecchymosis, or rash  Mod TTP hip  No knee or ankle effusion  Knee stable to varus/ valgus and anterior/posterior stress  Sens DPN, SPN, TN intact  Motor EHL, ext, flex, evers 5/5  DP 1+, PT 1+, No significant edema  Skin:    General: Skin is warm and dry.  Neurological:     Mental Status: She is alert.  Psychiatric:        Mood and Affect: Mood normal.        Behavior: Behavior normal.       Assessment/Plan: Left hip fx -- Plan IMN today with Dr. Haddix. Please keep NPO. Multiple medical problems including hypertension, hypothyroidism, COPD, chronic pain, vertebral compression fractures, anemia, aspiration, pulmonary fibrosis, GERD, depression, anxiety, OCD, gastric ulcer and gastric outlet obstruction status post partial gastric resection and jejunostomy now status post jejunostomy takedown -- per primary service    Kamara Allan J. Cassandre Oleksy, PA-C Orthopedic Surgery [phone] 07/06/2022, 8:38 AM  

## 2022-07-06 NOTE — Assessment & Plan Note (Signed)
Old records personally reviewed, neurology follow up from 10/2020. Seropositive ocular myasthenia gravis, no significant bulbar, or limb weakness noted.  He is not longer on Mestinon or prednisone, never treated with long term steroids sparing agent.   

## 2022-07-06 NOTE — Assessment & Plan Note (Signed)
Follow up on iron panel as outpatient.

## 2022-07-06 NOTE — Interval H&P Note (Signed)
History and Physical Interval Note:  07/06/2022 9:09 AM  [name]  has presented today for surgery, with the diagnosis of left hip fracture.  The various methods of treatment have been discussed with the patient and family. After consideration of risks, benefits and other options for treatment, the patient has consented to  Procedure(s): LEFT INTRAMEDULLARY (IM) NAIL INTERTROCHANTERIC (Left) as a surgical intervention.  The patient's history has been reviewed, patient examined, no change in status, stable for surgery.  I have reviewed the patient's chart and labs.  Questions were answered to the patient's satisfaction.     Caryn Bee P Rosine Solecki

## 2022-07-06 NOTE — Anesthesia Preprocedure Evaluation (Addendum)
Anesthesia Evaluation  Patient identified by MRN, date of birth, ID band Patient awake    Reviewed: Allergy & Precautions, NPO status , Patient's Chart, lab work & pertinent test results  Airway Mallampati: II  TM Distance: >3 FB Neck ROM: Full    Dental no notable dental hx. (+) Dental Advisory Given, Teeth Intact   Pulmonary pneumonia, COPD, Current Smoker and Patient abstained from smoking.   Pulmonary exam normal breath sounds clear to auscultation       Cardiovascular hypertension, Normal cardiovascular exam+ Valvular Problems/Murmurs  Rhythm:Regular Rate:Normal  Echo 2022  1. Left ventricular ejection fraction, by estimation, is 60 to 65%. The left ventricle has normal function. The left ventricle has no regional wall motion abnormalities. Left ventricular diastolic parameters were normal.   2. Right ventricular systolic function is normal. The right ventricular size is normal.   3. The mitral valve is myxomatous. Mild to moderate mitral valve  regurgitation. No evidence of mitral stenosis. There is mild late systolic prolapse of both leaflets of the mitral valve.   4. The tricuspid valve is myxomatous. Tricuspid valve regurgitation is mild to moderate.   5. The aortic valve is normal in structure. Aortic valve regurgitation is not visualized. No aortic stenosis is present.   6. The inferior vena cava is normal in size with greater than 50% respiratory variability, suggesting right atrial pressure of 3 mmHg.      Neuro/Psych  PSYCHIATRIC DISORDERS Anxiety Depression    negative neurological ROS     GI/Hepatic Neg liver ROS, hiatal hernia, PUD,GERD  ,,  Endo/Other  Hypothyroidism    Renal/GU Renal disease     Musculoskeletal  (+)  Fibromyalgia -  Abdominal   Peds  Hematology  (+) Blood dyscrasia, anemia   Anesthesia Other Findings   Reproductive/Obstetrics                              Anesthesia Physical Anesthesia Plan  ASA: 3  Anesthesia Plan: General   Post-op Pain Management: Gabapentin PO (pre-op)* and Tylenol PO (pre-op)*   Induction: Intravenous and Rapid sequence  PONV Risk Score and Plan: 3 and Ondansetron, Dexamethasone, Treatment may vary due to age or medical condition and Midazolam  Airway Management Planned: Oral ETT  Additional Equipment:   Intra-op Plan:   Post-operative Plan: Extubation in OR  Informed Consent: I have reviewed the patients History and Physical, chart, labs and discussed the procedure including the risks, benefits and alternatives for the proposed anesthesia with the patient or authorized representative who has indicated his/her understanding and acceptance.     Dental advisory given  Plan Discussed with: CRNA  Anesthesia Plan Comments: (Offered spinal. Pt refused.)       Anesthesia Quick Evaluation

## 2022-07-06 NOTE — TOC Initial Note (Signed)
Transition of Care [facility]) - Initial/Assessment Note    Patient Details  Name: [name] MRN: [id number] Date of Birth: 04-Jul-1970  Transition of Care [facility]) CM/SW Contact:    Epifanio Lesches, RN Phone Number: 07/06/2022, 6:40 PM  Clinical Narrative:                    - s/p Cephalomedullary nailing of left intertrochanteric femur fracture,6/24  Presents after a fall @ church. Suffered L femur fx. From home with adult son. States PTA independent with ADL's, no DME usage.   Pt states son will assist with care if transitions to home. Agreeable to home health services if needed. Pt without provider preference as long as it is in network with insurance.  Pt without transportation issues or  RX med concerns.   PT/OT evaluations are pending.....    TOC team will continue to monitor and assist with need  Expected Discharge Plan: Home w Home Health Services Barriers to Discharge: Continued Medical Work up   Patient Goals and CMS Choice     Choice offered to / list presented to : Patient      Expected Discharge Plan and Services       Living arrangements for the past 2 months: Single Family Home                                      Prior Living Arrangements/Services Living arrangements for the past 2 months: Single Family Home Lives with:: Adult Children (son)   Do you feel safe going back to the place where you live?: Yes      Need for Family Participation in Patient Care: Yes (Comment) Care giver support system in place?: Yes (comment)   Criminal Activity/Legal Involvement Pertinent to Current Situation/Hospitalization: No - Comment as needed  Activities of Daily Living      Permission Sought/Granted                  Emotional Assessment   Attitude/Demeanor/Rapport: Engaged Affect (typically observed): Accepting Orientation: : Oriented to Self, Oriented to Place, Oriented to  Time, Oriented to Situation Alcohol / Substance Use: Tobacco  Use (PTA smoked cigs, 1/2 pk /day, not interested in stopping) Psych Involvement: No (comment)  Admission diagnosis:  Left hip pain [M25.552] Closed displaced intertrochanteric fracture of left femur (HCC) [S72.142A] Closed fracture of left hip, initial encounter (HCC) [S72.002A] Fall, initial encounter [W19.XXXA] Closed left femoral fracture (HCC) [S72.92XA] Patient Active Problem List   Diagnosis Date Noted   Closed left femoral fracture (HCC) 07/06/2022   Closed displaced intertrochanteric fracture of left femur (HCC) 07/05/2022   S/P small bowel resection 06/24/2021   Pulmonary fibrosis (HCC) 02/13/2021   COPD (chronic obstructive pulmonary disease) (HCC)    Hypothyroidism    Normochromic normocytic anemia 01/03/2021   Abdominal pain    Hypertension 08/19/2020   Protein-calorie malnutrition, severe 08/11/2019   Malnutrition (HCC) 08/09/2019   Nausea and vomiting    Regurgitation of food    Aperistalsis of esophagus    Anxiety 02/28/2019   Calcaneus fracture, right 06/19/2017   Chronic pain 06/19/2017   GERD (gastroesophageal reflux disease) 09/05/2015   IBS (irritable bowel syndrome) 09/05/2015   Depression 09/05/2015   Chronic interstitial cystitis 09/05/2015   Iron deficiency anemia 09/05/2015   PCP:  Lonie Peak, PA-C Pharmacy:   [facility] [phone] - [place], [place] - [address]  AT [facility] [address] [place] [address] Phone: [phone] Fax: [phone]     Social Determinants of Health (SDOH) Social History: SDOH Screenings   Depression (PHQ2-9): Low Risk  (04/30/2020)  Tobacco Use: High Risk (07/06/2022)   SDOH Interventions:     Readmission Risk Interventions     No data to display

## 2022-07-06 NOTE — Transfer of Care (Signed)
Immediate Anesthesia Transfer of Care Note  Patient: [name]  Procedure(s) Performed: LEFT INTRAMEDULLARY (IM) NAIL INTERTROCHANTERIC (Left: Hip)  Patient Location: PACU  Anesthesia Type:General  Level of Consciousness: awake, alert , and oriented  Airway & Oxygen Therapy: Patient Spontanous Breathing and Patient connected to nasal cannula oxygen  Post-op Assessment: Report given to RN and Post -op Vital signs reviewed and stable  Post vital signs: Reviewed and stable  Last Vitals:  Vitals Value Taken Time  BP    Temp    Pulse 86 07/06/22 1114  Resp 14 07/06/22 1114  SpO2 95 % 07/06/22 1114  Vitals shown include unvalidated device data.  Last Pain:  Vitals:   07/06/22 0852  TempSrc: Oral  PainSc:          Complications: No notable events documented.

## 2022-07-06 NOTE — Assessment & Plan Note (Signed)
Continue oxymetry monitoring and supplemental 02 per [place] to keep 02 saturation 88% or greater.

## 2022-07-06 NOTE — Plan of Care (Signed)
  Problem: Education: Goal: Knowledge of General Education information will improve Description: Including pain rating scale, medication(s)/side effects and non-pharmacologic comfort measures Outcome: Progressing   Problem: Clinical Measurements: Goal: Ability to maintain clinical measurements within normal limits will improve Outcome: Progressing Goal: Will remain free from infection Outcome: Progressing Goal: Diagnostic test results will improve Outcome: Progressing Goal: Respiratory complications will improve Outcome: Progressing Goal: Cardiovascular complication will be avoided Outcome: Progressing   Problem: Coping: Goal: Level of anxiety will decrease Outcome: Progressing   Problem: Pain Managment: Goal: General experience of comfort will improve Outcome: Progressing   Problem: Safety: Goal: Ability to remain free from injury will improve Outcome: Progressing   Problem: Health Behavior/Discharge Planning: Goal: Ability to manage health-related needs will improve Outcome: Not Progressing   Problem: Activity: Goal: Risk for activity intolerance will decrease Outcome: Not Progressing   Problem: Nutrition: Goal: Adequate nutrition will be maintained Outcome: Not Progressing   Problem: Elimination: Goal: Will not experience complications related to bowel motility Outcome: Not Progressing Goal: Will not experience complications related to urinary retention Outcome: Not Progressing   Problem: Skin Integrity: Goal: Risk for impaired skin integrity will decrease Outcome: Not Progressing

## 2022-07-06 NOTE — TOC CAGE-AID Note (Signed)
Transition of Care [facility]) - CAGE-AID Screening   Patient Details  Name: [name] MRN: [id number] Date of Birth: December 10, 1970  Transition of Care [facility]) CM/SW Contact:    Katha Hamming, RN Phone Number: 07/06/2022, 7:54 PM  CAGE-AID Screening:    Have You Ever Felt You Ought to Cut Down on Your Drinking or Drug Use?: No Have People Annoyed You By Critizing Your Drinking Or Drug Use?: No Have You Felt Bad Or Guilty About Your Drinking Or Drug Use?: No Have You Ever Had a Drink or Used Drugs First Thing In The Morning to Steady Your Nerves or to Get Rid of a Hangover?: No CAGE-AID Score: 0  Substance Abuse Education Offered: No

## 2022-07-06 NOTE — Assessment & Plan Note (Signed)
Continue to hold on antihypertensive medications  

## 2022-07-06 NOTE — Assessment & Plan Note (Addendum)
Depression.  Continue with quetiapine, paroxetine, and buspirone.   Chronic pain syndrome, continue with fentanyl and oxycodone.

## 2022-07-06 NOTE — Assessment & Plan Note (Addendum)
Continue antiacid therapy.Marland Kitchen   Sp gastrectomy, very poor oral intake and poor nutrition.  Severe calorie protein malnutrition.  Continue on dysphagia 3 diet. Poor prognosis, will consult palliative care.

## 2022-07-06 NOTE — Hospital Course (Signed)
[name] was admitted to the hospital with the working diagnosis of left hip fracture.   52 yo female with the past medical history of hypertension, hypothyroidism, COPD, pulmonary fibrosis, gastric ulcer with gastric outlet obstruction, sp partial gastric resection and jejunostomy, sp reversion. Patient lost her balance and fell down the stairs, landing on her left hip. Post trauma she had severe pain, not able to move. In the ED her blood pressure was 120/90, RR 17 and 02 saturation 100%, lungs with no wheezing or rales, heart with S1 and S2 present and rhythmic, abdomen with no distention and no lower extremity edema. \  Hip radiograph with mild displaced intertrochanteric fracture of the proximal left femur.  06/24 Cephalomedullary nailing of left intertrochanteric femur fracture

## 2022-07-06 NOTE — Progress Notes (Signed)
  Progress Note   Patient: [name]:[id number] DOB: March 02, 1970 DOA: 07/05/2022     0 DOS: the patient was seen and examined on 07/06/2022   Brief hospital course: [name] was admitted to the hospital with the working diagnosis of left hip fracture.   52 yo female with the past medical history of hypertension, hypothyroidism, COPD, pulmonary fibrosis, gastric ulcer with gastric outlet obstruction, sp partial gastric resection and jejunostomy, sp reversion. Patient lost her balance and fell down the stairs, landing on her left hip. Post trauma she had severe pain, not able to move. In the ED her blood pressure was 120/90, RR 17 and 02 saturation 100%, lungs with no wheezing or rales, heart with S1 and S2 present and rhythmic, abdomen with no distention and no lower extremity edema. \  Hip radiograph with mild displaced intertrochanteric fracture of the proximal left femur.  06/24 Cephalomedullary nailing of left intertrochanteric femur fracture     Assessment and Plan: * Closed displaced intertrochanteric fracture of left femur (HCC) Continue with post op care.  Pain control, DVT prophylaxis, and PT/OT Follow up with surgery recommendations.   COPD (chronic obstructive pulmonary disease) (HCC) No signs of acute exacerbation.  Hypertension Continue to hold on antihypertensive medications.   Anxiety Depression.  Continue with quetiapine and buspirone.   GERD (gastroesophageal reflux disease) Continue antiacid therapy.Marland Kitchen   Sp gastrectomy, very poor oral intake and poor nutrition.  Severe calorie protein malnutrition.  Continue on dysphagia 3 diet. Poor prognosis, will consult palliative care.   Iron deficiency anemia Follow up on iron panel as outpatient.   Pulmonary fibrosis (HCC) Continue oxymetry monitoring and supplemental 02 per [place] to keep 02 saturation 88% or greater.         Subjective: Patient with no chest pain or dyspnea, mild left hip pain, no nausea  or vomiting.   Physical Exam: Vitals:   07/06/22 1158 07/06/22 1212 07/06/22 1238 07/06/22 1238  BP: 93/64 (!) 86/61 96/63 96/63   Pulse: 73 75 78 77  Resp: 12 16 17 17   Temp: 97.9 F (36.6 C) 97.9 F (36.6 C) 98 F (36.7 C) 98 F (36.7 C)  TempSrc:  Oral Oral Oral  SpO2: 95% 96% 96% 96%  Weight:      Height:       Neurology awake and alert ENT with mild pallor Cardiovascular with S1 and S2 present and rhythmic with no gallops, rubs or murmurs Respiratory with no rales or wheezing Abdomen with no distention  No lower extremity edema  Data Reviewed:    Family Communication: no family at the bedside   Disposition: Status is: Inpatient Remains inpatient appropriate because: postop care  Planned Discharge Destination: Home   Author: Coralie Keens, MD 07/06/2022 4:37 PM  For on call review [email or web address].

## 2022-07-06 NOTE — Progress Notes (Signed)
Pt off the unit to OR.

## 2022-07-06 NOTE — Progress Notes (Signed)
Initial Nutrition Assessment  DOCUMENTATION CODES:   Severe malnutrition in context of chronic illness, Underweight  INTERVENTION:  Dysphagia 3 diet for ease of chewing Boost Breeze po TID, each supplement provides 250 kcal and 9 grams of protein Snacks TID between meals for smaller more frequent PO intake MVI with minerals daily Recommend discussion of nutrition goals of care given severe malnutrition and ongoing/chronic inability to tolerate adequate po intake.   NUTRITION DIAGNOSIS:   Severe Malnutrition related to chronic illness (GOO s/p partial gastric resection, COPD) as evidenced by severe fat depletion, severe muscle depletion.  GOAL:   Patient will meet greater than or equal to 90% of their needs  MONITOR:   PO intake, Supplement acceptance, Labs, Weight trends, Skin  REASON FOR ASSESSMENT:   Consult Assessment of nutrition requirement/status (Hx of Gastric ulcer, Hx of gastric outlet obstruction, Malnutrition, S/P partial gastric resection and jejunostomy, now status post jejunostomy takedown in 2023 and taking PO.)  ASSESSMENT:   Pt admitted after a fall leading to L intertrochanteric femur fracture. PMH significant for HTN, hypothyroidism, COPD, chronic pain, vertebral compression fracture, anemia, aspiration, pulmonary fibrosis, GERD, depression, anxiety, gastric ulcer and GOO s/p partial gastric resection and jejunostomy now s/p takedown.   6/24 - s/p cephalomedullary nailing of L intertrochanteric femur  Pt initially off unit but her dad was present awaiting her return from surgery. Pt returned during discussion with pt's dad however she was drowsy from OR therefore unable to obtain detailed nutrition related history at that time. Allowed pt to rest and returned for further nutrition assessment.   Her dad reports that for about the last 6 years, her weight has remained an issue. 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  Pt recalls previously having a G-J tube for nutrition however required frequent replacement d/t recurrent leakage. Given the cost of recurrent replacements and inability to gain weight, she had it removed in 2023. She had a NG tube placed but it slid out just after placement therefore decided to try to optimize po intake as best as able.   She recalls less frequent episodes of nausea since beginning CBD gummies a few months ago however remains with minimal po intake. She tries to consume small snacks throughout the day but can only tolerate small portions. D/t many years of emesis, her teeth are in poor condition and is agreeable to a chopped meat diet. She recalls having been followed by ST/GI outpatient as pt reports having poor esophageal motility but denies current difficulty with solids. Pt has difficulty with tolerance of milk products so declines Ensure/Boost but is agreeable to trying Boost Breeze.   Unfortunately there is limited documentation of weight history on file to review within the last year. Pt's weight was documented to be 33.6 kg in 2023 and is now 29 kg.   Medications: colace, protonix, IV abx, LR @ 10ml/hr  Labs: Vitamin D 12.23  NUTRITION - FOCUSED PHYSICAL EXAM:   Flowsheet Row Most Recent Value  Orbital Region Severe depletion  Upper Arm Region Severe depletion  Thoracic and Lumbar Region Severe depletion  Buccal Region Severe depletion  Temple Region Severe depletion  Clavicle Bone Region Severe depletion  Clavicle and Acromion Bone Region Severe depletion  Scapular Bone Region Severe depletion  Dorsal Hand Severe depletion  Patellar Region Severe depletion  Anterior Thigh Region Severe depletion  Posterior Calf Region Severe depletion  Edema (RD Assessment) None  Hair Reviewed  Eyes Reviewed  Mouth Other (Comment)  [poor dentition]  Skin Reviewed  Nails  Reviewed       Diet Order:   Diet Order             DIET DYS 3 Room service appropriate? Yes; Fluid consistency: Thin  Diet effective now                   EDUCATION NEEDS:   Education needs have been addressed  Skin:  Skin Assessment: Reviewed RN Assessment (L hip closed incision)  Last BM:  PTA  Height:   Ht Readings from Last 1 Encounters:  07/06/22 4\' 11"  (1.499 m)    Weight:   Wt Readings from Last 1 Encounters:  07/06/22 29 kg    Ideal Body Weight:  44.7 kg  BMI:  Body mass index is 12.91 kg/m.  Estimated Nutritional Needs:   Kcal:  1200-1400  Protein:  60-75g  Fluid:  1.2-1.4L  Drusilla Kanner, RDN, LDN Clinical Nutrition

## 2022-07-06 NOTE — Anesthesia Preprocedure Evaluation (Signed)
Anesthesia Evaluation    Reviewed: Allergy & Precautions, Patient's Chart, lab work & pertinent test results, Unable to perform ROS - Chart review only  Airway        Dental   Pulmonary pneumonia, COPD, Current Smoker and Patient abstained from smoking.          Cardiovascular hypertension, + Valvular Problems/Murmurs   Echo 2022  1. Left ventricular ejection fraction, by estimation, is 60 to 65%. The left ventricle has normal function. The left ventricle has no regional wall motion abnormalities. Left ventricular diastolic parameters were normal.   2. Right ventricular systolic function is normal. The right ventricular size is normal.   3. The mitral valve is myxomatous. Mild to moderate mitral valve  regurgitation. No evidence of mitral stenosis. There is mild late systolic prolapse of both leaflets of the mitral valve.   4. The tricuspid valve is myxomatous. Tricuspid valve regurgitation is mild to moderate.   5. The aortic valve is normal in structure. Aortic valve regurgitation is not visualized. No aortic stenosis is present.   6. The inferior vena cava is normal in size with greater than 50% respiratory variability, suggesting right atrial pressure of 3 mmHg.      Neuro/Psych  PSYCHIATRIC DISORDERS Anxiety Depression    negative neurological ROS     GI/Hepatic Neg liver ROS, hiatal hernia, PUD,GERD  ,,  Endo/Other  Hypothyroidism    Renal/GU Renal disease     Musculoskeletal  (+)  Fibromyalgia -  Abdominal   Peds  Hematology  (+) Blood dyscrasia, anemia   Anesthesia Other Findings   Reproductive/Obstetrics                             Anesthesia Physical Anesthesia Plan  ASA: 3  Anesthesia Plan: Regional   Post-op Pain Management:    Induction:   PONV Risk Score and Plan:   Airway Management Planned:   Additional Equipment:   Intra-op Plan:   Post-operative Plan:    Informed Consent: I have reviewed the patients History and Physical, chart, labs and discussed the procedure including the risks, benefits and alternatives for the proposed anesthesia with the patient or authorized representative who has indicated his/her understanding and acceptance.       Plan Discussed with:   Anesthesia Plan Comments:        Anesthesia Quick Evaluation

## 2022-07-06 NOTE — Assessment & Plan Note (Addendum)
Continue with post op care.  Pain control, DVT prophylaxis, and PT/OT Plan for dressing changes tomorrow. Possible discharge home tomorrow.

## 2022-07-07 ENCOUNTER — Encounter (HOSPITAL_COMMUNITY): Payer: Self-pay | Admitting: Student

## 2022-07-07 DIAGNOSIS — K2100 Gastro-esophageal reflux disease with esophagitis, without bleeding: Secondary | ICD-10-CM | POA: Diagnosis not present

## 2022-07-07 DIAGNOSIS — F419 Anxiety disorder, unspecified: Secondary | ICD-10-CM | POA: Diagnosis not present

## 2022-07-07 DIAGNOSIS — S72142A Displaced intertrochanteric fracture of left femur, initial encounter for closed fracture: Secondary | ICD-10-CM | POA: Diagnosis not present

## 2022-07-07 DIAGNOSIS — J449 Chronic obstructive pulmonary disease, unspecified: Secondary | ICD-10-CM | POA: Diagnosis not present

## 2022-07-07 DIAGNOSIS — I959 Hypotension, unspecified: Secondary | ICD-10-CM

## 2022-07-07 LAB — BASIC METABOLIC PANEL
Anion gap: 7 (ref 5–15)
BUN: 22 mg/dL — ABNORMAL HIGH (ref 6–20)
CO2: 25 mmol/L (ref 22–32)
Calcium: 8.1 mg/dL — ABNORMAL LOW (ref 8.9–10.3)
Chloride: 102 mmol/L (ref 98–111)
Creatinine, Ser: 1.04 mg/dL — ABNORMAL HIGH (ref 0.44–1.00)
GFR, Estimated: >60 mL/min (ref >60)
Glucose, Bld: 99 mg/dL (ref 70–99)
Potassium: 4.3 mmol/L (ref 3.5–5.1)
Sodium: 134 mmol/L — ABNORMAL LOW (ref 135–145)

## 2022-07-07 LAB — CBC
HCT: 25.5 % — ABNORMAL LOW (ref 36.0–46.0)
Hemoglobin: 7.4 g/dL — ABNORMAL LOW (ref 12.0–15.0)
MCH: 21.8 pg — ABNORMAL LOW (ref 26.0–34.0)
MCHC: 29 g/dL — ABNORMAL LOW (ref 30.0–36.0)
MCV: 75.2 fL — ABNORMAL LOW (ref 80.0–100.0)
Platelets: 222 10*3/uL (ref 150–400)
RBC: 3.39 MIL/uL — ABNORMAL LOW (ref 3.87–5.11)
RDW: 16.5 % — ABNORMAL HIGH (ref 11.5–15.5)
WBC: 12.1 10*3/uL — ABNORMAL HIGH (ref 4.0–10.5)
nRBC: 0 % (ref 0.0–0.2)

## 2022-07-07 MED ORDER — SODIUM CHLORIDE 0.9 % IV BOLUS
250.0000 mL | Freq: Once | INTRAVENOUS | Status: AC
  Administered 2022-07-07: 250 mL via INTRAVENOUS

## 2022-07-07 MED ORDER — VITAMIN D (ERGOCALCIFEROL) 1.25 MG (50000 UNIT) PO CAPS
50000.0000 [IU] | ORAL_CAPSULE | ORAL | Status: DC
  Administered 2022-07-07: 50000 [IU] via ORAL
  Filled 2022-07-07: qty 1

## 2022-07-07 NOTE — Progress Notes (Addendum)
  Progress Note   Patient: [name]:[id number] DOB: [birth year] DOA: 07/05/2022     1 DOS: the patient was seen and examined on 07/07/2022   Brief hospital course: [name] was admitted to the hospital with the working diagnosis of left hip fracture.   52 yo female with the past medical history of hypertension, hypothyroidism, COPD, pulmonary fibrosis, gastric ulcer with gastric outlet obstruction, sp partial gastric resection and jejunostomy, sp reversion. Patient lost her balance and fell down the stairs, landing on her left hip. Post trauma she had severe pain, not able to move. In the ED her blood pressure was 120/90, RR 17 and 02 saturation 100%, lungs with no wheezing or rales, heart with S1 and S2 present and rhythmic, abdomen with no distention and no lower extremity edema. \  Hip radiograph with mild displaced intertrochanteric fracture of the proximal left femur.  06/24 Cephalomedullary nailing of left intertrochanteric femur fracture     Assessment and Plan: * Closed displaced intertrochanteric fracture of left femur (HCC) Continue with post op care.  Pain control, DVT prophylaxis, and PT/OT Plan for dressing changes tomorrow. Possible discharge home tomorrow.   Hypotension Chronic hypotension, keep MAP 65 or greater.    COPD (chronic obstructive pulmonary disease) (HCC) No signs of acute exacerbation.  Hypertension-resolved as of 07/07/2022 Continue to hold on antihypertensive medications.   GERD (gastroesophageal reflux disease) Continue antiacid therapy.Marland Kitchen   Sp gastrectomy, very poor oral intake and poor nutrition.  Severe calorie protein malnutrition.  Continue on dysphagia 3 diet. Poor prognosis, will consult palliative care.   Anxiety Depression.  Continue with quetiapine, paroxetine, and buspirone.   Chronic pain syndrome, continue with fentanyl and oxycodone.   Iron deficiency anemia Follow up on iron panel as outpatient.   Pulmonary  fibrosis (HCC) Continue oxymetry monitoring and supplemental 02 per [place] to keep 02 saturation 88% or greater.         Subjective: patient is feeling better, her post operative pain is controlled with analgesics, no nausea or vomiting.   Physical Exam: Vitals:   07/07/22 0409 07/07/22 0739 07/07/22 0800 07/07/22 1424  BP: 90/66 (!) 85/59 104/74 91/65  Pulse: 69 76 79 81  Resp: 13 12 19 20   Temp: 98 F (36.7 C) 98.2 F (36.8 C)  98.3 F (36.8 C)  TempSrc: Oral Oral  Oral  SpO2: 98% 98% 99% 98%  Weight:      Height:       Neurology awake and alert ENT with mild pallor Cardiovascular with S1 and S2 present and rhythmic, with no gallops, rubs or murmurs Respiratory with no rales or wheezing, no rhonchi Abdomen with no distention  Data Reviewed:    Family Communication: no family at the bedside   Disposition: Status is: Inpatient Remains inpatient appropriate because: post op care, possible discharge tomorrow   Planned Discharge Destination: Home      Author: Coralie Keens, MD 07/07/2022 4:14 PM  For on call review [email or web address].

## 2022-07-07 NOTE — Progress Notes (Addendum)
Orthopaedic Trauma Progress Note  SUBJECTIVE: Doing okay this morning, notes moderate pain in the left hip.  Recently requested next dose of medication, waiting for this.  Denies any numbness or tingling throughout the left lower extremity.  Has not been up out of bed yet since surgery.  No chest pain. No SOB. No nausea/vomiting. No other complaints.  Waiting on her breakfast this morning.  Tolerating fluids.  Patient eager to go home. Creatinine elevated this morning.  OBJECTIVE:  Vitals:   07/07/22 0409 07/07/22 0739  BP: 90/66 (!) 85/59  Pulse: 69 76  Resp: 13 12  Temp: 98 F (36.7 C) 98.2 F (36.8 C)  SpO2: 98% 98%    General: Sitting up in bed, no acute distress Respiratory: No increased work of breathing.  Left lower extremity: Dressings clean, dry, intact.  Tenderness over the hip and proximal thigh as expected.  Less tender through the distal thigh and lower leg.  No calf tenderness.  Compartment soft compressible.  Ankle DF/PF is intact.  Able to wiggle toes.  Endorses sensation to light touch throughout distal extremity. + DP pulse  IMAGING: Stable post op imaging.   LABS:  Results for orders placed or performed during the hospital encounter of 07/05/22 (from the past 24 hour(s))  Surgical pcr screen     Status: None   Collection Time: 07/06/22  9:09 AM   Specimen: Nasal Mucosa; Nasal Swab  Result Value Ref Range   MRSA, PCR NEGATIVE NEGATIVE   Staphylococcus aureus NEGATIVE NEGATIVE  VITAMIN D 25 Hydroxy (Vit-D Deficiency, Fractures)     Status: Abnormal   Collection Time: 07/06/22  1:33 PM  Result Value Ref Range   Vit D, 25-Hydroxy 12.23 (L) 30 - 100 ng/mL  CBC     Status: Abnormal   Collection Time: 07/06/22  1:33 PM  Result Value Ref Range   WBC 14.0 (H) 4.0 - 10.5 K/uL   RBC 4.24 3.87 - 5.11 MIL/uL   Hemoglobin 9.3 (L) 12.0 - 15.0 g/dL   HCT 16.1 (L) 09.6 - 04.5 %   MCV 78.1 (L) 80.0 - 100.0 fL   MCH 21.9 (L) 26.0 - 34.0 pg   MCHC 28.1 (L) 30.0 - 36.0 g/dL    RDW 40.9 (H) 81.1 - 15.5 %   Platelets 230 150 - 400 K/uL   nRBC 0.0 0.0 - 0.2 %  Creatinine, serum     Status: None   Collection Time: 07/06/22  1:33 PM  Result Value Ref Range   Creatinine, Ser 0.74 0.44 - 1.00 mg/dL   GFR, Estimated >91 >47 mL/min  CBC     Status: Abnormal   Collection Time: 07/07/22  1:05 AM  Result Value Ref Range   WBC 12.1 (H) 4.0 - 10.5 K/uL   RBC 3.39 (L) 3.87 - 5.11 MIL/uL   Hemoglobin 7.4 (L) 12.0 - 15.0 g/dL   HCT 82.9 (L) 56.2 - 13.0 %   MCV 75.2 (L) 80.0 - 100.0 fL   MCH 21.8 (L) 26.0 - 34.0 pg   MCHC 29.0 (L) 30.0 - 36.0 g/dL   RDW 86.5 (H) 78.4 - 69.6 %   Platelets 222 150 - 400 K/uL   nRBC 0.0 0.0 - 0.2 %  Basic metabolic panel     Status: Abnormal   Collection Time: 07/07/22  1:05 AM  Result Value Ref Range   Sodium 134 (L) 135 - 145 mmol/L   Potassium 4.3 3.5 - 5.1 mmol/L   Chloride 102 98 -  111 mmol/L   CO2 25 22 - 32 mmol/L   Glucose, Bld 99 70 - 99 mg/dL   BUN 22 (H) 6 - 20 mg/dL   Creatinine, Ser 4.25 (H) 0.44 - 1.00 mg/dL   Calcium 8.1 (L) 8.9 - 10.3 mg/dL   GFR, Estimated >95 >63 mL/min   Anion gap 7 5 - 15    ASSESSMENT: [name] is a 52 y.o. female, 1 Day Post-Op s/p INTRAMEDULLARY NAIL LEFT INTERTROCHANTERIC FEMUR FRACTURE  CV/Blood loss: Acute blood loss anemia, Hgb 7.4 this morning. Hemodynamically stable  PLAN: Weightbearing: WBAT LLE ROM: Okay for unrestricted ROM Incisional and dressing care: Reinforce dressings as needed  Showering: Okay to being getting incisions wet 07/09/2022 Orthopedic device(s): None  Pain management:  1. Tylenol 325-650 mg q 6 hours PRN 2. Flexeril 5 mg 3 times daily PRN 3. Oxycodone 5 mg q 6 hours PRN 4. Dilaudid 1 mg q 3 hours PRN 5. Toradol 30 mg q 6 hours PRN 6. Gabapentin 800 mg TID VTE prophylaxis: Lovenox, SCDs ID:  Ancef 2gm post op Foley/Lines:  No foley, KVO IVFs Impediments to Fracture Healing: Vitamin D level 12, started on supplementation Dispo: PT/OT evaluation  today.  Patient hopeful to return home.  Continue to monitor CBC. Recheck BMET tomorrow. Plan to remove dressings LLE tomorrow 07/08/2022   D/C recommendations: -Oxycodone and Flexeril for pain control -Aspirin 81 mg daily x 30 days for DVT prophylaxis -Continue 50,000 units Vit D supplementation weekly x 8 weeks  Follow - up plan: 2 weeks after discharge for wound check and repeat x-rays   Contact information:  Truitt Merle MD, Thyra Breed PA-C. After hours and holidays please check [email or web address] for group call information for [facility]   Thompson Caul, PA-C [phone] (office) [email or web address]

## 2022-07-07 NOTE — Assessment & Plan Note (Signed)
Chronic hypotension, keep MAP 65 or greater.

## 2022-07-07 NOTE — Evaluation (Signed)
Physical Therapy Evaluation Patient Details Name: [name] MRN: [id number] DOB: 1970/08/08 Today's Date: 07/07/2022  History of Present Illness  52 y.o. female who presents with fall down steps and immediate hip pain. Brought ED 6/23 where x-rays showed a left hip fx. S/p 6/24 cephalomedullary nailing of her left hip.  PMH: hypertension, hypothyroidism, COPD, chronic pain, vertebral compression fractures, anemia, aspiration, pulmonary fibrosis, GERD, depression, anxiety, OCD, gastric ulcer and gastric outlet obstruction status post partial gastric resection  Clinical Impression  PTA pt living with 17 year old son in single story home with steps to enter. Pt was completely independent, working part time as a Conservation officer, nature at [employer]. Pt is currently limited in safe mobility by pain with L LE weightbearing. Pt is currently min A  for bed mobility and transfers. PT will work with pt on ambulation and stairs prior to discharge home. Pt will benefit from post acute rehab to improve mobility and strength.      Recommendations for follow up therapy are one component of a multi-disciplinary discharge planning process, led by the attending physician.  Recommendations may be updated based on patient status, additional functional criteria and insurance authorization.     Assistance Recommended at Discharge Intermittent Supervision/Assistance  Patient can return home with the following  A little help with walking and/or transfers;A little help with bathing/dressing/bathroom;Assistance with cooking/housework;Assist for transportation;Help with stairs or ramp for entrance    Equipment Recommendations Rolling walker (2 wheels);BSC/3in1     Functional Status Assessment Patient has had a recent decline in their functional status and demonstrates the ability to make significant improvements in function in a reasonable and predictable amount of time.     Precautions / Restrictions Precautions Precautions:  Fall Restrictions Weight Bearing Restrictions: Yes LLE Weight Bearing: Weight bearing as tolerated      Mobility  Bed Mobility Overal bed mobility: Needs Assistance Bed Mobility: Supine to Sit     Supine to sit: Min assist     General bed mobility comments: vc for sequencing, utilizing R LE to help bridge hips to reduce friction to move L LE to floor, min A for pad scoot of her hips to square with bed    Transfers Overall transfer level: Needs assistance Equipment used: None, Rolling walker (2 wheels) Transfers: Sit to/from Stand, Bed to chair/wheelchair/BSC Sit to Stand: Supervision   Step pivot transfers: Min assist       General transfer comment: pt able come to standing without assist stabilizing on bedrail with supervision, requires min A for management of walker due to it being too tall for her, to step and pivot to recliner    Ambulation/Gait               General Gait Details: deferred due to pain, Rn present with pain medication and breakfast arrival, edcuated pt on need for mobility and will refer to Mobility specialist to progress ambulation      Balance Overall balance assessment: Needs assistance Sitting-balance support: Feet supported, No upper extremity supported, Feet unsupported, Single extremity supported, Bilateral upper extremity supported Sitting balance-Leahy Scale: Fair     Standing balance support: Single extremity supported, During functional activity, Bilateral upper extremity supported Standing balance-Leahy Scale: Poor Standing balance comment: requires at least single UE support to offset L LE weightbearing to reduce pain                             Pertinent  Vitals/Pain Pain Assessment Pain Assessment: 0-10 Pain Score: 5  Pain Location: L hip operative site Pain Descriptors / Indicators: Constant, Throbbing, Sore Pain Intervention(s): Limited activity within patient's tolerance, Monitored during session,  Repositioned    Home Living Family/patient expects to be discharged to:: Private residence Living Arrangements: Children Available Help at Discharge: Family (son works during day) Type of Home: House Home Access: Stairs to enter   Secretary/administrator of Steps: 7-8   Home Layout: One level Home Equipment: Shower seat;Hand held shower head      Prior Function Prior Level of Function : Independent/Modified Independent;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[employer])                        Extremity/Trunk Assessment   Upper Extremity Assessment Upper Extremity Assessment: Defer to OT evaluation    Lower Extremity Assessment Lower Extremity Assessment: LLE deficits/detail LLE Deficits / Details: ROM and strength limited by pain LLE: Unable to fully assess due to pain LLE Sensation: WNL LLE Coordination: decreased fine motor    Cervical / Trunk Assessment Cervical / Trunk Assessment: Kyphotic  Communication   Communication: No difficulties  Cognition Arousal/Alertness: Awake/alert Behavior During Therapy: WFL for tasks assessed/performed Overall Cognitive Status: Within Functional Limits for tasks assessed                                          General Comments General comments (skin integrity, edema, etc.): supine BP 96/61, after mobility BP 107/76        Assessment/Plan    PT Assessment Patient needs continued PT services  PT Problem List Decreased strength;Decreased range of motion;Decreased activity tolerance;Decreased balance;Decreased coordination;Decreased mobility;Pain       PT Treatment Interventions DME instruction;Gait training;Stair training;Functional mobility training;Therapeutic activities;Therapeutic exercise;Balance training;Cognitive remediation;Patient/family education    PT Goals (Current goals can be found in the Care Plan section)  Acute Rehab PT Goals Patient Stated Goal: go home PT Goal Formulation: With patient Time  For Goal Achievement: 07/21/22 Potential to Achieve Goals: Fair    Frequency Min 5X/week        AM-PAC PT "6 Clicks" Mobility  Outcome Measure Help needed turning from your back to your side while in a flat bed without using bedrails?: A Little Help needed moving from lying on your back to sitting on the side of a flat bed without using bedrails?: A Little Help needed moving to and from a bed to a chair (including a wheelchair)?: A Little Help needed standing up from a chair using your arms (e.g., wheelchair or bedside chair)?: A Little Help needed to walk in hospital room?: A Little Help needed climbing 3-5 steps with a railing? : Total 6 Click Score: 16    End of Session   Activity Tolerance: Patient limited by pain Patient left: in chair;with chair alarm set;with call bell/phone within reach;with nursing/sitter in room Nurse Communication: Mobility status PT Visit Diagnosis: Unsteadiness on feet (R26.81);Other abnormalities of gait and mobility (R26.89);History of falling (Z91.81);Difficulty in walking, not elsewhere classified (R26.2);Pain Pain - Right/Left: Left Pain - part of body: Leg    Time: 6440-3474 PT Time Calculation (min) (ACUTE ONLY): 23 min   Charges:   PT Evaluation $PT Eval Moderate Complexity: 1 Mod PT Treatments $Therapeutic Activity: 8-22 mins        Teretha Chalupa B. Beverely Risen PT, DPT Acute Rehabilitation Services Please  use secure chat or  Call Office [phone]   Elon Alas [facility] 07/07/2022, 12:46 PM

## 2022-07-07 NOTE — Evaluation (Signed)
Occupational Therapy Evaluation Patient Details Name: [name] MRN: [id number] DOB: 1970/02/17 Today's Date: 07/07/2022   History of Present Illness 52 y.o. female who presents with fall down steps and immediate hip pain. Brought ED 6/23 where x-rays showed a left hip fx. S/p 6/24 cephalomedullary nailing of her left hip.  PMH: hypertension, hypothyroidism, COPD, chronic pain, vertebral compression fractures, anemia, aspiration, pulmonary fibrosis, GERD, depression, anxiety, OCD, gastric ulcer and gastric outlet obstruction status post partial gastric resection   Clinical Impression   Pt admitted for above dx, PTA pt was independent in ADLs and mobility, worked as a Conservation officer, nature at [employer]. Pt currently presenting with LLE pain which impacts balance and ability to independently complete bADLs. Pt needing Mod to Max A for lower body ADLs and Min A for bed mobility. Educated pt on the use and benefits of incentive spirometer, pt verbalized understanding. Pt would benefit from continued acute skilled OT services to address above deficits and help transition to next level of care. Patient would benefit from post acute Home OT services to help maximize functional independence in natural environment       Recommendations for follow up therapy are one component of a multi-disciplinary discharge planning process, led by the attending physician.  Recommendations may be updated based on patient status, additional functional criteria and insurance authorization.   Assistance Recommended at Discharge Frequent or constant Supervision/Assistance  Patient can return home with the following A little help with walking and/or transfers;A lot of help with bathing/dressing/bathroom;Assistance with cooking/housework;Assist for transportation;Help with stairs or ramp for entrance    Functional Status Assessment  Patient has had a recent decline in their functional status and demonstrates the ability to make  significant improvements in function in a reasonable and predictable amount of time.  Equipment Recommendations  Other (comment) (may need tub bench pending progression, plan to trial tub t/f next session)    Recommendations for Other Services       Precautions / Restrictions Precautions Precautions: Fall Restrictions Weight Bearing Restrictions: Yes LLE Weight Bearing: Weight bearing as tolerated      Mobility Bed Mobility Overal bed mobility: Needs Assistance Bed Mobility: Supine to Sit, Sit to Supine     Supine to sit: Min assist Sit to supine: Min assist   General bed mobility comments: Pt needing min A for LLE control/clearance during bed mobility    Transfers Overall transfer level: Needs assistance Equipment used: Rolling walker (2 wheels) Transfers: Sit to/from Stand Sit to Stand: Supervision           General transfer comment: cues for hand placement with RW use to reduce weight on LLE      Balance Overall balance assessment: Needs assistance Sitting-balance support: Feet supported, No upper extremity supported, Feet unsupported, Single extremity supported, Bilateral upper extremity supported Sitting balance-Leahy Scale: Fair     Standing balance support: Single extremity supported, During functional activity, Bilateral upper extremity supported Standing balance-Leahy Scale: Poor                             ADL either performed or assessed with clinical judgement   ADL Overall ADL's : Needs assistance/impaired Eating/Feeding: Sitting;Independent   Grooming: Wash/dry hands;Oral care;Sitting;Set up;Supervision/safety   Upper Body Bathing: Sitting;Modified independent   Lower Body Bathing: Sitting/lateral leans;Moderate assistance   Upper Body Dressing : Modified independent;Sitting   Lower Body Dressing: Sitting/lateral leans;Maximal assistance Lower Body Dressing Details (indicate cue type and  reason): Max A to don L sock, able to do  figure four min guard to don R sock Toilet Transfer: Min guard;Rolling walker (2 wheels)   Toileting- Clothing Manipulation and Hygiene: Sit to/from stand;Moderate assistance   Tub/ Shower Transfer: Tub transfer   Functional mobility during ADLs: Min guard;Rolling walker (2 wheels) General ADL Comments: OOB limited by pain, performed weight shifts at bedside L<>R. Also encourage IS use with instructions on device usage. Pt currently at     Vision         Perception     Praxis      Pertinent Vitals/Pain Pain Assessment Pain Assessment: 0-10 Pain Score: 6  Pain Location: L hip operative site Pain Descriptors / Indicators: Constant, Throbbing, Sore, Grimacing Pain Intervention(s): Repositioned, Monitored during session     Hand Dominance     Extremity/Trunk Assessment Upper Extremity Assessment Upper Extremity Assessment: Overall WFL for tasks assessed   Lower Extremity Assessment Lower Extremity Assessment: LLE deficits/detail LLE Deficits / Details: ROM and strength limited by pain LLE: Unable to fully assess due to pain LLE Sensation: WNL LLE Coordination: decreased fine motor   Cervical / Trunk Assessment Cervical / Trunk Assessment: Kyphotic   Communication Communication Communication: No difficulties   Cognition Arousal/Alertness: Awake/alert Behavior During Therapy: WFL for tasks assessed/performed Overall Cognitive Status: Within Functional Limits for tasks assessed                                       General Comments  Increased pain with mobility    Exercises     Shoulder Instructions      Home Living Family/patient expects to be discharged to:: Private residence Living Arrangements: Children Available Help at Discharge: Family (son works during day) Type of Home: House Home Access: Stairs to enter Secretary/administrator of Steps: 7-8   Home Layout: One level     Bathroom Shower/Tub: Contractor: Standard Bathroom Accessibility: Yes   Home Equipment: Shower seat;Hand held shower head          Prior Functioning/Environment Prior Level of Function : Independent/Modified Independent;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[employer])             Mobility Comments: ind ADLs Comments: ind        OT Problem List: Impaired balance (sitting and/or standing);Pain      OT Treatment/Interventions: Self-care/ADL training;Therapeutic activities;Therapeutic exercise;DME and/or AE instruction;Patient/family education;Balance training    OT Goals(Current goals can be found in the care plan section) Acute Rehab OT Goals Patient Stated Goal: To go home OT Goal Formulation: With patient Time For Goal Achievement: 07/21/22 Potential to Achieve Goals: Good ADL Goals Pt Will Perform Lower Body Dressing: with supervision;with adaptive equipment;sitting/lateral leans Pt Will Transfer to Toilet: with supervision;ambulating Pt Will Perform Tub/Shower Transfer: with supervision;Tub transfer;tub bench  OT Frequency: Min 3X/week    Co-evaluation              AM-PAC OT "6 Clicks" Daily Activity     Outcome Measure Help from another person eating meals?: None Help from another person taking care of personal grooming?: A Little Help from another person toileting, which includes using toliet, bedpan, or urinal?: A Lot Help from another person bathing (including washing, rinsing, drying)?: A Lot Help from another person to put on and taking off regular upper body clothing?: None Help from another person to put on  and taking off regular lower body clothing?: A Lot 6 Click Score: 17   End of Session Equipment Utilized During Treatment: Gait belt;Rolling walker (2 wheels) Nurse Communication: Mobility status  Activity Tolerance: Patient tolerated treatment well Patient left: in bed;with call bell/phone within reach  OT Visit Diagnosis: Other abnormalities of gait and mobility  (R26.89);Unsteadiness on feet (R26.81);Pain;History of falling (Z91.81) Pain - Right/Left: Left Pain - part of body: Leg                Time: 1203-1221 OT Time Calculation (min): 18 min Charges:  OT General Charges $OT Visit: 1 Visit OT Evaluation $OT Eval Moderate Complexity: 1 Mod  07/07/2022  AB, OTR/L  Acute Rehabilitation Services  Office: [phone]   Tristan Schroeder 07/07/2022, 2:04 PM

## 2022-07-07 NOTE — Progress Notes (Signed)
Pt. Only void since removal of foley. Last void post bladder scan 0mL. Md notified see Mar for new orders

## 2022-07-07 NOTE — Plan of Care (Signed)

## 2022-07-07 NOTE — Anesthesia Postprocedure Evaluation (Signed)
Anesthesia Post Note  Patient: [name]  Procedure(s) Performed: LEFT INTRAMEDULLARY (IM) NAIL INTERTROCHANTERIC (Left: Hip)     Patient location during evaluation: PACU Anesthesia Type: General Level of consciousness: sedated and patient cooperative Pain management: pain level controlled Vital Signs Assessment: post-procedure vital signs reviewed and stable Respiratory status: spontaneous breathing Cardiovascular status: stable Anesthetic complications: no   No notable events documented.  Last Vitals:  Vitals:   07/06/22 2009 07/07/22 0409  BP: 103/69 90/66  Pulse:  69  Resp: 17 13  Temp: 36.7 C 36.7 C  SpO2: 98% 98%    Last Pain:  Vitals:   07/07/22 0409  TempSrc: Oral  PainSc:                  [name]

## 2022-07-08 ENCOUNTER — Encounter (HOSPITAL_COMMUNITY): Payer: Self-pay | Admitting: Internal Medicine

## 2022-07-08 DIAGNOSIS — S72142A Displaced intertrochanteric fracture of left femur, initial encounter for closed fracture: Secondary | ICD-10-CM | POA: Diagnosis not present

## 2022-07-08 DIAGNOSIS — Z515 Encounter for palliative care: Secondary | ICD-10-CM | POA: Diagnosis not present

## 2022-07-08 DIAGNOSIS — Z7189 Other specified counseling: Secondary | ICD-10-CM | POA: Diagnosis not present

## 2022-07-08 DIAGNOSIS — S72102A Unspecified trochanteric fracture of left femur, initial encounter for closed fracture: Secondary | ICD-10-CM

## 2022-07-08 LAB — CBC
HCT: 22.2 % — ABNORMAL LOW (ref 36.0–46.0)
Hemoglobin: 6.3 g/dL — CL (ref 12.0–15.0)
MCH: 21.9 pg — ABNORMAL LOW (ref 26.0–34.0)
MCHC: 28.4 g/dL — ABNORMAL LOW (ref 30.0–36.0)
MCV: 77.1 fL — ABNORMAL LOW (ref 80.0–100.0)
Platelets: 190 10*3/uL (ref 150–400)
RBC: 2.88 MIL/uL — ABNORMAL LOW (ref 3.87–5.11)
RDW: 16.6 % — ABNORMAL HIGH (ref 11.5–15.5)
WBC: 7.9 10*3/uL (ref 4.0–10.5)
nRBC: 0 % (ref 0.0–0.2)

## 2022-07-08 LAB — BASIC METABOLIC PANEL
Anion gap: 5 (ref 5–15)
BUN: 17 mg/dL (ref 6–20)
CO2: 23 mmol/L (ref 22–32)
Calcium: 7.6 mg/dL — ABNORMAL LOW (ref 8.9–10.3)
Chloride: 108 mmol/L (ref 98–111)
Creatinine, Ser: 0.86 mg/dL (ref 0.44–1.00)
GFR, Estimated: >60 mL/min (ref >60)
Glucose, Bld: 95 mg/dL (ref 70–99)
Potassium: 4 mmol/L (ref 3.5–5.1)
Sodium: 136 mmol/L (ref 135–145)

## 2022-07-08 LAB — PREPARE RBC (CROSSMATCH)

## 2022-07-08 LAB — BPAM RBC
Blood Product Expiration Date: 202407242359
Unit Type and Rh: 5100

## 2022-07-08 LAB — HEMOGLOBIN AND HEMATOCRIT, BLOOD
HCT: 32.1 % — ABNORMAL LOW (ref 36.0–46.0)
Hemoglobin: 10 g/dL — ABNORMAL LOW (ref 12.0–15.0)

## 2022-07-08 MED ORDER — OXYCODONE HCL 5 MG PO TABS: 5.0000 mg | ORAL_TABLET | Freq: Four times a day (QID) | ORAL | 0 refills | Status: AC | PRN

## 2022-07-08 MED ORDER — ENOXAPARIN SODIUM 300 MG/3ML IJ SOLN: 15.0000 mg | INTRAMUSCULAR | Status: DC

## 2022-07-08 MED ORDER — SODIUM CHLORIDE 0.9% IV SOLUTION: Freq: Once | INTRAVENOUS | Status: AC

## 2022-07-08 MED ORDER — ASPIRIN 81 MG PO TBEC: 81.0000 mg | DELAYED_RELEASE_TABLET | Freq: Every day | ORAL | 0 refills | Status: AC

## 2022-07-08 MED ORDER — VITAMIN D (ERGOCALCIFEROL) 1.25 MG (50000 UNIT) PO CAPS: 50000.0000 [IU] | ORAL_CAPSULE | ORAL | 0 refills | Status: AC

## 2022-07-08 NOTE — TOC Transition Note (Signed)
Transition of Care [facility]) - CM/SW Discharge Note   Patient Details  Name: [name] MRN: [id number] Date of Birth: Feb 09, 1970  Transition of [facility] CM/SW Contact:  Epifanio Lesches, RN Phone Number: 07/08/2022, 3:41 PM   Clinical Narrative:    Patient will DC to: home Anticipated DC date: 07/08/2022 Family notified: yes Transport by: car        - s/p Cephalomedullary nailing of left intertrochanteric femur fracture,6/24   Per MD patient ready for DC today. RN, patient, and patient's family notified of DC. Pt agreeable to home services per PT's recommendations. Pt without provider preference. Referral made with [facility] and accepted. DME: RW, referral made with [facility] and will be delivered to bedside prior to d/c. Post hospital f/u noted on AVS. Pt without RX med concerns.  Father to provide transportation to home.  RNCM will sign off for now as intervention is no longer needed. Please consult [place] again if new needs arise.    Final next level of care: Home w Home Health Services Barriers to Discharge: No Barriers Identified   Patient Goals and CMS Choice   Choice offered to / list presented to : Patient  Discharge Placement                         Discharge Plan and Services Additional resources added to the After Visit Summary for                  DME Arranged: Walker rolling DME Agency: [facility] Date DME Agency Contacted: 07/08/22 Time DME Agency Contacted: 1513 Representative spoke with at DME Agency: Marthann Schiller HH Arranged: PT HH Agency: [facility] Date [facility] Agency Contacted: 07/08/22 Time HH Agency Contacted: 1541 Representative spoke with at [facility] Agency: [facility]  Social Determinants of Health (SDOH) Interventions SDOH Screenings   Depression (PHQ2-9): Low Risk  (04/30/2020)  Tobacco Use: High Risk (07/08/2022)     Readmission Risk Interventions     No data to display

## 2022-07-08 NOTE — Progress Notes (Signed)
Physical Therapy Treatment Patient Details Name: [name] MRN: [id number] DOB: 04/22/1970 Today's Date: 07/08/2022   History of Present Illness 52 y.o. female who presents with fall down steps and immediate hip pain. Brought ED 6/23 where x-rays showed a left hip fx. S/p 6/24 cephalomedullary nailing of her left hip.  PMH: hypertension, hypothyroidism, COPD, chronic pain, vertebral compression fractures, anemia, aspiration, pulmonary fibrosis, GERD, depression, anxiety, OCD, gastric ulcer and gastric outlet obstruction status post partial gastric resection    PT Comments    Pt tolerated treatment well today. Pt was able to complete stair training with step brought in room Min A. Pt has adequate support at home and is safe for DC home from a PT standpoint. No change in DC/DME recs at this time. Pt anticipates DC home either today or tomorrow. PT will continue to follow if still admitted.   Recommendations for follow up therapy are one component of a multi-disciplinary discharge planning process, led by the attending physician.  Recommendations may be updated based on patient status, additional functional criteria and insurance authorization.  Follow Up Recommendations       Assistance Recommended at Discharge Intermittent Supervision/Assistance  Patient can return home with the following A little help with walking and/or transfers;A little help with bathing/dressing/bathroom;Assistance with cooking/housework;Assist for transportation;Help with stairs or ramp for entrance   Equipment Recommendations  Rolling walker (2 wheels);BSC/3in1    Recommendations for Other Services       Precautions / Restrictions Precautions Precautions: Fall Restrictions Weight Bearing Restrictions: Yes LLE Weight Bearing: Weight bearing as tolerated     Mobility  Bed Mobility Overal bed mobility: Needs Assistance Bed Mobility: Supine to Sit     Supine to sit: Supervision           Transfers Overall transfer level: Needs assistance Equipment used: Rolling walker (2 wheels) Transfers: Sit to/from Stand Sit to Stand: Supervision                Ambulation/Gait                   Stairs Stairs: Yes Stairs assistance: Min assist Stair Management: No rails, Step to pattern, Forwards, Backwards Number of Stairs: 8 General stair comments: Using step in room and HHA to simulate home environment.   Wheelchair Mobility    Modified Rankin (Stroke Patients Only)       Balance Overall balance assessment: Needs assistance Sitting-balance support: Feet supported, No upper extremity supported, Feet unsupported, Single extremity supported, Bilateral upper extremity supported Sitting balance-Leahy Scale: Good Sitting balance - Comments: Able to EOB for prolonged period of time                                    Cognition Arousal/Alertness: Awake/alert Behavior During Therapy: WFL for tasks assessed/performed Overall Cognitive Status: Within Functional Limits for tasks assessed                                          Exercises      General Comments General comments (skin integrity, edema, etc.): VSS on RA      Pertinent Vitals/Pain Pain Assessment Pain Assessment: 0-10 Pain Score: 7  Pain Location: L hip operative site Pain Descriptors / Indicators: Constant, Throbbing, Sore, Grimacing Pain Intervention(s): RN gave pain meds during session,  Monitored during session    Home Living                          Prior Function            PT Goals (current goals can now be found in the care plan section) Progress towards PT goals: Progressing toward goals    Frequency    Min 5X/week      PT Plan Current plan remains appropriate    Co-evaluation              AM-PAC PT "6 Clicks" Mobility   Outcome Measure  Help needed turning from your back to your side while in a flat bed without  using bedrails?: A Little Help needed moving from lying on your back to sitting on the side of a flat bed without using bedrails?: A Little Help needed moving to and from a bed to a chair (including a wheelchair)?: A Little Help needed standing up from a chair using your arms (e.g., wheelchair or bedside chair)?: A Little Help needed to walk in hospital room?: A Little Help needed climbing 3-5 steps with a railing? : A Little 6 Click Score: 18    End of Session Equipment Utilized During Treatment: Gait belt Activity Tolerance: Patient tolerated treatment well;Patient limited by pain Patient left: in bed;with call bell/phone within reach;Other (comment) (MD present) Nurse Communication: Mobility status PT Visit Diagnosis: Unsteadiness on feet (R26.81);Other abnormalities of gait and mobility (R26.89);History of falling (Z91.81);Difficulty in walking, not elsewhere classified (R26.2);Pain Pain - Right/Left: Left Pain - part of body: Leg;Hip     Time: 1040-1107 PT Time Calculation (min) (ACUTE ONLY): 27 min  Charges:  $Gait Training: 23-37 mins                     Shela Nevin, PT, DPT Acute Rehab Services [phone]    Gladys Damme 07/08/2022, 12:22 PM

## 2022-07-08 NOTE — Hospital Course (Signed)
  Closed displaced intertrochanteric fracture of left femur Continue with post op care.  Pain control, DVT prophylaxis, and PT/OT.  Ortho recommended:  -Oxycodone and Flexeril for pain control -Aspirin 81 mg daily x 30 days for DVT prophylaxis -Continue 50,000 units Vit D supplementation weekly x 8 weeks - follow-up 2 weeks after discharge for wound check and repeat x-rays   Anemia' Hypotension Chronic hypotension, keep MAP 65 or greater.    COPD (chronic obstructive pulmonary disease No signs of acute exacerbation.  Hypertension-resolved as of 07/07/2022 Continue to hold on antihypertensive medications.   GERD (gastroesophageal reflux disease Continue antiacid therapy.Marland Kitchen   Sp gastrectomy, very poor oral intake and poor nutrition.  Severe calorie protein malnutrition.  Continue on dysphagia 3 diet. Poor prognosis, will consult palliative care.   Anxiety Depression.  Continue with quetiapine, paroxetine, and buspirone.   Chronic pain syndrome, continue with fentanyl and oxycodone.   Iron deficiency anemia Follow up on iron panel as outpatient.   Pulmonary fibrosis (HCC) Continue oxymetry monitoring and supplemental 02 per [place] to keep 02 saturation 88% or greater.

## 2022-07-08 NOTE — Discharge Instructions (Signed)
Orthopaedic Trauma Service Discharge Instructions   General Discharge Instructions  WEIGHT BEARING STATUS:weightbearing as tolerated  RANGE OF MOTION/ACTIVITY: ok for hip and knee motion as tolerated  Wound Care: Incisions can be left open to air if there is no drainage. Once the incision is completely dry and without drainage, it may be left open to air out.  Showering may begin post op day 3, (Thursday 07/06/22).  Clean incision gently with soap and water.  DVT/PE prophylaxis: Aspirin 81 mg daily x 30 days  Diet: as you were eating previously.  Can use over the counter stool softeners and bowel preparations, such as Miralax, to help with bowel movements.  Narcotics can be constipating.  Be sure to drink plenty of fluids  PAIN MEDICATION USE AND EXPECTATIONS  You have likely been given narcotic medications to help control your pain.  After a traumatic event that results in an fracture (broken bone) with or without surgery, it is ok to use narcotic pain medications to help control one's pain.  We understand that everyone responds to pain differently and each individual patient will be evaluated on a regular basis for the continued need for narcotic medications. Ideally, narcotic medication use should last no more than 6-8 weeks (coinciding with fracture healing).   As a patient it is your responsibility as well to monitor narcotic medication use and report the amount and frequency you use these medications when you come to your office visit.   We would also advise that if you are using narcotic medications, you should take a dose prior to therapy to maximize you participation.  IF YOU ARE ON NARCOTIC MEDICATIONS IT IS NOT PERMISSIBLE TO OPERATE A MOTOR VEHICLE (MOTORCYCLE/CAR/TRUCK/MOPED) OR HEAVY MACHINERY DO NOT MIX NARCOTICS WITH OTHER CNS (CENTRAL NERVOUS SYSTEM) DEPRESSANTS SUCH AS ALCOHOL   STOP SMOKING OR USING NICOTINE PRODUCTS!!!!  As discussed nicotine severely impairs your  body's ability to heal surgical and traumatic wounds but also impairs bone healing.  Wounds and bone heal by forming microscopic blood vessels (angiogenesis) and nicotine is a vasoconstrictor (essentially, shrinks blood vessels).  Therefore, if vasoconstriction occurs to these microscopic blood vessels they essentially disappear and are unable to deliver necessary nutrients to the healing tissue.  This is one modifiable factor that you can do to dramatically increase your chances of healing your injury.    (This means no smoking, no nicotine gum, patches, etc)  DO NOT USE NONSTEROIDAL ANTI-INFLAMMATORY DRUGS (NSAID'S)  Using products such as Advil (ibuprofen), Aleve (naproxen), Motrin (ibuprofen) for additional pain control during fracture healing can delay and/or prevent the healing response.  If you would like to take over the counter (OTC) medication, Tylenol (acetaminophen) is ok.  However, some narcotic medications that are given for pain control contain acetaminophen as well. Therefore, you should not exceed more than 4000 mg of tylenol in a day if you do not have liver disease.  Also note that there are may OTC medicines, such as cold medicines and allergy medicines that my contain tylenol as well.  If you have any questions about medications and/or interactions please ask your doctor/PA or your pharmacist.      ICE AND ELEVATE INJURED/OPERATIVE EXTREMITY  Using ice and elevating the injured extremity above your heart can help with swelling and pain control.  Icing in a pulsatile fashion, such as 20 minutes on and 20 minutes off, can be followed.    Do not place ice directly on skin. Make sure there is a barrier  between to skin and the ice pack.    Using frozen items such as frozen peas works well as the conform nicely to the are that needs to be iced.  USE AN ACE WRAP OR TED HOSE FOR SWELLING CONTROL  In addition to icing and elevation, Ace wraps or TED hose are used to help limit and resolve  swelling.  It is recommended to use Ace wraps or TED hose until you are informed to stop.    When using Ace Wraps start the wrapping distally (farthest away from the body) and wrap proximally (closer to the body)   Example: If you had surgery on your leg or thing and you do not have a splint on, start the ace wrap at the toes and work your way up to the thigh        If you had surgery on your upper extremity and do not have a splint on, start the ace wrap at your fingers and work your way up to the upper arm   CALL THE OFFICE WITH ANY QUESTIONS OR CONCERNS: [phone]   VISIT OUR WEBSITE FOR ADDITIONAL INFORMATION: [email or web address]   Discharge Wound Care Instructions  Do NOT apply any ointments, solutions or lotions to pin sites or surgical wounds.  These prevent needed drainage and even though solutions like hydrogen peroxide kill bacteria, they also damage cells lining the pin sites that help fight infection.  Applying lotions or ointments can keep the wounds moist and can cause them to breakdown and open up as well. This can increase the risk for infection. When in doubt call the office.  If any drainage is noted, use foam dressings (mepilex) - These dressing supplies should be available at local medical supply stores [facility], [facility], etc) as well as Insurance claims handler ([facility], [facility], [employer], etc)  Once the incision is completely dry and without drainage, it may be left open to air out.  Showering may begin 36-48 hours later.  Cleaning gently with soap and water.

## 2022-07-08 NOTE — Progress Notes (Signed)
    Durable Medical Equipment  (From admission, onward)           Start     Ordered   07/08/22 1510  For home use only DME Walker rolling  Once       Question Answer Comment  Walker: With 5 Inch Wheels   Patient needs a walker to treat with the following condition Fx      07/08/22 1510

## 2022-07-08 NOTE — Progress Notes (Addendum)
Orthopaedic Trauma Progress Note  SUBJECTIVE: Doing okay this morning, pain fairly manageable on current regimen.  Toradol stopped yesterday due to elevated creatinine, creatinine levels have returned to normal levels this morning.  No other specific complaints currently.  Patient notes she has several steps to enter her home, plans to work with therapies on this today. Patient very eager to return home  OBJECTIVE:  Vitals:   07/08/22 0442 07/08/22 0706  BP: 101/64 116/82  Pulse: 72 77  Resp: 16 20  Temp:  98.3 F (36.8 C)  SpO2: 98% 100%    General: Sitting up in bed, no acute distress Respiratory: No increased work of breathing.  Left lower extremity: Dressings removed, incisions are clean, dry, intact.  Tenderness over the hip and proximal thigh as expected.  Less tender through the distal thigh and lower leg.  No calf tenderness.  Compartment soft/compressible.  Ankle DF/PF is intact.  Able to wiggle toes.  Endorses sensation to light touch throughout distal extremity. + DP pulse  IMAGING: Stable post op imaging.   LABS:  Results for orders placed or performed during the hospital encounter of 07/05/22 (from the past 24 hour(s))  CBC     Status: Abnormal   Collection Time: 07/08/22  2:47 AM  Result Value Ref Range   WBC 7.9 4.0 - 10.5 K/uL   RBC 2.88 (L) 3.87 - 5.11 MIL/uL   Hemoglobin 6.3 (LL) 12.0 - 15.0 g/dL   HCT 62.3 (L) 76.2 - 83.1 %   MCV 77.1 (L) 80.0 - 100.0 fL   MCH 21.9 (L) 26.0 - 34.0 pg   MCHC 28.4 (L) 30.0 - 36.0 g/dL   RDW 51.7 (H) 61.6 - 07.3 %   Platelets 190 150 - 400 K/uL   nRBC 0.0 0.0 - 0.2 %  Basic metabolic panel     Status: Abnormal   Collection Time: 07/08/22  2:47 AM  Result Value Ref Range   Sodium 136 135 - 145 mmol/L   Potassium 4.0 3.5 - 5.1 mmol/L   Chloride 108 98 - 111 mmol/L   CO2 23 22 - 32 mmol/L   Glucose, Bld 95 70 - 99 mg/dL   BUN 17 6 - 20 mg/dL   Creatinine, Ser 7.10 0.44 - 1.00 mg/dL   Calcium 7.6 (L) 8.9 - 10.3 mg/dL   GFR,  Estimated >62 >69 mL/min   Anion gap 5 5 - 15  Prepare RBC (crossmatch)     Status: None   Collection Time: 07/08/22  3:29 AM  Result Value Ref Range   Order Confirmation      ORDER PROCESSED BY BLOOD BANK Performed at [facility], [address] [place], [place] [address]     ASSESSMENT: [name] is a 52 y.o. female, 2 Days Post-Op s/p INTRAMEDULLARY NAIL LEFT INTERTROCHANTERIC FEMUR FRACTURE  CV/Blood loss: Acute blood loss anemia, Hgb 6.3 this morning.  1 unit PRBCs has been ordered per primary team.  Hemodynamically stable  PLAN: Weightbearing: WBAT LLE ROM: Okay for unrestricted ROM Incisional and dressing care: Okay to leave incisions open to air Showering: Okay to begin getting incisions wet  Orthopedic device(s): None  Pain management:  1. Tylenol 325-650 mg q 6 hours PRN 2. Flexeril 5 mg 3 times daily PRN 3. Oxycodone 5 mg q 6 hours PRN 4. Dilaudid 1 mg q 3 hours PRN 5. Gabapentin 800 mg TID VTE prophylaxis: Hold Lovenox until hemoglobin stabilizes, SCDs ID:  Ancef 2gm post op completed  Foley/Lines:  No foley, KVO IVFs Impediments to Fracture Healing: Vitamin D level 12, started on supplementation Dispo: PT/OT evaluation ongoing.  Continue to monitor CBC following PRBC transfusion today.  Patient stable for discharge from orthopedic standpoint once cleared by medicine team and therapies.  I have sent discharge Rx for pain medication, DVT prophylaxis, vitamin D supplementation to pharmacy on file  D/C recommendations: -Oxycodone and Flexeril for pain control -Aspirin 81 mg daily x 30 days for DVT prophylaxis -Continue 50,000 units Vit D supplementation weekly x 8 weeks  Follow - up plan: 2 weeks after discharge for wound check and repeat x-rays   Contact information:  Truitt Merle MD, Thyra Breed PA-C. After hours and holidays please check [email or web address] for group call information for [facility]   Thompson Caul, PA-C [phone]  (office) [email or web address]

## 2022-07-08 NOTE — Consult Note (Signed)
Consultation Note Date: 07/08/2022   Patient Name: [name]  DOB: 1970-02-29  MRN: [id number]  Age / Sex: 52 y.o., female  PCP: Lonie Peak, PA-C Referring Physician: Cathleen Corti, MD  Reason for Consultation: Establishing goals of care  HPI/Patient Profile: 52 y.o. female  with past medical history of  HTN, hypothyroid, COPD/pulm fibros, chron pain/vert compres fx, anemia, GERD/asp, gastric ulcer/outlet obstruct sp partial resection-jejunostomy--> c takedown , depress/anxiety/OCD admitted on 07/05/2022 with fall with left hip fracture and repair.   Clinical Assessment and Goals of Care: I have reviewed medical records including EPIC notes, labs and imaging, received report from RN.   Call to [name] to discuss diagnosis prognosis, GOC, EOL wishes, disposition and options. I introduced Palliative Medicine as specialized medical care for people living with serious illness. It focuses on providing relief from the symptoms and stress of a serious illness. The goal is to improve quality of life for both the patient and the family.  We discussed a brief life review of the patient.  [name] tells me that she has been disabled for many years but works part-time at [employer] to get out of the house and her an extra money.  Her 74 year old son lives with her.  Prior to this acute injury she had been independent with ADLs/IADLs.  We then focused on their current illness.  We talk about her hip fracture and repair.  She tells me that she is having pain, but this is much improved after repair.  She tells me that she has moved her bowels.  We talk about home with home health services.  She is agreeable.  The natural disease trajectory and expectations at EOL were discussed.  Advanced directives, concepts specific to code status, were considered and discussed.  We talk about choices changing as our bodies  change and we age.  We talk about the importance of letting her loved ones know what she would and would not want.  Palliative Care services outpatient were explained and offered.  We talked about the benefits of outpatient palliative services for NP visits every month offering tips and tricks.  We talk about how to put this in place with her PCP.  Discussed the importance of continued conversation with family and the medical providers regarding overall plan of care and treatment options, ensuring decisions are within the context of the patient's values and GOCs.  Questions and concerns were addressed. The patient was encouraged to call with questions or concerns.  PMT will continue to support holistically.   HCPOA  NEXT OF KIN -[name] tells me that she is separated but not divorced.  She shares that she would like to name her father [name] as her healthcare surrogate along with her 69 year old son, [name].  I have requested for bedside nursing staff to deliver HCPOA/AD paperwork for her review.  We talk about how to complete this paperwork outpatient.    SUMMARY OF RECOMMENDATIONS   At this point continue full scope/full code  Discharging home with home health today. Encouraged to consider outpatient palliative for support, discussed how to put this in place   Code Status/Advance Care Planning: Full code  Symptom Management:  Per hospitalist, no additional needs at this time.  Tells me that she has moved her bowels.  Palliative Prophylaxis:  Frequent Pain Assessment  Additional Recommendations (Limitations, Scope, Preferences): Full Scope Treatment  Psycho-social/Spiritual:  Desire for further Chaplaincy support:no Additional Recommendations: Caregiving  Support/Resources  Prognosis:  Unable to determine 1 year or more would not be surprising based on chronic illness burden with relatively young age.  Discharge Planning: Home with Home Health      Primary  Diagnoses: Present on Admission:  Pulmonary fibrosis (HCC)  GERD (gastroesophageal reflux disease)  COPD (chronic obstructive pulmonary disease) (HCC)  (Resolved) Anemia  Iron deficiency anemia  (Resolved) Hypertension  Anxiety  Closed displaced intertrochanteric fracture of left femur (HCC)   I have reviewed the medical record, interviewed the patient and family, and examined the patient. The following aspects are pertinent.  Past Medical History:  Diagnosis Date   Acute encephalopathy 01/14/2021   Acute respiratory failure with hypoxia (HCC) 01/03/2021   Allergic rhinitis    Altered mental status    Anemia    Anxiety    Anxiety disorder    Aspiration into airway    per pt hx on 08/07/19 aspirates food into lungs when asleep    Calcaneus fracture, right 06/19/2017   Chronic interstitial cystitis    Chronic pain 06/19/2017   Closed fracture of right distal tibia 06/17/2017   Community acquired pneumonia    COPD (chronic obstructive pulmonary disease) (HCC)    Depression    Fibromyalgia    Gastric outlet obstruction    Gastric ulcer    GERD (gastroesophageal reflux disease)    Hiatal hernia    History of kidney stones    Hypothyroidism    in the past   IBS (irritable bowel syndrome)    IDA (iron deficiency anemia)    Internal hemorrhoids    Kidney stones    Myalgia    OCD (obsessive compulsive disorder)    Pneumonia    Ulcerative esophagitis    Vitamin D deficiency    Social History   Socioeconomic History   Marital status: Divorced    Spouse name: Not on file   Number of children: 1   Years of education: 16   Highest education level: Not on file  Occupational History   Occupation: Software engineer: [employer]  Tobacco Use   Smoking status: Every Day    Packs/day: .5    Types: Cigarettes   Smokeless tobacco: Never   Tobacco comments:    10 cigarettes smoked daily ARJ 01/23/21  Vaping Use   Vaping Use: Never used  Substance and Sexual Activity    Alcohol use: Not Currently   Drug use: Never   Sexual activity: Never  Other Topics Concern   Not on file  Social History Narrative   ** Merged History Encounter **       Social Determinants of Health   Financial Resource Strain: Not on file  Food Insecurity: Not on file  Transportation Needs: Not on file  Physical Activity: Not on file  Stress: Not on file  Social Connections: Not on file   Family History  Problem Relation Age of Onset   Clotting disorder Mother    Crohn's disease Mother    Heart disease Mother  Emphysema Mother    Heart disease Father    Breast cancer Maternal Aunt    Colon cancer Neg Hx    Stomach cancer Neg Hx    Rectal cancer Neg Hx    Esophageal cancer Neg Hx    Scheduled Meds:  busPIRone  15 mg Oral BID   Chlorhexidine Gluconate Cloth  6 each Topical Q0600   docusate sodium  100 mg Oral BID   [START ON 07/09/2022] enoxaparin (LOVENOX) injection  15 mg Subcutaneous Q24H   feeding supplement  1 Container Oral TID BM   fentaNYL  1 patch Transdermal Q3 days   gabapentin  800 mg Oral TID   multivitamin with minerals  1 tablet Oral Daily   nicotine  21 mg Transdermal Daily   pantoprazole  40 mg Oral Daily   PARoxetine  20 mg Oral QHS   QUEtiapine  200 mg Oral QHS   Vitamin D (Ergocalciferol)  50,000 Units Oral Q7 days   Continuous Infusions:  sodium chloride 100 mL/hr at 07/07/22 1536   PRN Meds:.acetaminophen, albuterol, cyclobenzaprine, HYDROmorphone (DILAUDID) injection, metoCLOPramide **OR** metoCLOPramide (REGLAN) injection, naloxone, ondansetron **OR** ondansetron (ZOFRAN) IV, oxyCODONE, polyethylene glycol, polyvinyl alcohol, traZODone Medications Prior to Admission:  Prior to Admission medications   Medication Sig Start Date End Date Taking? Authorizing Provider  acetaminophen (TYLENOL) 325 MG tablet Take 650 mg by mouth every 6 (six) hours as needed for moderate pain.   Yes [provider]  albuterol (VENTOLIN HFA) 108 (90  Base) MCG/ACT inhaler Inhale 1-2 puffs into the lungs 4 (four) times daily as needed for shortness of breath or wheezing. 12/26/20  Yes [provider]  aspirin EC 81 MG tablet Take 1 tablet (81 mg total) by mouth daily. Swallow whole. 07/08/22 08/07/22 Yes McClung, Shawn Route, PA-C  Aspirin-Salicylamide-Caffeine (BC HEADACHE PO) Take 1 packet by mouth daily as needed (pain/headache).    Yes [provider]  busPIRone (BUSPAR) 15 MG tablet Take 15 mg by mouth 2 (two) times daily. 12/28/20  Yes [provider]  cyclobenzaprine (FLEXERIL) 5 MG tablet Take 5 mg by mouth 3 (three) times daily. 12/19/20  Yes [provider]  fentaNYL (DURAGESIC) 25 MCG/HR Place 1 patch onto the skin every 3 (three) days. 06/05/21  Yes [provider]  gabapentin (NEURONTIN) 800 MG tablet Take 800 mg by mouth 3 (three) times daily. 12/16/20  Yes [provider]  hydrOXYzine (ATARAX/VISTARIL) 25 MG tablet Take 25-50 mg by mouth 3 (three) times daily. 02/08/17  Yes [provider]  naloxone (NARCAN) nasal spray 4 mg/0.1 mL Place 1 spray into the nose once as needed (opioid reversal). 10/08/20  Yes [provider]  ondansetron (ZOFRAN-ODT) 4 MG disintegrating tablet TAKE 1 TABLET (4 MG TOTAL) BY MOUTH EVERY 6 (SIX) HOURS AS NEEDED FOR NAUSEA OR VOMITING 01/08/21  Yes Pyrtle, Carie Caddy, MD  oxyCODONE (OXY IR/ROXICODONE) 5 MG immediate release tablet Take 5 mg by mouth 3 (three) times daily. 06/11/22  Yes [provider]  PARoxetine (PAXIL) 20 MG tablet Take 20 mg by mouth at bedtime.   Yes [provider]  polyvinyl alcohol (LIQUIFILM TEARS) 1.4 % ophthalmic solution Place 1 drop into both eyes as needed for dry eyes.   Yes [provider]  QUEtiapine (SEROQUEL) 200 MG tablet Take 200 mg by mouth at bedtime. 12/14/20  Yes [provider]  traZODone (DESYREL) 50 MG tablet Take 50 mg by mouth at bedtime as needed for sleep. 10/11/20  Yes  [provider]  amLODipine (NORVASC) 5 MG tablet Take 1 tablet (5 mg total) by mouth daily. Patient not taking: Reported on 07/05/2022 01/15/21 05/15/21  Meredeth Ide, MD  levothyroxine (SYNTHROID) 25 MCG tablet Take 1 tablet (25 mcg total) by mouth daily before breakfast. Patient not taking: Reported on 06/16/2021 01/15/21   Meredeth Ide, MD  nicotine polacrilex (NICORETTE) 2 MG gum Take 1 each (2 mg total) by mouth as needed for smoking cessation. Patient not taking: Reported on 06/16/2021 02/27/21   Omar Person, MD  oxyCODONE (OXY IR/ROXICODONE) 5 MG immediate release tablet Take 1 tablet (5 mg total) by mouth every 6 (six) hours as needed for moderate pain or severe pain. 07/08/22   West Bali, PA-C  pantoprazole (PROTONIX) 40 MG tablet Take 1 tablet (40 mg total) by mouth 2 (two) times daily. Patient taking differently: Take 40 mg by mouth daily. 01/05/21 01/05/22  Ghimire, Werner Lean, MD  Vitamin D, Ergocalciferol, (DRISDOL) 1.25 MG (50000 UNIT) CAPS capsule Take 1 capsule (50,000 Units total) by mouth every 7 (seven) days. 07/14/22   West Bali, PA-C   Allergies  Allergen Reactions   Sulfamethoxazole Anaphylaxis   Morphine And Codeine Other (See Comments)    Delirium. Makes me go "crazy"   Xanax Xr [Alprazolam Er] Other (See Comments)    Enhanced depression; feels gloomy   Dilaudid [Hydromorphone] Nausea And Vomiting    "Severe vomiting "    Morphine And Codeine    Sulfa Antibiotics    Review of Systems  Unable to perform ROS: Other    Physical Exam Vitals and nursing note reviewed.     Vital Signs: BP 119/80 (BP Location: Left Arm)   Pulse 73   Temp 98.2 F (36.8 C) (Oral)   Resp 18   Ht 4\' 11"  (1.499 m)   Wt 29 kg   SpO2 100%   BMI 12.91 kg/m  Pain Scale: 0-10 POSS *See Group Information*: 1-Acceptable,Awake and alert Pain Score: 6    SpO2: SpO2: 100 % O2 Device:SpO2: 100 % O2 Flow Rate: .O2 Flow Rate (L/min): 0 L/min  IO: Intake/output  summary:  Intake/Output Summary (Last 24 hours) at 07/08/2022 0946 Last data filed at 07/08/2022 0700 Gross per 24 hour  Intake 1646.74 ml  Output 250 ml  Net 1396.74 ml    LBM: Last BM Date : 07/07/22 Baseline Weight: Weight: 28.1 kg Most recent weight: Weight: 29 kg     Palliative Assessment/Data:     Time In: 0830 Time Out: 0910 Time Total: 40 minutes  Greater than 50%  of this time was spent counseling and coordinating care related to the above assessment and plan.  Signed by: Katheran Awe, NP   Please contact Palliative Medicine Team phone at [phone] for questions and concerns.  For individual provider: See Loretha Stapler

## 2022-07-08 NOTE — Discharge Summary (Signed)
Physician Discharge Summary   Patient: [name] MRN: [id number] DOB: [birth year]  Admit date:     07/05/2022  Discharge date: 07/08/22  Discharge Physician: Rolm Gala   PCP: Lonie Peak, PA-C   Recommendations at discharge:    Oxycodone and Flexeril for pain control Aspirin 81 mg daily x 30 days for DVT prophylaxis Continue 50,000 units Vit D supplementation weekly x 8 weeks Follow-up 2 weeks after discharge for wound check and repeat x-rays  Required 1 unit RBC postoperatively on the day of discharge, H&H on discharge 10.  Repeat CBC in 2 to 3 days Follow-up iron panel as an outpatient Continue on dysphagia 3 diet Overall poor prognosis-consider palliative consult as an outpatient Consider restarting home anti-hypertensives as able Discharging home with home health  Discharge Diagnoses: Principal Problem:   Closed displaced intertrochanteric fracture of left femur (HCC) Active Problems:   Hypotension   COPD (chronic obstructive pulmonary disease) (HCC)   GERD (gastroesophageal reflux disease)   Anxiety   Iron deficiency anemia   Pulmonary fibrosis (HCC)  Resolved Problems:   Hypertension   Anemia  Hospital Course:  [name] is a 52 y.o. female with medical history significant of hypertension, hypothyroidism, COPD, chronic pain, vertebral compression fractures, anemia, aspiration, pulmonary fibrosis, GERD, depression, anxiety, OCD, gastric ulcer and gastric outlet obstruction status post partial gastric resection and jejunostomy now status post jejunostomy takedown who presents with fall and hip pain.  Found to have a left femur fracture.  Assessment and Plan:  Closed displaced intertrochanteric fracture of left femur Patient fell down stairs at church after losing balance.  She reported left hip pain following fall.   X-rays showed mildly displaced trochanteric femur fracture on the left.  Ortho consulted, surgery for intramedullary nail left  intertrochanteric femur fracture was done on 6/24 6/24.  Patient did well with PT and OT and is now ready for discharge.  Additional Ortho recommendations as above.  Anemia Patient has history of iron deficiency anemia.  Hemoglobin on 07/08/22 was 6.32 days postoperatively.  S/p 1 unit transfusion.  Hemoglobin 10 at the time of discharge.  Patient agreeable to PCP follow-up later this week to recheck CBC. Follow up on iron panel as outpatient.   Hypertension Continue to hold on antihypertensive medications.   GERD (gastroesophageal reflux disease Continued home meds  Anxiety Depression Continued quetiapine, paroxetine, and buspirone.   Chronic pain syndrome Continued with fentanyl, oxycodone and Flexeril.  All other conditions remained chronic and stable.      Consultants: ortho  Procedures performed: Intramedullary nail left intertrochanteric femur fracture Disposition: Home Diet recommendation:  Regular diet DISCHARGE MEDICATION: Allergies as of 07/08/2022       Reactions   Sulfamethoxazole Anaphylaxis   Morphine And Codeine Other (See Comments)   Delirium. Makes me go "crazy"   Xanax Xr [alprazolam Er] Other (See Comments)   Enhanced depression; feels gloomy   Dilaudid [hydromorphone] Nausea And Vomiting   "Severe vomiting "    Morphine And Codeine    Sulfa Antibiotics         Medication List     STOP taking these medications    amLODipine 5 MG tablet Commonly known as: NORVASC   BC HEADACHE PO   nicotine polacrilex 2 MG gum Commonly known as: NICORETTE       TAKE these medications    acetaminophen 325 MG tablet Commonly known as: TYLENOL Take 650 mg by mouth every 6 (six) hours as needed for moderate  pain.   albuterol 108 (90 Base) MCG/ACT inhaler Commonly known as: VENTOLIN HFA Inhale 1-2 puffs into the lungs 4 (four) times daily as needed for shortness of breath or wheezing.   aspirin EC 81 MG tablet Take 1 tablet (81 mg total) by mouth  daily. Swallow whole.   busPIRone 15 MG tablet Commonly known as: BUSPAR Take 15 mg by mouth 2 (two) times daily.   cyclobenzaprine 5 MG tablet Commonly known as: FLEXERIL Take 5 mg by mouth 3 (three) times daily.   fentaNYL 25 MCG/HR Commonly known as: DURAGESIC Place 1 patch onto the skin every 3 (three) days.   gabapentin 800 MG tablet Commonly known as: NEURONTIN Take 800 mg by mouth 3 (three) times daily.   hydrOXYzine 25 MG tablet Commonly known as: ATARAX Take 25-50 mg by mouth 3 (three) times daily.   levothyroxine 25 MCG tablet Commonly known as: Synthroid Take 1 tablet (25 mcg total) by mouth daily before breakfast.   naloxone 4 MG/0.1ML Liqd nasal spray kit Commonly known as: NARCAN Place 1 spray into the nose once as needed (opioid reversal).   ondansetron 4 MG disintegrating tablet Commonly known as: ZOFRAN-ODT TAKE 1 TABLET (4 MG TOTAL) BY MOUTH EVERY 6 (SIX) HOURS AS NEEDED FOR NAUSEA OR VOMITING   oxyCODONE 5 MG immediate release tablet Commonly known as: Oxy IR/ROXICODONE Take 1 tablet (5 mg total) by mouth every 6 (six) hours as needed for moderate pain or severe pain. What changed:  when to take this reasons to take this   pantoprazole 40 MG tablet Commonly known as: Protonix Take 1 tablet (40 mg total) by mouth 2 (two) times daily. What changed: when to take this   PARoxetine 20 MG tablet Commonly known as: PAXIL Take 20 mg by mouth at bedtime.   polyvinyl alcohol 1.4 % ophthalmic solution Commonly known as: LIQUIFILM TEARS Place 1 drop into both eyes as needed for dry eyes.   QUEtiapine 200 MG tablet Commonly known as: SEROQUEL Take 200 mg by mouth at bedtime.   traZODone 50 MG tablet Commonly known as: DESYREL Take 50 mg by mouth at bedtime as needed for sleep.   Vitamin D (Ergocalciferol) 1.25 MG (50000 UNIT) Caps capsule Commonly known as: DRISDOL Take 1 capsule (50,000 Units total) by mouth every 7 (seven) days. Start taking  on: July 14, 2022               Durable Medical Equipment  (From admission, onward)           Start     Ordered   07/08/22 1539  For home use only DME Walker rolling  Once       Question Answer Comment  Walker: With 5 Inch Wheels   Patient needs a walker to treat with the following condition Left hip pain      07/08/22 1538   07/08/22 1510  For home use only DME Walker rolling  Once       Question Answer Comment  Walker: With 5 Inch Wheels   Patient needs a walker to treat with the following condition Fx      07/08/22 1510            Follow-up Information     Lonie Peak, PA-C Follow up.   Specialty: Physician Assistant Contact information: [address] [place] [address] [phone]         Roby Lofts, MD. Schedule an appointment as soon as possible for a  visit in 2 week(s).   Specialty: Orthopedic Surgery Why: for wound check and repeat x-rays Contact information: [address] [place] [address] [phone]         [facility] Follow up.   Why: home health PT services will be provided by [facility], start of care within 48 hours post discharge Contact information: [address] [place] [address] [phone]                Discharge Exam: Filed Weights   07/05/22 1215 07/06/22 0852  Weight: 28.1 kg 29 kg   General: Alert, no acute distress, frail appearing  Cardio: Normal S1 and S2, RRR, no r/m/g Pulm: CTAB, normal work of breathing Abdomen: Bowel sounds normal. Abdomen soft and non-tender.  Extremities: No peripheral edema. Left hip op site clean and dry  Neuro: Cranial nerves grossly intact   Condition at discharge: good  The results of significant diagnostics from this hospitalization (including imaging, microbiology, ancillary and laboratory) are listed below for reference.   Imaging Studies: DG HIP PORT UNILAT W OR W/O PELVIS 1V LEFT  Result Date:  07/06/2022 CLINICAL DATA:  Status post ORIF proximal left femoral fracture EXAM: DG HIP (WITH OR WITHOUT PELVIS) 1V PORT LEFT COMPARISON:  Intraoperative films from earlier the same day. FINDINGS: Pelvic ring is intact. InterStim device is noted. Medullary rod and fixation screws are noted in the proximal left femur. Fracture fragments are in near anatomic alignment. IMPRESSION: Status post ORIF of left femoral fracture. Electronically Signed   By: Alcide Clever M.D.   On: 07/06/2022 12:07   DG HIP UNILAT WITH PELVIS 2-3 VIEWS LEFT  Result Date: 07/06/2022 CLINICAL DATA:  Left intratrochanteric fracture EXAM: DG HIP (WITH OR WITHOUT PELVIS) 2-3V LEFT COMPARISON:  07/05/2022 FLUOROSCOPY TIME:  Radiation Exposure Index (as provided by the fluoroscopic device): 1.74 mGy If the device does not provide the exposure index: Fluoroscopy Time:  50 seconds Number of Acquired Images:  5 FINDINGS: Initial images show reduction of previously seen intratrochanteric fracture. Proximal medullary rod with multiple fixation screws was then placed. Fracture fragments are in near anatomic alignment. IMPRESSION: ORIF of proximal left femoral fracture. Electronically Signed   By: Alcide Clever M.D.   On: 07/06/2022 10:53   DG C-Arm 1-60 Min-No Report  Result Date: 07/06/2022 Fluoroscopy was utilized by the requesting physician.  No radiographic interpretation.   DG Hip Unilat With Pelvis 2-3 Views Left  Result Date: 07/05/2022 CLINICAL DATA:  Left hip pain after fall. EXAM: DG HIP (WITH OR WITHOUT PELVIS) 2-3V LEFT COMPARISON:  None Available. FINDINGS: Mildly displaced intertrochanteric fracture is seen involving proximal left femur. No dislocation is noted. IMPRESSION: Mildly displaced intertrochanteric fracture of proximal left femur. Electronically Signed   By: Lupita Raider M.D.   On: 07/05/2022 14:40    Microbiology: Results for orders placed or performed during the hospital encounter of 07/05/22  Surgical pcr  screen     Status: None   Collection Time: 07/06/22  9:09 AM   Specimen: Nasal Mucosa; Nasal Swab  Result Value Ref Range Status   MRSA, PCR NEGATIVE NEGATIVE Final   Staphylococcus aureus NEGATIVE NEGATIVE Final    Comment: (NOTE) The Xpert SA Assay (FDA approved for NASAL specimens in patients 48 years of age and older), is one component of a comprehensive surveillance program. It is not intended to diagnose infection nor to guide or monitor treatment. Performed at [facility]  Lab, [address] [place], [place] [address]     Labs: CBC: Recent Labs  Lab 07/05/22 1218 07/06/22 1333 07/07/22 0105 07/08/22 0247 07/08/22 0959  WBC 10.6* 14.0* 12.1* 7.9  --   NEUTROABS 7.3  --   --   --   --   HGB 9.2* 9.3* 7.4* 6.3* 10.0*  HCT 32.1* 33.1* 25.5* 22.2* 32.1*  MCV 76.2* 78.1* 75.2* 77.1*  --   PLT 276 230 222 190  --    Basic Metabolic Panel: Recent Labs  Lab 07/05/22 1218 07/06/22 1333 07/07/22 0105 07/08/22 0247  NA 137  --  134* 136  K 4.3  --  4.3 4.0  CL 103  --  102 108  CO2 22  --  25 23  GLUCOSE 70  --  99 95  BUN 12  --  22* 17  CREATININE 0.52 0.74 1.04* 0.86  CALCIUM 8.4*  --  8.1* 7.6*   Liver Function Tests: Recent Labs  Lab 07/05/22 1218  AST 10*  ALT 11  ALKPHOS 93  BILITOT 0.3  PROT 5.7*  ALBUMIN 2.4*   CBG: No results for input(s): "GLUCAP" in the last 168 hours.  Discharge time spent: less than 30 minutes.  Signed: Rolm Gala, MD [facility] 07/08/2022

## 2022-07-08 NOTE — Progress Notes (Signed)
Occupational Therapy Treatment Patient Details Name: [name] MRN: [id number] DOB: 03-Apr-1970 Today's Date: 07/08/2022   History of present illness 52 y.o. female who presents with fall down steps and immediate hip pain. Brought ED 6/23 where x-rays showed a left hip fx. S/p 6/24 cephalomedullary nailing of her left hip.  PMH: hypertension, hypothyroidism, COPD, chronic pain, vertebral compression fractures, anemia, aspiration, pulmonary fibrosis, GERD, depression, anxiety, OCD, gastric ulcer and gastric outlet obstruction status post partial gastric resection   OT comments  Pt continuing to progress during OT sessions, initially ambulating with supervision but seeming to have more pain by end of session so OT provided min guard for safety. Educated pt on the use of wall support to complete tub transfers but pt seemingly a bit shaky and anxious, discussed and demonstrated that due to patient's shorter stature she could have son assist with controlled descent onto tub ledge then perform a posterior transfer onto her tub seat, pt declined wanting to attempt transfer but verbalized understanding of strategy. Pain continues to limit patient during mobility. OT to continue to progress pt as able, DC plans remain appropriate for [facility].    Recommendations for follow up therapy are one component of a multi-disciplinary discharge planning process, led by the attending physician.  Recommendations may be updated based on patient status, additional functional criteria and insurance authorization.    Assistance Recommended at Discharge Frequent or constant Supervision/Assistance  Patient can return home with the following  A little help with walking and/or transfers;A lot of help with bathing/dressing/bathroom;Assistance with cooking/housework;Assist for transportation;Help with stairs or ramp for entrance   Equipment Recommendations  None recommended by OT (Rec shower seat, which patient has)     Recommendations for Other Services      Precautions / Restrictions Precautions Precautions: Fall Restrictions Weight Bearing Restrictions: Yes LLE Weight Bearing: Weight bearing as tolerated       Mobility Bed Mobility Overal bed mobility: Needs Assistance Bed Mobility: Supine to Sit, Sit to Supine     Supine to sit: Supervision Sit to supine: Supervision   General bed mobility comments: Educated pt on the use of a gait belt as leg lifter to assist with LLE control during bed mobility    Transfers Overall transfer level: Needs assistance Equipment used: Rolling walker (2 wheels) Transfers: Sit to/from Stand, Bed to chair/wheelchair/BSC Sit to Stand: Supervision Stand pivot transfers: Min guard         General transfer comment: STS from tub was min guard, pt noted to be wincing in pain at end of session     Balance Overall balance assessment: Needs assistance Sitting-balance support: Feet supported, No upper extremity supported, Feet unsupported, Single extremity supported, Bilateral upper extremity supported Sitting balance-Leahy Scale: Good     Standing balance support: Single extremity supported, During functional activity, Bilateral upper extremity supported Standing balance-Leahy Scale: Poor                             ADL either performed or assessed with clinical judgement   ADL Overall ADL's : Needs assistance/impaired                                 Tub/ Shower Transfer: Tub transfer;Rolling walker (2 wheels);Shower seat;Min Psychologist, prison and probation services Details (indicate cue type and reason): Eductaed pt on the use of UE support against walk to walk along into  tub, pt needing min guard assist but looks very anxious during transfer. Pt also shorter in stature, demonstrated ability to position self on the edge of tub with assistance then perform a posterior transfer into her tub seat. Functional mobility during ADLs: Min guard;Rolling  walker (2 wheels)      Extremity/Trunk Assessment              Vision       Perception     Praxis      Cognition Arousal/Alertness: Awake/alert Behavior During Therapy: WFL for tasks assessed/performed Overall Cognitive Status: Within Functional Limits for tasks assessed                                          Exercises      Shoulder Instructions       General Comments      Pertinent Vitals/ Pain       Pain Assessment Pain Assessment: 0-10 Pain Score: 6  Pain Location: L hip operative site Pain Descriptors / Indicators: Constant, Throbbing, Sore, Grimacing Pain Intervention(s): Monitored during session, Repositioned  Home Living                                          Prior Functioning/Environment              Frequency  Min 3X/week        Progress Toward Goals  OT Goals(current goals can now be found in the care plan section)  Progress towards OT goals: Progressing toward goals  Acute Rehab OT Goals Patient Stated Goal: to go home OT Goal Formulation: With patient Time For Goal Achievement: 07/21/22 Potential to Achieve Goals: Good  Plan Discharge plan remains appropriate;Frequency remains appropriate    Co-evaluation                 AM-PAC OT "6 Clicks" Daily Activity     Outcome Measure   Help from another person eating meals?: None Help from another person taking care of personal grooming?: A Little Help from another person toileting, which includes using toliet, bedpan, or urinal?: A Little Help from another person bathing (including washing, rinsing, drying)?: A Little Help from another person to put on and taking off regular upper body clothing?: None Help from another person to put on and taking off regular lower body clothing?: A Lot 6 Click Score: 19    End of Session Equipment Utilized During Treatment: Gait belt;Rolling walker (2 wheels)  OT Visit Diagnosis: Other  abnormalities of gait and mobility (R26.89);Unsteadiness on feet (R26.81);Pain;History of falling (Z91.81) Pain - Right/Left: Left Pain - part of body: Leg   Activity Tolerance Patient tolerated treatment well   Patient Left in bed;with call bell/phone within reach;with nursing/sitter in room   Nurse Communication Mobility status        Time: 0865-7846 OT Time Calculation (min): 37 min  Charges: OT General Charges $OT Visit: 1 Visit OT Treatments $Therapeutic Activity: 23-37 mins  07/08/2022  AB, OTR/L  Acute Rehabilitation Services  Office: [phone]   Tristan Schroeder 07/08/2022, 5:15 PM

## 2022-07-09 LAB — TYPE AND SCREEN
ABO/RH(D): O POS
Antibody Screen: NEGATIVE
Unit division: 0

## 2022-07-09 LAB — BPAM RBC: ISSUE DATE / TIME: 202406260419

## 2022-07-10 DIAGNOSIS — I959 Hypotension, unspecified: Secondary | ICD-10-CM | POA: Diagnosis not present

## 2022-07-10 DIAGNOSIS — D649 Anemia, unspecified: Secondary | ICD-10-CM | POA: Diagnosis not present

## 2022-07-10 DIAGNOSIS — S72142D Displaced intertrochanteric fracture of left femur, subsequent encounter for closed fracture with routine healing: Secondary | ICD-10-CM | POA: Diagnosis not present

## 2022-07-10 DIAGNOSIS — G894 Chronic pain syndrome: Secondary | ICD-10-CM | POA: Diagnosis not present

## 2022-07-10 DIAGNOSIS — K220 Achalasia of cardia: Secondary | ICD-10-CM | POA: Diagnosis not present

## 2022-07-10 DIAGNOSIS — J8410 Pulmonary fibrosis, unspecified: Secondary | ICD-10-CM | POA: Diagnosis not present

## 2022-07-10 DIAGNOSIS — W19XXXD Unspecified fall, subsequent encounter: Secondary | ICD-10-CM | POA: Diagnosis not present

## 2022-07-10 DIAGNOSIS — J449 Chronic obstructive pulmonary disease, unspecified: Secondary | ICD-10-CM | POA: Diagnosis not present

## 2022-07-10 DIAGNOSIS — E43 Unspecified severe protein-calorie malnutrition: Secondary | ICD-10-CM | POA: Diagnosis not present

## 2022-07-14 DIAGNOSIS — G894 Chronic pain syndrome: Secondary | ICD-10-CM | POA: Diagnosis not present

## 2022-07-14 DIAGNOSIS — J449 Chronic obstructive pulmonary disease, unspecified: Secondary | ICD-10-CM | POA: Diagnosis not present

## 2022-07-14 DIAGNOSIS — J8410 Pulmonary fibrosis, unspecified: Secondary | ICD-10-CM | POA: Diagnosis not present

## 2022-07-14 DIAGNOSIS — S72142D Displaced intertrochanteric fracture of left femur, subsequent encounter for closed fracture with routine healing: Secondary | ICD-10-CM | POA: Diagnosis not present

## 2022-07-14 DIAGNOSIS — D649 Anemia, unspecified: Secondary | ICD-10-CM | POA: Diagnosis not present

## 2022-07-14 DIAGNOSIS — K220 Achalasia of cardia: Secondary | ICD-10-CM | POA: Diagnosis not present

## 2022-07-14 DIAGNOSIS — E43 Unspecified severe protein-calorie malnutrition: Secondary | ICD-10-CM | POA: Diagnosis not present

## 2022-07-14 DIAGNOSIS — I959 Hypotension, unspecified: Secondary | ICD-10-CM | POA: Diagnosis not present

## 2022-07-14 DIAGNOSIS — W19XXXD Unspecified fall, subsequent encounter: Secondary | ICD-10-CM | POA: Diagnosis not present

## 2022-07-15 DIAGNOSIS — J449 Chronic obstructive pulmonary disease, unspecified: Secondary | ICD-10-CM | POA: Diagnosis not present

## 2022-07-15 DIAGNOSIS — D649 Anemia, unspecified: Secondary | ICD-10-CM | POA: Diagnosis not present

## 2022-07-15 DIAGNOSIS — K220 Achalasia of cardia: Secondary | ICD-10-CM | POA: Diagnosis not present

## 2022-07-15 DIAGNOSIS — G894 Chronic pain syndrome: Secondary | ICD-10-CM | POA: Diagnosis not present

## 2022-07-15 DIAGNOSIS — W19XXXD Unspecified fall, subsequent encounter: Secondary | ICD-10-CM | POA: Diagnosis not present

## 2022-07-15 DIAGNOSIS — S72142D Displaced intertrochanteric fracture of left femur, subsequent encounter for closed fracture with routine healing: Secondary | ICD-10-CM | POA: Diagnosis not present

## 2022-07-15 DIAGNOSIS — I959 Hypotension, unspecified: Secondary | ICD-10-CM | POA: Diagnosis not present

## 2022-07-15 DIAGNOSIS — J8410 Pulmonary fibrosis, unspecified: Secondary | ICD-10-CM | POA: Diagnosis not present

## 2022-07-15 DIAGNOSIS — E43 Unspecified severe protein-calorie malnutrition: Secondary | ICD-10-CM | POA: Diagnosis not present

## 2022-07-17 DIAGNOSIS — J8410 Pulmonary fibrosis, unspecified: Secondary | ICD-10-CM | POA: Diagnosis not present

## 2022-07-17 DIAGNOSIS — W19XXXD Unspecified fall, subsequent encounter: Secondary | ICD-10-CM | POA: Diagnosis not present

## 2022-07-17 DIAGNOSIS — I959 Hypotension, unspecified: Secondary | ICD-10-CM | POA: Diagnosis not present

## 2022-07-17 DIAGNOSIS — D649 Anemia, unspecified: Secondary | ICD-10-CM | POA: Diagnosis not present

## 2022-07-17 DIAGNOSIS — S72142D Displaced intertrochanteric fracture of left femur, subsequent encounter for closed fracture with routine healing: Secondary | ICD-10-CM | POA: Diagnosis not present

## 2022-07-17 DIAGNOSIS — K220 Achalasia of cardia: Secondary | ICD-10-CM | POA: Diagnosis not present

## 2022-07-17 DIAGNOSIS — E43 Unspecified severe protein-calorie malnutrition: Secondary | ICD-10-CM | POA: Diagnosis not present

## 2022-07-17 DIAGNOSIS — J449 Chronic obstructive pulmonary disease, unspecified: Secondary | ICD-10-CM | POA: Diagnosis not present

## 2022-07-17 DIAGNOSIS — G894 Chronic pain syndrome: Secondary | ICD-10-CM | POA: Diagnosis not present

## 2022-07-20 DIAGNOSIS — D649 Anemia, unspecified: Secondary | ICD-10-CM | POA: Diagnosis not present

## 2022-07-20 DIAGNOSIS — K220 Achalasia of cardia: Secondary | ICD-10-CM | POA: Diagnosis not present

## 2022-07-20 DIAGNOSIS — W19XXXD Unspecified fall, subsequent encounter: Secondary | ICD-10-CM | POA: Diagnosis not present

## 2022-07-20 DIAGNOSIS — G894 Chronic pain syndrome: Secondary | ICD-10-CM | POA: Diagnosis not present

## 2022-07-20 DIAGNOSIS — S72142D Displaced intertrochanteric fracture of left femur, subsequent encounter for closed fracture with routine healing: Secondary | ICD-10-CM | POA: Diagnosis not present

## 2022-07-20 DIAGNOSIS — J8410 Pulmonary fibrosis, unspecified: Secondary | ICD-10-CM | POA: Diagnosis not present

## 2022-07-20 DIAGNOSIS — J449 Chronic obstructive pulmonary disease, unspecified: Secondary | ICD-10-CM | POA: Diagnosis not present

## 2022-07-20 DIAGNOSIS — I959 Hypotension, unspecified: Secondary | ICD-10-CM | POA: Diagnosis not present

## 2022-07-20 DIAGNOSIS — E43 Unspecified severe protein-calorie malnutrition: Secondary | ICD-10-CM | POA: Diagnosis not present

## 2022-07-21 DIAGNOSIS — R109 Unspecified abdominal pain: Secondary | ICD-10-CM | POA: Diagnosis not present

## 2022-07-21 DIAGNOSIS — G894 Chronic pain syndrome: Secondary | ICD-10-CM | POA: Diagnosis not present

## 2022-07-21 DIAGNOSIS — N3010 Interstitial cystitis (chronic) without hematuria: Secondary | ICD-10-CM | POA: Diagnosis not present

## 2022-07-21 DIAGNOSIS — M549 Dorsalgia, unspecified: Secondary | ICD-10-CM | POA: Diagnosis not present

## 2022-07-21 DIAGNOSIS — M25572 Pain in left ankle and joints of left foot: Secondary | ICD-10-CM | POA: Diagnosis not present

## 2022-07-21 DIAGNOSIS — Z79891 Long term (current) use of opiate analgesic: Secondary | ICD-10-CM | POA: Diagnosis not present

## 2022-07-21 DIAGNOSIS — M4310 Spondylolisthesis, site unspecified: Secondary | ICD-10-CM | POA: Diagnosis not present

## 2022-07-21 DIAGNOSIS — S72142D Displaced intertrochanteric fracture of left femur, subsequent encounter for closed fracture with routine healing: Secondary | ICD-10-CM | POA: Diagnosis not present

## 2022-07-21 DIAGNOSIS — Z1389 Encounter for screening for other disorder: Secondary | ICD-10-CM | POA: Diagnosis not present

## 2022-07-22 DIAGNOSIS — J8410 Pulmonary fibrosis, unspecified: Secondary | ICD-10-CM | POA: Diagnosis not present

## 2022-07-22 DIAGNOSIS — G894 Chronic pain syndrome: Secondary | ICD-10-CM | POA: Diagnosis not present

## 2022-07-22 DIAGNOSIS — W19XXXD Unspecified fall, subsequent encounter: Secondary | ICD-10-CM | POA: Diagnosis not present

## 2022-07-22 DIAGNOSIS — D649 Anemia, unspecified: Secondary | ICD-10-CM | POA: Diagnosis not present

## 2022-07-22 DIAGNOSIS — I959 Hypotension, unspecified: Secondary | ICD-10-CM | POA: Diagnosis not present

## 2022-07-22 DIAGNOSIS — E43 Unspecified severe protein-calorie malnutrition: Secondary | ICD-10-CM | POA: Diagnosis not present

## 2022-07-22 DIAGNOSIS — J449 Chronic obstructive pulmonary disease, unspecified: Secondary | ICD-10-CM | POA: Diagnosis not present

## 2022-07-22 DIAGNOSIS — S72142D Displaced intertrochanteric fracture of left femur, subsequent encounter for closed fracture with routine healing: Secondary | ICD-10-CM | POA: Diagnosis not present

## 2022-07-22 DIAGNOSIS — K220 Achalasia of cardia: Secondary | ICD-10-CM | POA: Diagnosis not present

## 2022-07-23 DIAGNOSIS — G894 Chronic pain syndrome: Secondary | ICD-10-CM | POA: Diagnosis not present

## 2022-07-23 DIAGNOSIS — D649 Anemia, unspecified: Secondary | ICD-10-CM | POA: Diagnosis not present

## 2022-07-23 DIAGNOSIS — J8410 Pulmonary fibrosis, unspecified: Secondary | ICD-10-CM | POA: Diagnosis not present

## 2022-07-23 DIAGNOSIS — E43 Unspecified severe protein-calorie malnutrition: Secondary | ICD-10-CM | POA: Diagnosis not present

## 2022-07-23 DIAGNOSIS — K220 Achalasia of cardia: Secondary | ICD-10-CM | POA: Diagnosis not present

## 2022-07-23 DIAGNOSIS — W19XXXD Unspecified fall, subsequent encounter: Secondary | ICD-10-CM | POA: Diagnosis not present

## 2022-07-23 DIAGNOSIS — J449 Chronic obstructive pulmonary disease, unspecified: Secondary | ICD-10-CM | POA: Diagnosis not present

## 2022-07-23 DIAGNOSIS — I959 Hypotension, unspecified: Secondary | ICD-10-CM | POA: Diagnosis not present

## 2022-07-23 DIAGNOSIS — S72142D Displaced intertrochanteric fracture of left femur, subsequent encounter for closed fracture with routine healing: Secondary | ICD-10-CM | POA: Diagnosis not present

## 2022-07-27 DIAGNOSIS — E43 Unspecified severe protein-calorie malnutrition: Secondary | ICD-10-CM | POA: Diagnosis not present

## 2022-07-27 DIAGNOSIS — S72142D Displaced intertrochanteric fracture of left femur, subsequent encounter for closed fracture with routine healing: Secondary | ICD-10-CM | POA: Diagnosis not present

## 2022-07-27 DIAGNOSIS — D649 Anemia, unspecified: Secondary | ICD-10-CM | POA: Diagnosis not present

## 2022-07-27 DIAGNOSIS — K220 Achalasia of cardia: Secondary | ICD-10-CM | POA: Diagnosis not present

## 2022-07-27 DIAGNOSIS — G894 Chronic pain syndrome: Secondary | ICD-10-CM | POA: Diagnosis not present

## 2022-07-27 DIAGNOSIS — I959 Hypotension, unspecified: Secondary | ICD-10-CM | POA: Diagnosis not present

## 2022-07-27 DIAGNOSIS — W19XXXD Unspecified fall, subsequent encounter: Secondary | ICD-10-CM | POA: Diagnosis not present

## 2022-07-27 DIAGNOSIS — J449 Chronic obstructive pulmonary disease, unspecified: Secondary | ICD-10-CM | POA: Diagnosis not present

## 2022-07-27 DIAGNOSIS — J8410 Pulmonary fibrosis, unspecified: Secondary | ICD-10-CM | POA: Diagnosis not present

## 2022-07-29 DIAGNOSIS — J8410 Pulmonary fibrosis, unspecified: Secondary | ICD-10-CM | POA: Diagnosis not present

## 2022-07-29 DIAGNOSIS — I959 Hypotension, unspecified: Secondary | ICD-10-CM | POA: Diagnosis not present

## 2022-07-29 DIAGNOSIS — E43 Unspecified severe protein-calorie malnutrition: Secondary | ICD-10-CM | POA: Diagnosis not present

## 2022-07-29 DIAGNOSIS — W19XXXD Unspecified fall, subsequent encounter: Secondary | ICD-10-CM | POA: Diagnosis not present

## 2022-07-29 DIAGNOSIS — S72142D Displaced intertrochanteric fracture of left femur, subsequent encounter for closed fracture with routine healing: Secondary | ICD-10-CM | POA: Diagnosis not present

## 2022-07-29 DIAGNOSIS — K220 Achalasia of cardia: Secondary | ICD-10-CM | POA: Diagnosis not present

## 2022-07-29 DIAGNOSIS — G894 Chronic pain syndrome: Secondary | ICD-10-CM | POA: Diagnosis not present

## 2022-07-29 DIAGNOSIS — J449 Chronic obstructive pulmonary disease, unspecified: Secondary | ICD-10-CM | POA: Diagnosis not present

## 2022-07-29 DIAGNOSIS — D649 Anemia, unspecified: Secondary | ICD-10-CM | POA: Diagnosis not present

## 2022-08-05 DIAGNOSIS — W19XXXD Unspecified fall, subsequent encounter: Secondary | ICD-10-CM | POA: Diagnosis not present

## 2022-08-05 DIAGNOSIS — I959 Hypotension, unspecified: Secondary | ICD-10-CM | POA: Diagnosis not present

## 2022-08-05 DIAGNOSIS — G894 Chronic pain syndrome: Secondary | ICD-10-CM | POA: Diagnosis not present

## 2022-08-05 DIAGNOSIS — J449 Chronic obstructive pulmonary disease, unspecified: Secondary | ICD-10-CM | POA: Diagnosis not present

## 2022-08-05 DIAGNOSIS — K220 Achalasia of cardia: Secondary | ICD-10-CM | POA: Diagnosis not present

## 2022-08-05 DIAGNOSIS — E43 Unspecified severe protein-calorie malnutrition: Secondary | ICD-10-CM | POA: Diagnosis not present

## 2022-08-05 DIAGNOSIS — D649 Anemia, unspecified: Secondary | ICD-10-CM | POA: Diagnosis not present

## 2022-08-05 DIAGNOSIS — J8410 Pulmonary fibrosis, unspecified: Secondary | ICD-10-CM | POA: Diagnosis not present

## 2022-08-05 DIAGNOSIS — S72142D Displaced intertrochanteric fracture of left femur, subsequent encounter for closed fracture with routine healing: Secondary | ICD-10-CM | POA: Diagnosis not present

## 2022-08-12 DIAGNOSIS — J8410 Pulmonary fibrosis, unspecified: Secondary | ICD-10-CM | POA: Diagnosis not present

## 2022-08-12 DIAGNOSIS — K220 Achalasia of cardia: Secondary | ICD-10-CM | POA: Diagnosis not present

## 2022-08-12 DIAGNOSIS — N3010 Interstitial cystitis (chronic) without hematuria: Secondary | ICD-10-CM | POA: Diagnosis not present

## 2022-08-12 DIAGNOSIS — J449 Chronic obstructive pulmonary disease, unspecified: Secondary | ICD-10-CM | POA: Diagnosis not present

## 2022-08-12 DIAGNOSIS — G894 Chronic pain syndrome: Secondary | ICD-10-CM | POA: Diagnosis not present

## 2022-08-12 DIAGNOSIS — S72142D Displaced intertrochanteric fracture of left femur, subsequent encounter for closed fracture with routine healing: Secondary | ICD-10-CM | POA: Diagnosis not present

## 2022-08-12 DIAGNOSIS — W19XXXD Unspecified fall, subsequent encounter: Secondary | ICD-10-CM | POA: Diagnosis not present

## 2022-08-12 DIAGNOSIS — K582 Mixed irritable bowel syndrome: Secondary | ICD-10-CM | POA: Diagnosis not present

## 2022-08-12 DIAGNOSIS — E43 Unspecified severe protein-calorie malnutrition: Secondary | ICD-10-CM | POA: Diagnosis not present

## 2022-08-12 DIAGNOSIS — I959 Hypotension, unspecified: Secondary | ICD-10-CM | POA: Diagnosis not present

## 2022-08-12 DIAGNOSIS — D649 Anemia, unspecified: Secondary | ICD-10-CM | POA: Diagnosis not present

## 2022-08-12 DIAGNOSIS — M6289 Other specified disorders of muscle: Secondary | ICD-10-CM | POA: Diagnosis not present

## 2022-08-14 DIAGNOSIS — Z8781 Personal history of (healed) traumatic fracture: Secondary | ICD-10-CM | POA: Diagnosis not present

## 2022-08-14 DIAGNOSIS — K259 Gastric ulcer, unspecified as acute or chronic, without hemorrhage or perforation: Secondary | ICD-10-CM | POA: Diagnosis not present

## 2022-08-14 DIAGNOSIS — D509 Iron deficiency anemia, unspecified: Secondary | ICD-10-CM | POA: Diagnosis not present

## 2022-08-14 DIAGNOSIS — E43 Unspecified severe protein-calorie malnutrition: Secondary | ICD-10-CM | POA: Diagnosis not present

## 2022-08-18 DIAGNOSIS — S72142D Displaced intertrochanteric fracture of left femur, subsequent encounter for closed fracture with routine healing: Secondary | ICD-10-CM | POA: Diagnosis not present

## 2022-08-19 DIAGNOSIS — K220 Achalasia of cardia: Secondary | ICD-10-CM | POA: Diagnosis not present

## 2022-08-19 DIAGNOSIS — J8410 Pulmonary fibrosis, unspecified: Secondary | ICD-10-CM | POA: Diagnosis not present

## 2022-08-19 DIAGNOSIS — E43 Unspecified severe protein-calorie malnutrition: Secondary | ICD-10-CM | POA: Diagnosis not present

## 2022-08-19 DIAGNOSIS — W19XXXD Unspecified fall, subsequent encounter: Secondary | ICD-10-CM | POA: Diagnosis not present

## 2022-08-19 DIAGNOSIS — S72142D Displaced intertrochanteric fracture of left femur, subsequent encounter for closed fracture with routine healing: Secondary | ICD-10-CM | POA: Diagnosis not present

## 2022-08-19 DIAGNOSIS — J449 Chronic obstructive pulmonary disease, unspecified: Secondary | ICD-10-CM | POA: Diagnosis not present

## 2022-08-19 DIAGNOSIS — I959 Hypotension, unspecified: Secondary | ICD-10-CM | POA: Diagnosis not present

## 2022-08-19 DIAGNOSIS — G894 Chronic pain syndrome: Secondary | ICD-10-CM | POA: Diagnosis not present

## 2022-08-19 DIAGNOSIS — D649 Anemia, unspecified: Secondary | ICD-10-CM | POA: Diagnosis not present

## 2022-08-20 DIAGNOSIS — K2289 Other specified disease of esophagus: Secondary | ICD-10-CM | POA: Diagnosis not present

## 2022-08-20 DIAGNOSIS — R64 Cachexia: Secondary | ICD-10-CM | POA: Diagnosis not present

## 2022-08-20 DIAGNOSIS — I700 Atherosclerosis of aorta: Secondary | ICD-10-CM | POA: Diagnosis not present

## 2022-08-20 DIAGNOSIS — J439 Emphysema, unspecified: Secondary | ICD-10-CM | POA: Diagnosis not present

## 2022-08-20 DIAGNOSIS — J432 Centrilobular emphysema: Secondary | ICD-10-CM | POA: Diagnosis not present

## 2022-08-20 DIAGNOSIS — R918 Other nonspecific abnormal finding of lung field: Secondary | ICD-10-CM | POA: Diagnosis not present

## 2022-08-20 DIAGNOSIS — R911 Solitary pulmonary nodule: Secondary | ICD-10-CM | POA: Diagnosis not present

## 2022-08-24 DIAGNOSIS — Z79891 Long term (current) use of opiate analgesic: Secondary | ICD-10-CM | POA: Diagnosis not present

## 2022-08-24 DIAGNOSIS — N3010 Interstitial cystitis (chronic) without hematuria: Secondary | ICD-10-CM | POA: Diagnosis not present

## 2022-08-24 DIAGNOSIS — R109 Unspecified abdominal pain: Secondary | ICD-10-CM | POA: Diagnosis not present

## 2022-08-24 DIAGNOSIS — S32000A Wedge compression fracture of unspecified lumbar vertebra, initial encounter for closed fracture: Secondary | ICD-10-CM | POA: Diagnosis not present

## 2022-08-24 DIAGNOSIS — M4310 Spondylolisthesis, site unspecified: Secondary | ICD-10-CM | POA: Diagnosis not present

## 2022-08-24 DIAGNOSIS — G894 Chronic pain syndrome: Secondary | ICD-10-CM | POA: Diagnosis not present

## 2022-08-25 DIAGNOSIS — W19XXXD Unspecified fall, subsequent encounter: Secondary | ICD-10-CM | POA: Diagnosis not present

## 2022-08-25 DIAGNOSIS — K220 Achalasia of cardia: Secondary | ICD-10-CM | POA: Diagnosis not present

## 2022-08-25 DIAGNOSIS — D649 Anemia, unspecified: Secondary | ICD-10-CM | POA: Diagnosis not present

## 2022-08-25 DIAGNOSIS — S72142D Displaced intertrochanteric fracture of left femur, subsequent encounter for closed fracture with routine healing: Secondary | ICD-10-CM | POA: Diagnosis not present

## 2022-08-25 DIAGNOSIS — G894 Chronic pain syndrome: Secondary | ICD-10-CM | POA: Diagnosis not present

## 2022-08-25 DIAGNOSIS — J8410 Pulmonary fibrosis, unspecified: Secondary | ICD-10-CM | POA: Diagnosis not present

## 2022-08-25 DIAGNOSIS — E43 Unspecified severe protein-calorie malnutrition: Secondary | ICD-10-CM | POA: Diagnosis not present

## 2022-08-25 DIAGNOSIS — I959 Hypotension, unspecified: Secondary | ICD-10-CM | POA: Diagnosis not present

## 2022-08-25 DIAGNOSIS — J449 Chronic obstructive pulmonary disease, unspecified: Secondary | ICD-10-CM | POA: Diagnosis not present

## 2022-08-27 DIAGNOSIS — R911 Solitary pulmonary nodule: Secondary | ICD-10-CM | POA: Diagnosis not present

## 2022-08-31 DIAGNOSIS — I959 Hypotension, unspecified: Secondary | ICD-10-CM | POA: Diagnosis not present

## 2022-08-31 DIAGNOSIS — W19XXXD Unspecified fall, subsequent encounter: Secondary | ICD-10-CM | POA: Diagnosis not present

## 2022-08-31 DIAGNOSIS — K220 Achalasia of cardia: Secondary | ICD-10-CM | POA: Diagnosis not present

## 2022-08-31 DIAGNOSIS — J449 Chronic obstructive pulmonary disease, unspecified: Secondary | ICD-10-CM | POA: Diagnosis not present

## 2022-08-31 DIAGNOSIS — D649 Anemia, unspecified: Secondary | ICD-10-CM | POA: Diagnosis not present

## 2022-08-31 DIAGNOSIS — G894 Chronic pain syndrome: Secondary | ICD-10-CM | POA: Diagnosis not present

## 2022-08-31 DIAGNOSIS — J8410 Pulmonary fibrosis, unspecified: Secondary | ICD-10-CM | POA: Diagnosis not present

## 2022-08-31 DIAGNOSIS — S72142D Displaced intertrochanteric fracture of left femur, subsequent encounter for closed fracture with routine healing: Secondary | ICD-10-CM | POA: Diagnosis not present

## 2022-08-31 DIAGNOSIS — E43 Unspecified severe protein-calorie malnutrition: Secondary | ICD-10-CM | POA: Diagnosis not present

## 2022-09-03 DIAGNOSIS — W19XXXD Unspecified fall, subsequent encounter: Secondary | ICD-10-CM | POA: Diagnosis not present

## 2022-09-03 DIAGNOSIS — J8410 Pulmonary fibrosis, unspecified: Secondary | ICD-10-CM | POA: Diagnosis not present

## 2022-09-03 DIAGNOSIS — D649 Anemia, unspecified: Secondary | ICD-10-CM | POA: Diagnosis not present

## 2022-09-03 DIAGNOSIS — E43 Unspecified severe protein-calorie malnutrition: Secondary | ICD-10-CM | POA: Diagnosis not present

## 2022-09-03 DIAGNOSIS — K220 Achalasia of cardia: Secondary | ICD-10-CM | POA: Diagnosis not present

## 2022-09-03 DIAGNOSIS — I959 Hypotension, unspecified: Secondary | ICD-10-CM | POA: Diagnosis not present

## 2022-09-03 DIAGNOSIS — S72142D Displaced intertrochanteric fracture of left femur, subsequent encounter for closed fracture with routine healing: Secondary | ICD-10-CM | POA: Diagnosis not present

## 2022-09-03 DIAGNOSIS — G894 Chronic pain syndrome: Secondary | ICD-10-CM | POA: Diagnosis not present

## 2022-09-03 DIAGNOSIS — J449 Chronic obstructive pulmonary disease, unspecified: Secondary | ICD-10-CM | POA: Diagnosis not present

## 2022-09-16 DIAGNOSIS — L97519 Non-pressure chronic ulcer of other part of right foot with unspecified severity: Secondary | ICD-10-CM | POA: Diagnosis not present

## 2022-09-22 DIAGNOSIS — Z1389 Encounter for screening for other disorder: Secondary | ICD-10-CM | POA: Diagnosis not present

## 2022-09-22 DIAGNOSIS — M4310 Spondylolisthesis, site unspecified: Secondary | ICD-10-CM | POA: Diagnosis not present

## 2022-09-22 DIAGNOSIS — S72142D Displaced intertrochanteric fracture of left femur, subsequent encounter for closed fracture with routine healing: Secondary | ICD-10-CM | POA: Diagnosis not present

## 2022-09-22 DIAGNOSIS — G894 Chronic pain syndrome: Secondary | ICD-10-CM | POA: Diagnosis not present

## 2022-09-22 DIAGNOSIS — N3010 Interstitial cystitis (chronic) without hematuria: Secondary | ICD-10-CM | POA: Diagnosis not present

## 2022-09-22 DIAGNOSIS — R109 Unspecified abdominal pain: Secondary | ICD-10-CM | POA: Diagnosis not present

## 2022-09-22 DIAGNOSIS — Z79891 Long term (current) use of opiate analgesic: Secondary | ICD-10-CM | POA: Diagnosis not present

## 2022-09-22 DIAGNOSIS — M549 Dorsalgia, unspecified: Secondary | ICD-10-CM | POA: Diagnosis not present

## 2022-09-22 DIAGNOSIS — S32000A Wedge compression fracture of unspecified lumbar vertebra, initial encounter for closed fracture: Secondary | ICD-10-CM | POA: Diagnosis not present

## 2022-09-24 ENCOUNTER — Telehealth: Payer: Self-pay | Admitting: Internal Medicine

## 2022-09-24 NOTE — Telephone Encounter (Signed)
Good afternoon Dr. Rhea Belton   The following patient was under your care a year ago and then decided to see [facility] for a second opinion and did not enjoy her experience. She is needing to be seen because she is down to 60 pounds and that her stomach is just not working properly. She's afraid whatever is going on is going to make her die. Her records with [facility] are available with care everywhere. Please review and advise of scheduling

## 2022-09-28 NOTE — Telephone Encounter (Signed)
I know Charnel well While I am not opposed to seeing her I truly believe she would be better served by remaining with a tertiary care center like [facility]  There she has assess to more resources including their surgical team given she will probably need her GJ tube replaced I think is the best for her at this time

## 2022-10-13 DIAGNOSIS — E43 Unspecified severe protein-calorie malnutrition: Secondary | ICD-10-CM | POA: Diagnosis not present

## 2022-10-13 DIAGNOSIS — K3184 Gastroparesis: Secondary | ICD-10-CM | POA: Diagnosis not present

## 2022-10-14 DIAGNOSIS — L97519 Non-pressure chronic ulcer of other part of right foot with unspecified severity: Secondary | ICD-10-CM | POA: Diagnosis not present

## 2022-10-21 DIAGNOSIS — Z79899 Other long term (current) drug therapy: Secondary | ICD-10-CM | POA: Diagnosis not present

## 2022-10-21 DIAGNOSIS — Z681 Body mass index (BMI) 19 or less, adult: Secondary | ICD-10-CM | POA: Diagnosis not present

## 2022-10-21 DIAGNOSIS — Z66 Do not resuscitate: Secondary | ICD-10-CM | POA: Diagnosis not present

## 2022-10-21 DIAGNOSIS — K3184 Gastroparesis: Secondary | ICD-10-CM | POA: Diagnosis not present

## 2022-10-21 DIAGNOSIS — I2510 Atherosclerotic heart disease of native coronary artery without angina pectoris: Secondary | ICD-10-CM | POA: Diagnosis not present

## 2022-10-21 DIAGNOSIS — E039 Hypothyroidism, unspecified: Secondary | ICD-10-CM | POA: Diagnosis not present

## 2022-10-21 DIAGNOSIS — J449 Chronic obstructive pulmonary disease, unspecified: Secondary | ICD-10-CM | POA: Diagnosis not present

## 2022-10-21 DIAGNOSIS — R64 Cachexia: Secondary | ICD-10-CM | POA: Diagnosis not present

## 2022-10-21 DIAGNOSIS — Z903 Acquired absence of stomach [part of]: Secondary | ICD-10-CM | POA: Diagnosis not present

## 2022-10-21 DIAGNOSIS — G8929 Other chronic pain: Secondary | ICD-10-CM | POA: Diagnosis not present

## 2022-10-21 DIAGNOSIS — R627 Adult failure to thrive: Secondary | ICD-10-CM | POA: Diagnosis not present

## 2022-10-21 DIAGNOSIS — F17210 Nicotine dependence, cigarettes, uncomplicated: Secondary | ICD-10-CM | POA: Diagnosis not present

## 2022-10-21 DIAGNOSIS — K219 Gastro-esophageal reflux disease without esophagitis: Secondary | ICD-10-CM | POA: Diagnosis not present

## 2022-10-22 DIAGNOSIS — R64 Cachexia: Secondary | ICD-10-CM | POA: Diagnosis not present

## 2022-10-22 DIAGNOSIS — R627 Adult failure to thrive: Secondary | ICD-10-CM | POA: Diagnosis not present

## 2022-10-22 DIAGNOSIS — E039 Hypothyroidism, unspecified: Secondary | ICD-10-CM | POA: Diagnosis not present

## 2022-10-22 DIAGNOSIS — I2510 Atherosclerotic heart disease of native coronary artery without angina pectoris: Secondary | ICD-10-CM | POA: Diagnosis not present

## 2022-10-22 DIAGNOSIS — F17210 Nicotine dependence, cigarettes, uncomplicated: Secondary | ICD-10-CM | POA: Diagnosis not present

## 2022-10-22 DIAGNOSIS — Z681 Body mass index (BMI) 19 or less, adult: Secondary | ICD-10-CM | POA: Diagnosis not present

## 2022-10-22 DIAGNOSIS — K219 Gastro-esophageal reflux disease without esophagitis: Secondary | ICD-10-CM | POA: Diagnosis not present

## 2022-10-22 DIAGNOSIS — G8929 Other chronic pain: Secondary | ICD-10-CM | POA: Diagnosis not present

## 2022-10-22 DIAGNOSIS — Z66 Do not resuscitate: Secondary | ICD-10-CM | POA: Diagnosis not present

## 2022-10-22 DIAGNOSIS — J449 Chronic obstructive pulmonary disease, unspecified: Secondary | ICD-10-CM | POA: Diagnosis not present

## 2022-10-22 DIAGNOSIS — K589 Irritable bowel syndrome without diarrhea: Secondary | ICD-10-CM | POA: Diagnosis not present

## 2022-10-22 DIAGNOSIS — Z903 Acquired absence of stomach [part of]: Secondary | ICD-10-CM | POA: Diagnosis not present

## 2022-10-22 DIAGNOSIS — Z79899 Other long term (current) drug therapy: Secondary | ICD-10-CM | POA: Diagnosis not present

## 2022-10-22 DIAGNOSIS — K3184 Gastroparesis: Secondary | ICD-10-CM | POA: Diagnosis not present

## 2022-11-03 DIAGNOSIS — E43 Unspecified severe protein-calorie malnutrition: Secondary | ICD-10-CM | POA: Diagnosis not present

## 2022-11-03 DIAGNOSIS — K219 Gastro-esophageal reflux disease without esophagitis: Secondary | ICD-10-CM | POA: Diagnosis not present

## 2022-11-17 DIAGNOSIS — L97519 Non-pressure chronic ulcer of other part of right foot with unspecified severity: Secondary | ICD-10-CM | POA: Diagnosis not present

## 2022-11-17 DIAGNOSIS — E43 Unspecified severe protein-calorie malnutrition: Secondary | ICD-10-CM | POA: Diagnosis not present

## 2022-11-25 DIAGNOSIS — Z79891 Long term (current) use of opiate analgesic: Secondary | ICD-10-CM | POA: Diagnosis not present

## 2022-11-25 DIAGNOSIS — R109 Unspecified abdominal pain: Secondary | ICD-10-CM | POA: Diagnosis not present

## 2022-11-25 DIAGNOSIS — M4310 Spondylolisthesis, site unspecified: Secondary | ICD-10-CM | POA: Diagnosis not present

## 2022-11-25 DIAGNOSIS — G894 Chronic pain syndrome: Secondary | ICD-10-CM | POA: Diagnosis not present

## 2022-11-25 DIAGNOSIS — M549 Dorsalgia, unspecified: Secondary | ICD-10-CM | POA: Diagnosis not present

## 2022-11-25 DIAGNOSIS — N3010 Interstitial cystitis (chronic) without hematuria: Secondary | ICD-10-CM | POA: Diagnosis not present

## 2022-11-26 DIAGNOSIS — L97512 Non-pressure chronic ulcer of other part of right foot with fat layer exposed: Secondary | ICD-10-CM | POA: Diagnosis not present

## 2022-11-26 DIAGNOSIS — M869 Osteomyelitis, unspecified: Secondary | ICD-10-CM | POA: Diagnosis not present

## 2022-11-26 DIAGNOSIS — Z131 Encounter for screening for diabetes mellitus: Secondary | ICD-10-CM | POA: Diagnosis not present

## 2022-11-26 DIAGNOSIS — L97509 Non-pressure chronic ulcer of other part of unspecified foot with unspecified severity: Secondary | ICD-10-CM | POA: Diagnosis not present

## 2022-11-27 DIAGNOSIS — Z79899 Other long term (current) drug therapy: Secondary | ICD-10-CM | POA: Diagnosis not present

## 2022-11-27 DIAGNOSIS — Z681 Body mass index (BMI) 19 or less, adult: Secondary | ICD-10-CM | POA: Diagnosis not present

## 2022-11-27 DIAGNOSIS — Z7982 Long term (current) use of aspirin: Secondary | ICD-10-CM | POA: Diagnosis not present

## 2022-11-27 DIAGNOSIS — K311 Adult hypertrophic pyloric stenosis: Secondary | ICD-10-CM | POA: Diagnosis not present

## 2022-11-27 DIAGNOSIS — J449 Chronic obstructive pulmonary disease, unspecified: Secondary | ICD-10-CM | POA: Diagnosis not present

## 2022-11-27 DIAGNOSIS — I10 Essential (primary) hypertension: Secondary | ICD-10-CM | POA: Diagnosis not present

## 2022-11-27 DIAGNOSIS — K219 Gastro-esophageal reflux disease without esophagitis: Secondary | ICD-10-CM | POA: Diagnosis not present

## 2022-11-27 DIAGNOSIS — Z903 Acquired absence of stomach [part of]: Secondary | ICD-10-CM | POA: Diagnosis not present

## 2022-11-27 DIAGNOSIS — K3189 Other diseases of stomach and duodenum: Secondary | ICD-10-CM | POA: Diagnosis not present

## 2022-11-27 DIAGNOSIS — Z5331 Laparoscopic surgical procedure converted to open procedure: Secondary | ICD-10-CM | POA: Diagnosis not present

## 2022-11-27 DIAGNOSIS — E039 Hypothyroidism, unspecified: Secondary | ICD-10-CM | POA: Diagnosis not present

## 2022-11-27 DIAGNOSIS — R627 Adult failure to thrive: Secondary | ICD-10-CM | POA: Diagnosis not present

## 2022-11-27 DIAGNOSIS — J8410 Pulmonary fibrosis, unspecified: Secondary | ICD-10-CM | POA: Diagnosis not present

## 2022-11-27 DIAGNOSIS — E43 Unspecified severe protein-calorie malnutrition: Secondary | ICD-10-CM | POA: Diagnosis not present

## 2022-11-27 DIAGNOSIS — F17210 Nicotine dependence, cigarettes, uncomplicated: Secondary | ICD-10-CM | POA: Diagnosis not present

## 2022-12-08 DIAGNOSIS — E878 Other disorders of electrolyte and fluid balance, not elsewhere classified: Secondary | ICD-10-CM | POA: Diagnosis not present

## 2022-12-08 DIAGNOSIS — K219 Gastro-esophageal reflux disease without esophagitis: Secondary | ICD-10-CM | POA: Diagnosis not present

## 2022-12-08 DIAGNOSIS — Z8711 Personal history of peptic ulcer disease: Secondary | ICD-10-CM | POA: Diagnosis not present

## 2022-12-08 DIAGNOSIS — J449 Chronic obstructive pulmonary disease, unspecified: Secondary | ICD-10-CM | POA: Diagnosis not present

## 2022-12-08 DIAGNOSIS — R633 Feeding difficulties, unspecified: Secondary | ICD-10-CM | POA: Diagnosis not present

## 2022-12-08 DIAGNOSIS — Z79899 Other long term (current) drug therapy: Secondary | ICD-10-CM | POA: Diagnosis not present

## 2022-12-08 DIAGNOSIS — J8410 Pulmonary fibrosis, unspecified: Secondary | ICD-10-CM | POA: Diagnosis not present

## 2022-12-08 DIAGNOSIS — Z431 Encounter for attention to gastrostomy: Secondary | ICD-10-CM | POA: Diagnosis not present

## 2022-12-08 DIAGNOSIS — E039 Hypothyroidism, unspecified: Secondary | ICD-10-CM | POA: Diagnosis not present

## 2022-12-08 DIAGNOSIS — F17210 Nicotine dependence, cigarettes, uncomplicated: Secondary | ICD-10-CM | POA: Diagnosis not present

## 2022-12-08 DIAGNOSIS — K589 Irritable bowel syndrome without diarrhea: Secondary | ICD-10-CM | POA: Diagnosis not present

## 2022-12-08 DIAGNOSIS — L97519 Non-pressure chronic ulcer of other part of right foot with unspecified severity: Secondary | ICD-10-CM | POA: Diagnosis not present

## 2022-12-08 DIAGNOSIS — E43 Unspecified severe protein-calorie malnutrition: Secondary | ICD-10-CM | POA: Diagnosis not present

## 2022-12-08 DIAGNOSIS — K3184 Gastroparesis: Secondary | ICD-10-CM | POA: Diagnosis not present

## 2022-12-08 DIAGNOSIS — K311 Adult hypertrophic pyloric stenosis: Secondary | ICD-10-CM | POA: Diagnosis not present

## 2022-12-09 DIAGNOSIS — Z431 Encounter for attention to gastrostomy: Secondary | ICD-10-CM | POA: Diagnosis not present

## 2022-12-09 DIAGNOSIS — E039 Hypothyroidism, unspecified: Secondary | ICD-10-CM | POA: Diagnosis not present

## 2022-12-09 DIAGNOSIS — M21611 Bunion of right foot: Secondary | ICD-10-CM | POA: Diagnosis not present

## 2022-12-09 DIAGNOSIS — J8410 Pulmonary fibrosis, unspecified: Secondary | ICD-10-CM | POA: Diagnosis not present

## 2022-12-09 DIAGNOSIS — K589 Irritable bowel syndrome without diarrhea: Secondary | ICD-10-CM | POA: Diagnosis not present

## 2022-12-09 DIAGNOSIS — E43 Unspecified severe protein-calorie malnutrition: Secondary | ICD-10-CM | POA: Diagnosis not present

## 2022-12-09 DIAGNOSIS — Z8711 Personal history of peptic ulcer disease: Secondary | ICD-10-CM | POA: Diagnosis not present

## 2022-12-09 DIAGNOSIS — L97512 Non-pressure chronic ulcer of other part of right foot with fat layer exposed: Secondary | ICD-10-CM | POA: Diagnosis not present

## 2022-12-09 DIAGNOSIS — J449 Chronic obstructive pulmonary disease, unspecified: Secondary | ICD-10-CM | POA: Diagnosis not present

## 2022-12-09 DIAGNOSIS — K311 Adult hypertrophic pyloric stenosis: Secondary | ICD-10-CM | POA: Diagnosis not present

## 2022-12-09 DIAGNOSIS — S91109A Unspecified open wound of unspecified toe(s) without damage to nail, initial encounter: Secondary | ICD-10-CM | POA: Diagnosis not present

## 2022-12-09 DIAGNOSIS — M2061 Acquired deformities of toe(s), unspecified, right foot: Secondary | ICD-10-CM | POA: Diagnosis not present

## 2022-12-09 DIAGNOSIS — K3184 Gastroparesis: Secondary | ICD-10-CM | POA: Diagnosis not present

## 2022-12-09 DIAGNOSIS — K219 Gastro-esophageal reflux disease without esophagitis: Secondary | ICD-10-CM | POA: Diagnosis not present

## 2022-12-09 DIAGNOSIS — L97519 Non-pressure chronic ulcer of other part of right foot with unspecified severity: Secondary | ICD-10-CM | POA: Diagnosis not present

## 2022-12-09 DIAGNOSIS — E878 Other disorders of electrolyte and fluid balance, not elsewhere classified: Secondary | ICD-10-CM | POA: Diagnosis not present

## 2022-12-09 DIAGNOSIS — F17210 Nicotine dependence, cigarettes, uncomplicated: Secondary | ICD-10-CM | POA: Diagnosis not present

## 2022-12-09 DIAGNOSIS — E46 Unspecified protein-calorie malnutrition: Secondary | ICD-10-CM | POA: Diagnosis not present

## 2022-12-09 DIAGNOSIS — Z79899 Other long term (current) drug therapy: Secondary | ICD-10-CM | POA: Diagnosis not present

## 2022-12-09 DIAGNOSIS — M79674 Pain in right toe(s): Secondary | ICD-10-CM | POA: Diagnosis not present

## 2022-12-09 DIAGNOSIS — M7989 Other specified soft tissue disorders: Secondary | ICD-10-CM | POA: Diagnosis not present

## 2022-12-13 DIAGNOSIS — K3184 Gastroparesis: Secondary | ICD-10-CM | POA: Diagnosis not present

## 2022-12-15 DIAGNOSIS — K3184 Gastroparesis: Secondary | ICD-10-CM | POA: Diagnosis not present

## 2022-12-18 DIAGNOSIS — L97519 Non-pressure chronic ulcer of other part of right foot with unspecified severity: Secondary | ICD-10-CM | POA: Diagnosis not present

## 2022-12-18 DIAGNOSIS — M869 Osteomyelitis, unspecified: Secondary | ICD-10-CM | POA: Diagnosis not present

## 2022-12-18 DIAGNOSIS — L97514 Non-pressure chronic ulcer of other part of right foot with necrosis of bone: Secondary | ICD-10-CM | POA: Diagnosis not present

## 2022-12-22 DIAGNOSIS — M869 Osteomyelitis, unspecified: Secondary | ICD-10-CM | POA: Diagnosis not present

## 2022-12-22 DIAGNOSIS — F17210 Nicotine dependence, cigarettes, uncomplicated: Secondary | ICD-10-CM | POA: Diagnosis not present

## 2022-12-22 DIAGNOSIS — M86171 Other acute osteomyelitis, right ankle and foot: Secondary | ICD-10-CM | POA: Diagnosis not present

## 2022-12-24 DIAGNOSIS — K3184 Gastroparesis: Secondary | ICD-10-CM | POA: Diagnosis not present

## 2022-12-29 DIAGNOSIS — R109 Unspecified abdominal pain: Secondary | ICD-10-CM | POA: Diagnosis not present

## 2022-12-29 DIAGNOSIS — M549 Dorsalgia, unspecified: Secondary | ICD-10-CM | POA: Diagnosis not present

## 2022-12-29 DIAGNOSIS — Z79891 Long term (current) use of opiate analgesic: Secondary | ICD-10-CM | POA: Diagnosis not present

## 2022-12-29 DIAGNOSIS — N3010 Interstitial cystitis (chronic) without hematuria: Secondary | ICD-10-CM | POA: Diagnosis not present

## 2022-12-29 DIAGNOSIS — M4310 Spondylolisthesis, site unspecified: Secondary | ICD-10-CM | POA: Diagnosis not present

## 2022-12-29 DIAGNOSIS — G894 Chronic pain syndrome: Secondary | ICD-10-CM | POA: Diagnosis not present

## 2022-12-31 DIAGNOSIS — M869 Osteomyelitis, unspecified: Secondary | ICD-10-CM | POA: Diagnosis not present

## 2022-12-31 DIAGNOSIS — L97514 Non-pressure chronic ulcer of other part of right foot with necrosis of bone: Secondary | ICD-10-CM | POA: Diagnosis not present

## 2022-12-31 DIAGNOSIS — L97519 Non-pressure chronic ulcer of other part of right foot with unspecified severity: Secondary | ICD-10-CM | POA: Diagnosis not present

## 2023-01-02 DIAGNOSIS — K3184 Gastroparesis: Secondary | ICD-10-CM | POA: Diagnosis not present

## 2023-01-02 DIAGNOSIS — L97519 Non-pressure chronic ulcer of other part of right foot with unspecified severity: Secondary | ICD-10-CM | POA: Diagnosis not present

## 2023-01-04 DIAGNOSIS — M79671 Pain in right foot: Secondary | ICD-10-CM | POA: Diagnosis not present

## 2023-01-04 DIAGNOSIS — M869 Osteomyelitis, unspecified: Secondary | ICD-10-CM | POA: Diagnosis not present

## 2023-01-04 DIAGNOSIS — R0989 Other specified symptoms and signs involving the circulatory and respiratory systems: Secondary | ICD-10-CM | POA: Diagnosis not present

## 2023-01-04 DIAGNOSIS — M2061 Acquired deformities of toe(s), unspecified, right foot: Secondary | ICD-10-CM | POA: Diagnosis not present

## 2023-01-12 DIAGNOSIS — L97512 Non-pressure chronic ulcer of other part of right foot with fat layer exposed: Secondary | ICD-10-CM | POA: Diagnosis not present

## 2023-01-19 DIAGNOSIS — E43 Unspecified severe protein-calorie malnutrition: Secondary | ICD-10-CM | POA: Diagnosis not present

## 2023-01-19 DIAGNOSIS — K3184 Gastroparesis: Secondary | ICD-10-CM | POA: Diagnosis not present

## 2023-01-21 DIAGNOSIS — M869 Osteomyelitis, unspecified: Secondary | ICD-10-CM | POA: Diagnosis not present

## 2023-01-21 DIAGNOSIS — M79674 Pain in right toe(s): Secondary | ICD-10-CM | POA: Diagnosis not present

## 2023-01-26 DIAGNOSIS — L97514 Non-pressure chronic ulcer of other part of right foot with necrosis of bone: Secondary | ICD-10-CM | POA: Diagnosis not present

## 2023-01-26 DIAGNOSIS — Z882 Allergy status to sulfonamides status: Secondary | ICD-10-CM | POA: Diagnosis not present

## 2023-01-26 DIAGNOSIS — F17210 Nicotine dependence, cigarettes, uncomplicated: Secondary | ICD-10-CM | POA: Diagnosis not present

## 2023-01-26 DIAGNOSIS — M86171 Other acute osteomyelitis, right ankle and foot: Secondary | ICD-10-CM | POA: Diagnosis not present

## 2023-01-26 DIAGNOSIS — M869 Osteomyelitis, unspecified: Secondary | ICD-10-CM | POA: Diagnosis not present

## 2023-01-26 DIAGNOSIS — L97518 Non-pressure chronic ulcer of other part of right foot with other specified severity: Secondary | ICD-10-CM | POA: Diagnosis not present

## 2023-02-03 DIAGNOSIS — Z9889 Other specified postprocedural states: Secondary | ICD-10-CM | POA: Diagnosis not present

## 2023-02-03 DIAGNOSIS — M79671 Pain in right foot: Secondary | ICD-10-CM | POA: Diagnosis not present

## 2023-02-08 DIAGNOSIS — M549 Dorsalgia, unspecified: Secondary | ICD-10-CM | POA: Diagnosis not present

## 2023-02-08 DIAGNOSIS — G894 Chronic pain syndrome: Secondary | ICD-10-CM | POA: Diagnosis not present

## 2023-02-08 DIAGNOSIS — S32000A Wedge compression fracture of unspecified lumbar vertebra, initial encounter for closed fracture: Secondary | ICD-10-CM | POA: Diagnosis not present

## 2023-02-08 DIAGNOSIS — N3010 Interstitial cystitis (chronic) without hematuria: Secondary | ICD-10-CM | POA: Diagnosis not present

## 2023-02-08 DIAGNOSIS — M4310 Spondylolisthesis, site unspecified: Secondary | ICD-10-CM | POA: Diagnosis not present

## 2023-02-08 DIAGNOSIS — R109 Unspecified abdominal pain: Secondary | ICD-10-CM | POA: Diagnosis not present

## 2023-02-08 DIAGNOSIS — Z79891 Long term (current) use of opiate analgesic: Secondary | ICD-10-CM | POA: Diagnosis not present

## 2023-02-08 DIAGNOSIS — Z1389 Encounter for screening for other disorder: Secondary | ICD-10-CM | POA: Diagnosis not present

## 2023-03-09 DIAGNOSIS — M549 Dorsalgia, unspecified: Secondary | ICD-10-CM | POA: Diagnosis not present

## 2023-03-09 DIAGNOSIS — G894 Chronic pain syndrome: Secondary | ICD-10-CM | POA: Diagnosis not present

## 2023-03-09 DIAGNOSIS — Z79891 Long term (current) use of opiate analgesic: Secondary | ICD-10-CM | POA: Diagnosis not present

## 2023-03-09 DIAGNOSIS — R109 Unspecified abdominal pain: Secondary | ICD-10-CM | POA: Diagnosis not present

## 2023-03-09 DIAGNOSIS — N3010 Interstitial cystitis (chronic) without hematuria: Secondary | ICD-10-CM | POA: Diagnosis not present

## 2023-03-09 DIAGNOSIS — S32000A Wedge compression fracture of unspecified lumbar vertebra, initial encounter for closed fracture: Secondary | ICD-10-CM | POA: Diagnosis not present

## 2023-03-09 DIAGNOSIS — M4310 Spondylolisthesis, site unspecified: Secondary | ICD-10-CM | POA: Diagnosis not present

## 2023-04-06 DIAGNOSIS — G894 Chronic pain syndrome: Secondary | ICD-10-CM | POA: Diagnosis not present

## 2023-04-06 DIAGNOSIS — R109 Unspecified abdominal pain: Secondary | ICD-10-CM | POA: Diagnosis not present

## 2023-04-06 DIAGNOSIS — Z79891 Long term (current) use of opiate analgesic: Secondary | ICD-10-CM | POA: Diagnosis not present

## 2023-04-06 DIAGNOSIS — M549 Dorsalgia, unspecified: Secondary | ICD-10-CM | POA: Diagnosis not present

## 2023-04-06 DIAGNOSIS — K3184 Gastroparesis: Secondary | ICD-10-CM | POA: Diagnosis not present

## 2023-04-06 DIAGNOSIS — N3010 Interstitial cystitis (chronic) without hematuria: Secondary | ICD-10-CM | POA: Diagnosis not present

## 2023-04-06 DIAGNOSIS — M4310 Spondylolisthesis, site unspecified: Secondary | ICD-10-CM | POA: Diagnosis not present

## 2023-04-13 DIAGNOSIS — K3184 Gastroparesis: Secondary | ICD-10-CM | POA: Diagnosis not present

## 2023-04-20 DIAGNOSIS — K3184 Gastroparesis: Secondary | ICD-10-CM | POA: Diagnosis not present

## 2023-04-20 DIAGNOSIS — E43 Unspecified severe protein-calorie malnutrition: Secondary | ICD-10-CM | POA: Diagnosis not present

## 2023-04-22 DIAGNOSIS — E43 Unspecified severe protein-calorie malnutrition: Secondary | ICD-10-CM | POA: Diagnosis not present

## 2023-04-22 DIAGNOSIS — Z431 Encounter for attention to gastrostomy: Secondary | ICD-10-CM | POA: Diagnosis not present

## 2023-04-22 DIAGNOSIS — K3184 Gastroparesis: Secondary | ICD-10-CM | POA: Diagnosis not present

## 2023-04-22 DIAGNOSIS — K9423 Gastrostomy malfunction: Secondary | ICD-10-CM | POA: Diagnosis not present

## 2023-04-27 DIAGNOSIS — K3184 Gastroparesis: Secondary | ICD-10-CM | POA: Diagnosis not present

## 2023-05-05 DIAGNOSIS — M549 Dorsalgia, unspecified: Secondary | ICD-10-CM | POA: Diagnosis not present

## 2023-05-05 DIAGNOSIS — N3010 Interstitial cystitis (chronic) without hematuria: Secondary | ICD-10-CM | POA: Diagnosis not present

## 2023-05-05 DIAGNOSIS — M4310 Spondylolisthesis, site unspecified: Secondary | ICD-10-CM | POA: Diagnosis not present

## 2023-05-05 DIAGNOSIS — R109 Unspecified abdominal pain: Secondary | ICD-10-CM | POA: Diagnosis not present

## 2023-05-05 DIAGNOSIS — G894 Chronic pain syndrome: Secondary | ICD-10-CM | POA: Diagnosis not present

## 2023-05-05 DIAGNOSIS — Z79891 Long term (current) use of opiate analgesic: Secondary | ICD-10-CM | POA: Diagnosis not present

## 2023-05-11 DIAGNOSIS — G894 Chronic pain syndrome: Secondary | ICD-10-CM | POA: Diagnosis not present

## 2023-05-11 DIAGNOSIS — M4310 Spondylolisthesis, site unspecified: Secondary | ICD-10-CM | POA: Diagnosis not present

## 2023-05-11 DIAGNOSIS — R109 Unspecified abdominal pain: Secondary | ICD-10-CM | POA: Diagnosis not present

## 2023-05-11 DIAGNOSIS — Z79891 Long term (current) use of opiate analgesic: Secondary | ICD-10-CM | POA: Diagnosis not present

## 2023-05-11 DIAGNOSIS — N3010 Interstitial cystitis (chronic) without hematuria: Secondary | ICD-10-CM | POA: Diagnosis not present

## 2023-05-11 DIAGNOSIS — M549 Dorsalgia, unspecified: Secondary | ICD-10-CM | POA: Diagnosis not present

## 2023-06-16 DIAGNOSIS — Z79891 Long term (current) use of opiate analgesic: Secondary | ICD-10-CM | POA: Diagnosis not present

## 2023-06-16 DIAGNOSIS — M5459 Other low back pain: Secondary | ICD-10-CM | POA: Diagnosis not present

## 2023-07-20 DIAGNOSIS — M549 Dorsalgia, unspecified: Secondary | ICD-10-CM | POA: Diagnosis not present

## 2023-07-20 DIAGNOSIS — Z79891 Long term (current) use of opiate analgesic: Secondary | ICD-10-CM | POA: Diagnosis not present

## 2023-07-20 DIAGNOSIS — G894 Chronic pain syndrome: Secondary | ICD-10-CM | POA: Diagnosis not present

## 2023-08-04 DIAGNOSIS — Z931 Gastrostomy status: Secondary | ICD-10-CM | POA: Diagnosis not present

## 2023-08-04 DIAGNOSIS — E43 Unspecified severe protein-calorie malnutrition: Secondary | ICD-10-CM | POA: Diagnosis not present

## 2023-08-20 DIAGNOSIS — Z79891 Long term (current) use of opiate analgesic: Secondary | ICD-10-CM | POA: Diagnosis not present

## 2023-08-20 DIAGNOSIS — M549 Dorsalgia, unspecified: Secondary | ICD-10-CM | POA: Diagnosis not present

## 2023-08-25 DIAGNOSIS — M79671 Pain in right foot: Secondary | ICD-10-CM | POA: Diagnosis not present

## 2023-08-25 DIAGNOSIS — M79673 Pain in unspecified foot: Secondary | ICD-10-CM | POA: Diagnosis not present

## 2023-08-25 DIAGNOSIS — M792 Neuralgia and neuritis, unspecified: Secondary | ICD-10-CM | POA: Diagnosis not present

## 2023-08-25 DIAGNOSIS — M21961 Unspecified acquired deformity of right lower leg: Secondary | ICD-10-CM | POA: Diagnosis not present

## 2023-09-10 DIAGNOSIS — Z79899 Other long term (current) drug therapy: Secondary | ICD-10-CM | POA: Diagnosis not present

## 2023-09-10 DIAGNOSIS — M545 Low back pain, unspecified: Secondary | ICD-10-CM | POA: Diagnosis not present

## 2023-09-10 DIAGNOSIS — E43 Unspecified severe protein-calorie malnutrition: Secondary | ICD-10-CM | POA: Diagnosis not present

## 2023-09-10 DIAGNOSIS — J439 Emphysema, unspecified: Secondary | ICD-10-CM | POA: Diagnosis not present

## 2023-09-10 DIAGNOSIS — K029 Dental caries, unspecified: Secondary | ICD-10-CM | POA: Diagnosis not present

## 2023-10-12 DIAGNOSIS — N3010 Interstitial cystitis (chronic) without hematuria: Secondary | ICD-10-CM | POA: Diagnosis not present

## 2023-10-12 DIAGNOSIS — Z934 Other artificial openings of gastrointestinal tract status: Secondary | ICD-10-CM | POA: Diagnosis not present

## 2023-10-12 DIAGNOSIS — J8489 Other specified interstitial pulmonary diseases: Secondary | ICD-10-CM | POA: Diagnosis not present

## 2024-02-07 IMAGING — XA IR FISTULA/SINUS TRACT
12 of 16 series · 12 of 24 positions shown · non-contrast
Comparison: None available

MEDICATIONS:
None

ANESTHESIA/SEDATION:
None

COMPLICATIONS:
None immediate.

INDICATION: 51-year-old woman with prior history of jejunostomy tube status post
recent removal. She continues head at have leakage through the
jejunostomy site.

EXAM:
SINUS TRACT INJECTION / FISTULOGRAM
TECHNIQUE: Informed written consent was obtained from the patient after a
thorough discussion of the procedural risks, benefits and
alternatives. All questions were addressed. A timeout was performed
prior to the initiation of the procedure.

[Series 2: fl (-) angio · 1 of 17 frames shown (1 of 12)]
[frame 9/17]
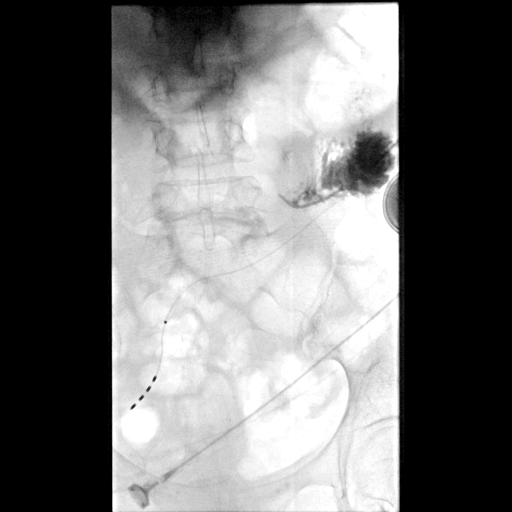

[Series 3: fl (-) angio · 1 of 11 frames shown (2 of 12)]
[frame 10/11]
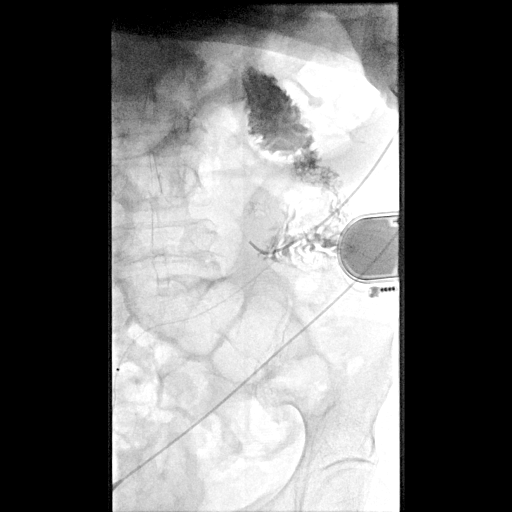

[Series 4: fl (-) angio · 1 of 33 frames shown (3 of 12)]
[frame 32/33]
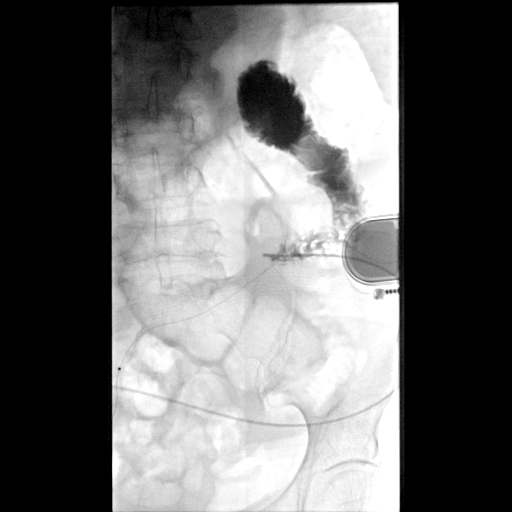

[Series 5: fl (-) angio · 1 of 19 frames shown (4 of 12)]
[frame 17/19]
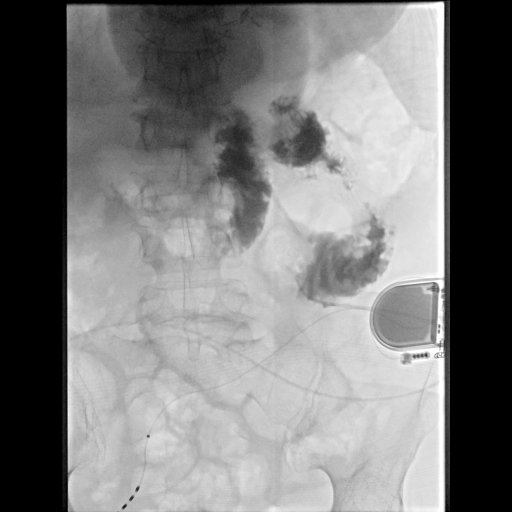

[Series 7: fl (-) angio · 1 of 29 frames shown (5 of 12)]
[frame 5/29]
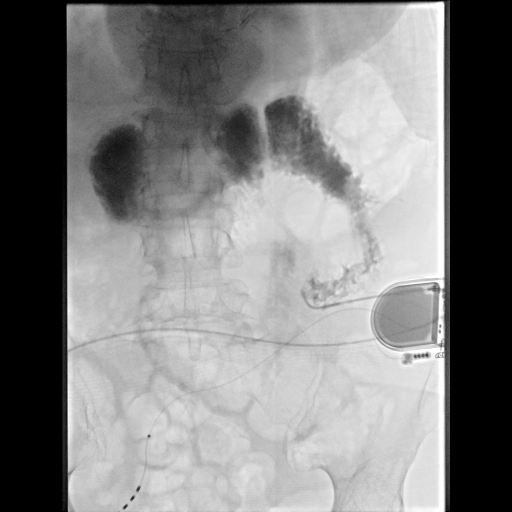

[Series 8: fl (-) angio · 1 of 19 frames shown (6 of 12)]
[frame 3/19]
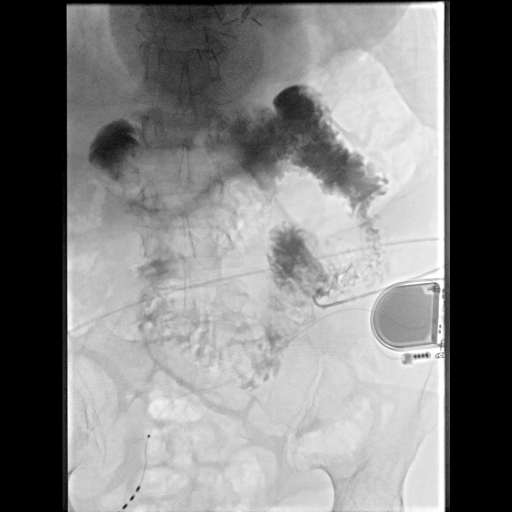

[Series 9: fl (-) angio · 1 of 35 frames shown (7 of 12)]
[frame 6/35]
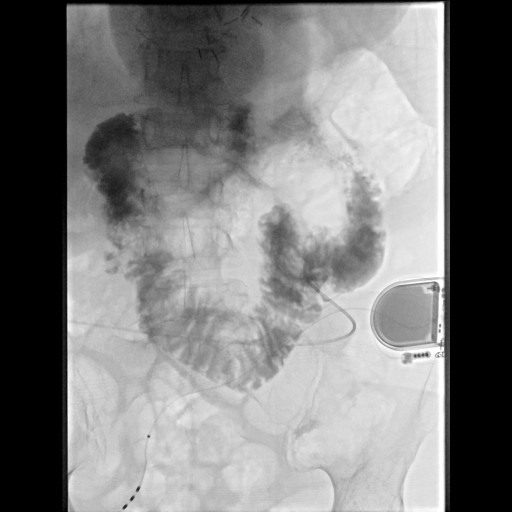

[Series 11: fl (-) angio · 1 of 21 frames shown (8 of 12)]
[frame 4/21]
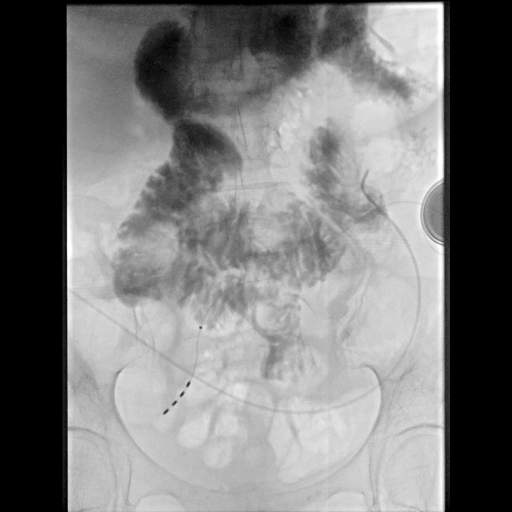

[Series 12: fl (-) angio · 1 of 1 slices shown (9 of 12)]
[im 1/1]
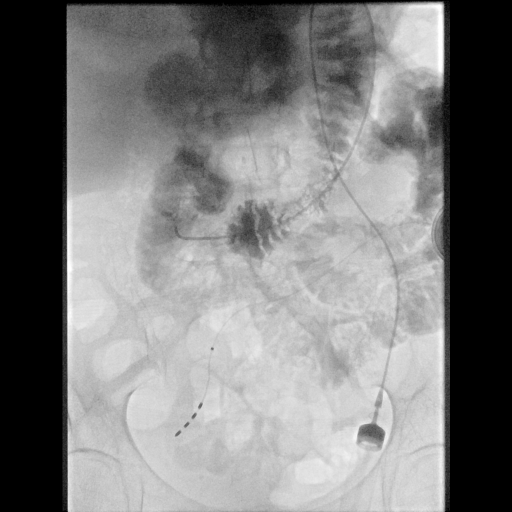

[Series 16: fl (-) angio · 1 of 1 slices shown (10 of 12)]
[im 1/1]
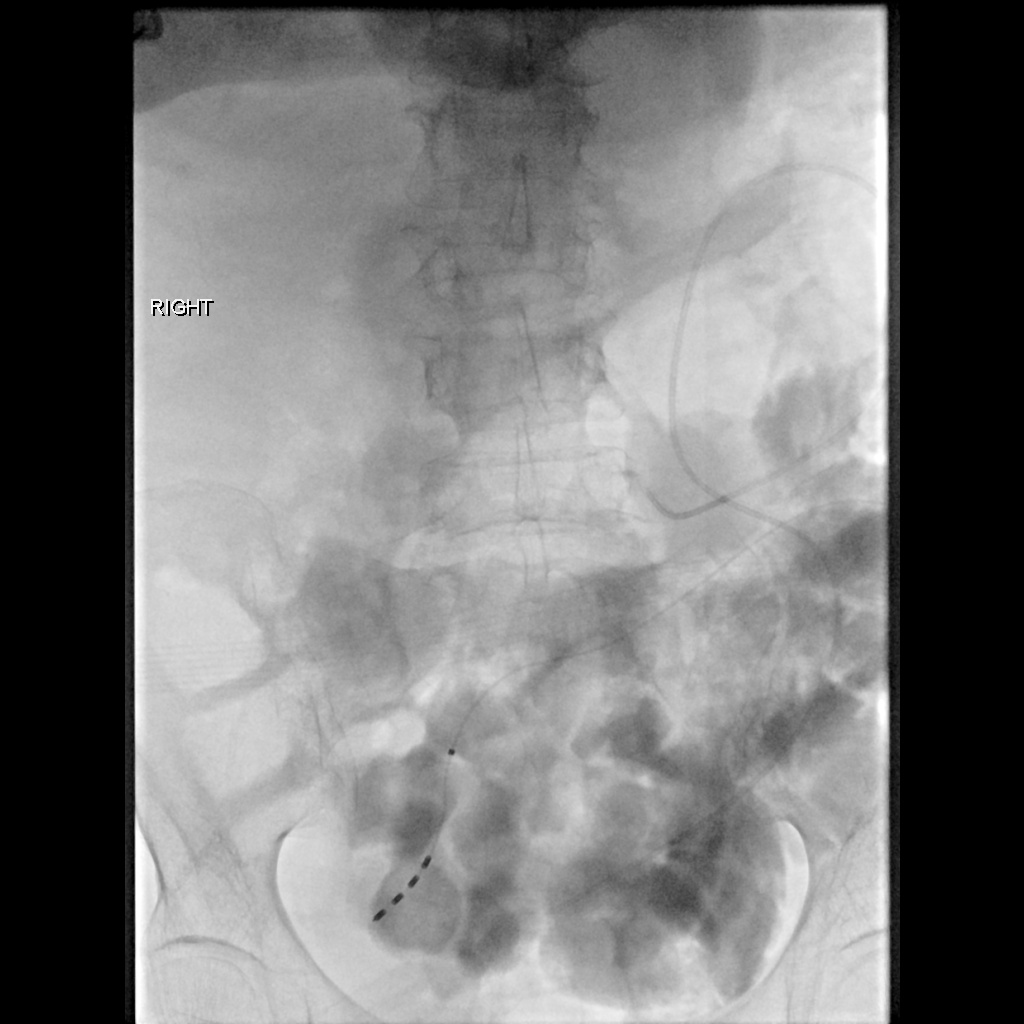

[Series 19: fl (-) angio · 1 of 1 slices shown (11 of 12)]
[im 1/1]
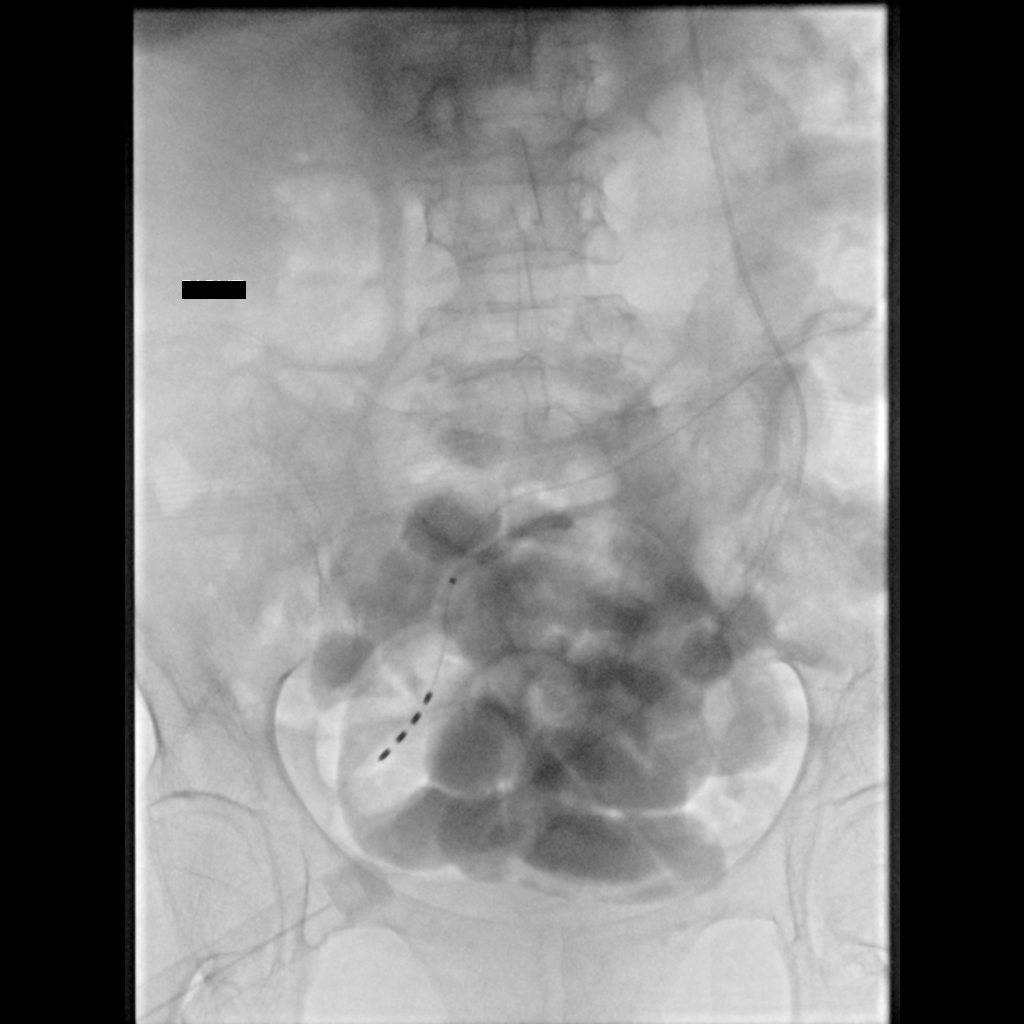

[Series 20: fl (-) angio · 1 of 11 frames shown (12 of 12)]
[frame 10/11]
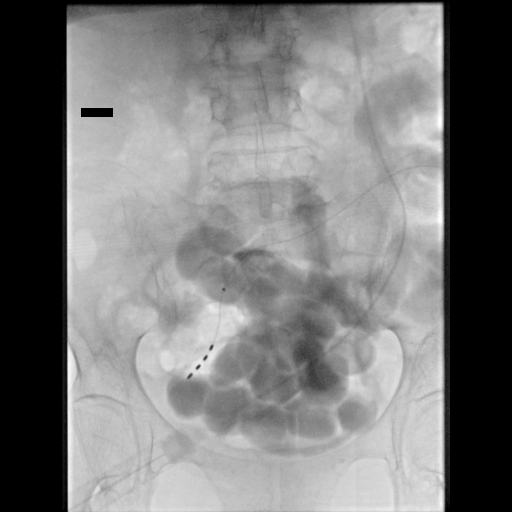

[12 of 24 positions shown; findings below may reference images not displayed]

PROCEDURE:
Kumpe catheter was placed through the jejunostomy opening and
contrast administered under fluoroscopy. Initially the contrast
traveled retrograde to the proximal jejunum and eventually the
duodenum. With additional contrast and saline administration, the
contrast flowed antegrade into the jejunum and eventually the ileum.
The contrast had not reached the right colon at 45 minutes in the
patient did not wish to continue the exam.

No significant small bowel dilatation to indicate ileus or
obstruction.
IMPRESSION: Contrast administered through jejunostomy opening flowed to the
ileum without difficulty. No high-grade obstruction or ileus
identified. The contrast had not reached the right colon at the end
of the study. The patient did not wish to continue with the
examination therefore it was terminated prior to contrast reaching
right colon.
